# Patient Record
Sex: Female | Born: 1965 | Race: Black or African American | Hispanic: No | Marital: Single | Smoking: Current every day smoker
Health system: Southern US, Community
[De-identification: ages and names within clinical notes are randomized; demographics above are authoritative.]

## PROBLEM LIST (undated history)

## (undated) DIAGNOSIS — F32A Depression, unspecified: Secondary | ICD-10-CM

## (undated) DIAGNOSIS — R42 Dizziness and giddiness: Secondary | ICD-10-CM

## (undated) DIAGNOSIS — G510 Bell's palsy: Secondary | ICD-10-CM

## (undated) DIAGNOSIS — F419 Anxiety disorder, unspecified: Secondary | ICD-10-CM

## (undated) DIAGNOSIS — R9389 Abnormal findings on diagnostic imaging of other specified body structures: Secondary | ICD-10-CM

## (undated) DIAGNOSIS — I639 Cerebral infarction, unspecified: Secondary | ICD-10-CM

## (undated) DIAGNOSIS — D250 Submucous leiomyoma of uterus: Secondary | ICD-10-CM

## (undated) DIAGNOSIS — I10 Essential (primary) hypertension: Secondary | ICD-10-CM

## (undated) DIAGNOSIS — E119 Type 2 diabetes mellitus without complications: Secondary | ICD-10-CM

## (undated) DIAGNOSIS — D219 Benign neoplasm of connective and other soft tissue, unspecified: Secondary | ICD-10-CM

## (undated) DIAGNOSIS — D649 Anemia, unspecified: Secondary | ICD-10-CM

## (undated) DIAGNOSIS — Z8669 Personal history of other diseases of the nervous system and sense organs: Secondary | ICD-10-CM

## (undated) DIAGNOSIS — T7840XA Allergy, unspecified, initial encounter: Secondary | ICD-10-CM

## (undated) DIAGNOSIS — H409 Unspecified glaucoma: Secondary | ICD-10-CM

## (undated) DIAGNOSIS — Z972 Presence of dental prosthetic device (complete) (partial): Secondary | ICD-10-CM

## (undated) HISTORY — DX: Bell's palsy: G51.0

## (undated) HISTORY — DX: Cerebral infarction, unspecified: I63.9

## (undated) HISTORY — PX: BREAST SURGERY: SHX581

## (undated) HISTORY — DX: Unspecified glaucoma: H40.9

## (undated) HISTORY — DX: Essential (primary) hypertension: I10

## (undated) HISTORY — DX: Anemia, unspecified: D64.9

## (undated) HISTORY — DX: Anxiety disorder, unspecified: F41.9

## (undated) HISTORY — PX: REFRACTIVE SURGERY: SHX103

## (undated) HISTORY — PX: PELVIC LAPAROSCOPY: SHX162

## (undated) HISTORY — DX: Allergy, unspecified, initial encounter: T78.40XA

## (undated) HISTORY — PX: BREAST EXCISIONAL BIOPSY: SUR124

## (undated) HISTORY — DX: Benign neoplasm of connective and other soft tissue, unspecified: D21.9

## (undated) HISTORY — DX: Depression, unspecified: F32.A

## (undated) HISTORY — DX: Dizziness and giddiness: R42

---

## 1998-06-30 ENCOUNTER — Other Ambulatory Visit: Admission: RE | Admit: 1998-06-30 | Discharge: 1998-06-30 | Payer: Self-pay | Admitting: Gynecology

## 1999-03-28 ENCOUNTER — Other Ambulatory Visit: Admission: RE | Admit: 1999-03-28 | Discharge: 1999-03-28 | Payer: Self-pay | Admitting: Gynecology

## 2000-07-30 HISTORY — PX: HYSTEROSCOPY: SHX211

## 2000-12-05 ENCOUNTER — Other Ambulatory Visit: Admission: RE | Admit: 2000-12-05 | Discharge: 2000-12-05 | Payer: Self-pay | Admitting: Gynecology

## 2001-01-02 HISTORY — PX: HYSTEROSCOPY WITH MYOMECTOMY: SHX7591

## 2001-01-22 ENCOUNTER — Encounter (INDEPENDENT_AMBULATORY_CARE_PROVIDER_SITE_OTHER): Payer: Self-pay

## 2001-01-22 ENCOUNTER — Ambulatory Visit (HOSPITAL_COMMUNITY): Admission: RE | Admit: 2001-01-22 | Discharge: 2001-01-22 | Payer: Self-pay | Admitting: Gynecology

## 2001-05-02 ENCOUNTER — Emergency Department (HOSPITAL_COMMUNITY): Admission: EM | Admit: 2001-05-02 | Discharge: 2001-05-03 | Payer: Self-pay | Admitting: *Deleted

## 2003-02-17 ENCOUNTER — Other Ambulatory Visit: Admission: RE | Admit: 2003-02-17 | Discharge: 2003-02-17 | Payer: Self-pay | Admitting: Gynecology

## 2003-09-18 ENCOUNTER — Emergency Department (HOSPITAL_COMMUNITY): Admission: EM | Admit: 2003-09-18 | Discharge: 2003-09-18 | Payer: Self-pay | Admitting: Emergency Medicine

## 2004-06-30 ENCOUNTER — Other Ambulatory Visit: Admission: RE | Admit: 2004-06-30 | Discharge: 2004-06-30 | Payer: Self-pay | Admitting: Gynecology

## 2004-08-11 ENCOUNTER — Ambulatory Visit (HOSPITAL_COMMUNITY): Admission: RE | Admit: 2004-08-11 | Discharge: 2004-08-11 | Payer: Self-pay | Admitting: Gynecology

## 2004-10-27 ENCOUNTER — Emergency Department (HOSPITAL_COMMUNITY): Admission: EM | Admit: 2004-10-27 | Discharge: 2004-10-27 | Payer: Self-pay | Admitting: Emergency Medicine

## 2005-07-13 ENCOUNTER — Other Ambulatory Visit: Admission: RE | Admit: 2005-07-13 | Discharge: 2005-07-13 | Payer: Self-pay | Admitting: Gynecology

## 2005-10-28 HISTORY — PX: MYOMECTOMY: SHX85

## 2005-11-09 ENCOUNTER — Inpatient Hospital Stay (HOSPITAL_COMMUNITY): Admission: RE | Admit: 2005-11-09 | Discharge: 2005-11-11 | Payer: Self-pay | Admitting: Gynecology

## 2005-11-09 ENCOUNTER — Encounter (INDEPENDENT_AMBULATORY_CARE_PROVIDER_SITE_OTHER): Payer: Self-pay | Admitting: Specialist

## 2005-11-09 HISTORY — PX: EXPLORATORY LAPAROTOMY: SUR591

## 2007-10-31 ENCOUNTER — Other Ambulatory Visit: Admission: RE | Admit: 2007-10-31 | Discharge: 2007-10-31 | Payer: Self-pay | Admitting: Gynecology

## 2008-11-05 ENCOUNTER — Other Ambulatory Visit: Admission: RE | Admit: 2008-11-05 | Discharge: 2008-11-05 | Payer: Self-pay | Admitting: Gynecology

## 2008-11-05 ENCOUNTER — Encounter: Payer: Self-pay | Admitting: Gynecology

## 2008-11-05 ENCOUNTER — Ambulatory Visit: Payer: Self-pay | Admitting: Gynecology

## 2008-11-12 ENCOUNTER — Ambulatory Visit: Payer: Self-pay | Admitting: Gynecology

## 2008-11-19 ENCOUNTER — Ambulatory Visit (HOSPITAL_COMMUNITY): Admission: RE | Admit: 2008-11-19 | Discharge: 2008-11-19 | Payer: Self-pay | Admitting: Gynecology

## 2008-11-26 ENCOUNTER — Encounter: Admission: RE | Admit: 2008-11-26 | Discharge: 2008-11-26 | Payer: Self-pay | Admitting: Gynecology

## 2009-05-06 ENCOUNTER — Encounter: Admission: RE | Admit: 2009-05-06 | Discharge: 2009-05-06 | Payer: Self-pay | Admitting: Gynecology

## 2009-11-25 ENCOUNTER — Encounter: Admission: RE | Admit: 2009-11-25 | Discharge: 2009-11-25 | Payer: Self-pay | Admitting: Gynecology

## 2009-12-16 ENCOUNTER — Ambulatory Visit: Payer: Self-pay | Admitting: Gynecology

## 2009-12-16 ENCOUNTER — Other Ambulatory Visit: Admission: RE | Admit: 2009-12-16 | Discharge: 2009-12-16 | Payer: Self-pay | Admitting: Gynecology

## 2010-05-02 ENCOUNTER — Encounter: Admission: RE | Admit: 2010-05-02 | Discharge: 2010-05-02 | Payer: Self-pay | Admitting: Gynecology

## 2010-10-23 ENCOUNTER — Other Ambulatory Visit: Payer: Self-pay | Admitting: Gynecology

## 2010-10-23 DIAGNOSIS — Z09 Encounter for follow-up examination after completed treatment for conditions other than malignant neoplasm: Secondary | ICD-10-CM

## 2010-12-15 NOTE — H&P (Signed)
[facility]  Patient:    PRABHJOT, MADDUX                     MRN: [id number] Adm. Date:  01/22/01 Attending:  Marcial Pacas P. Fontaine, M.D.                         History and Physical  CHIEF COMPLAINT:              Menorrhagia.  HISTORY OF PRESENT ILLNESS:   [age]-year-old G1, P60 female with history of heavy periods.  She notes that she flows for five days each month with frequent tampon changes and she underwent ultrasound with sonohysterogram, Dec 25, 2000, which showed multiple small myomas and endometrial cavity with some fluid within outlining submucous myomas changes within the cavity. Patient is admitted at this time for sonohysterogram and resection of any intracavitary defects.  PAST MEDICAL HISTORY:         Uncomplicated.  PAST SURGICAL HISTORY:        Uncomplicated.  ALLERGIES:                    No medications.  CURRENT MEDICATIONS:          None.  FAMILY HISTORY:               Noncontributory.  SOCIAL HISTORY:               Cigarettes:  One-half pack per day.  Alcohol: None.  PHYSICAL EXAMINATION:  HEENT:                        Normal.  LUNGS:                        Clear.  CARDIAC:                      Regular rate without rubs, murmurs or gallops.  ABDOMEN:                      Exam benign.  PELVIC:                       External, BUS, vagina normal.  Cervix normal. Uterus retroverted, grossly normal in size, nodular.  Adnexa without masses or tenderness.  ASSESSMENT:                   [age]-year-old gravida 1, para 0 female, history of leiomyomata, with multiple small intramural myomas with menorrhagia and ultrasound revealing fluid within the cavity which outlines irregularities consistent with submucous myomas.  Patient is admitted for hysteroscopy and resection of any intracavitary defects.  I have reviewed with the patient what is involved with hysteroscopy to include instrumentation and the risks of  the procedure to include uterine perforation, inadvertent injury to internal organs including bowel, bladder, ureters, vessels and nerves necessitating major exploratory reparative surgeries and future reparative surgeries including ostomy formation.  The risk of a hemorrhage necessitating transfusion and the risks of transfusion were discussed with her.  The risks of infection were also reviewed with her.  Patient understands that we are not removing all of her fibroids but only those areas that protrude into the cavity and she understands and accepts this.  The patients questions were answered to her satisfaction and she is ready to proceed  with surgery. DD:  01/20/01 TD:  01/20/01 Job: [id number]

## 2010-12-15 NOTE — Op Note (Signed)
[facility]  Patient:    KYLAH, MARESH                     MRN: [id number] Proc. Date: 01/22/01 Attending:  Marcial Pacas P. Fontaine, M.D.                           Operative Report  PREOPERATIVE DIAGNOSIS:       Leiomyomata.  POSTOPERATIVE DIAGNOSIS:      1. Leiomyomata.                               2. Questionable polyp.  OPERATION:                    Hysteroscopic myomectomy.  SURGEON:                      Timothy P. Fontaine, M.D.  ANESTHESIA:                   General.  ESTIMATED BLOOD LOSS:         Minimal  COMPLICATIONS:                None.  SORBITOL DISCREPANCY:       Less than 150 cc.  FINDINGS:                     Small pedunculated myoma, left anterior lower cavity, excised entirely.  Endometrial distortion from submucous myoma, mid anterior wall, excised to the level of the surrounding endometrium.  Small polypoid growth, right endometrial wall, excised.  Cavity otherwise normal. Right and left tubal ostia visualized.  Lower uterine segment and cervical canal all normal.  DESCRIPTION OF PROCEDURE:     The patient was taken to the operating room and initially underwent IV sedation.  She was unable to tolerate the vaginal preparation and subsequently underwent general anesthesia.  The patient subsequently underwent vaginal peritoneal preparation with Betadine solution. Bladder emptied with in and out Foley catheterization.  Examination under anesthesia performed.    The patient was draped in the usual fashion. The cervix was visualized with surgical speculum, grasped on the anterior lip with a single-tooth tenaculum and gently dilated to admit the operative resectoscope.  Hysteroscopy was performed with findings noted above.   The pedunculated myoma was removed with a single pass of the right angle resectoscopic loop and sent to pathology.  The bulging submucous myoma anteriorly was addressed, and, through several passes, the  superficial portion of the myoma encroaching upon the cavity was resected to the level of the surrounding endometrium.  There was no further protuberance of the underlying myoma, and further resection was abandoned as would requireentry into the myometrial wall.  The polypoid endometrial protuberance on the right cavity was then removed and sent separately to pathology.  The hysteroscope was then removed.  The sharp curettage performed.  The endometrium retrieved and sent to pathology.  Repeat hysteroscopy showed andempty cavity, good distension, and no evidence of perforation, and adequate hemostasis.  The instruments were then removed.  Adequate hemostasis was visualized.  The patient was placed in the supine position and awakened without difficulty and taken to the recovery room in good condition having tolerated the procedure well. DD:  01/22/01 TD:  01/22/01 Job: [id number]

## 2010-12-15 NOTE — Op Note (Signed)
NAMECRUZITA, LIPA            ACCOUNT NO.:  [id number]   MEDICAL RECORD NO.:  [id number]          PATIENT TYPE:  INP   LOCATION:  9399                          FACILITY:  [facility]   PHYSICIAN:  Timothy P. Fontaine, M.D.DATE OF BIRTH:  04/19/1966   DATE OF PROCEDURE:  11/09/2005  DATE OF DISCHARGE:                                 OPERATIVE REPORT   PREOPERATIVE DIAGNOSIS:  Multiple myomas.   POSTOPERATIVE DIAGNOSIS:  Multiple myomas.   PROCEDURE:  Exploratory laparotomy, multiple myomectomy.   SURGEON:  Timothy P. Fontaine, M.D.   ASSISTANT:  Rande Brunt. Eda Paschal, M.D.   ANESTHETIC:  General.   ESTIMATED BLOOD LOSS:  175 mL.   COMPLICATIONS:  None.   SPECIMEN:  Multiple myomas, number 13.   FINDINGS:  Uterus grossly enlarged approximately 14 weeks size with multiple  myomas intramural subserosal ranging in size from 5 cm to several  millimeters.  Number 13 were removed in total.  No remaining myomas noted.  Right and left fallopian tubes are normal length, caliber, fimbriated ends  free and mobile.  Right and left ovaries grossly normal free and mobile.  No  evidence of the pelvic adhesions or endometriosis.  Palpable upper abdominal  exam was noted to be normal.  The endometrial cavity was not entered during  the case. Surgeons opinion that the patient should have subsequent cesarean  section with delivery if pregnancy is achieved.   PROCEDURE:  The patient was taken to the operating room, underwent general  endotracheal anesthesia, received abdominal perineal vaginal preparation  with Betadine solution.  The bladder emptied with indwelling Foley catheter  placed in sterile technique.  The patient is a latex allergic and latex  precautions were used.  The patient was draped in usual fashion.  The  abdomen sharply entered through a Pfannenstiel incision achieving adequate  hemostasis at all levels.  Balfour retractor and bladder blade were placed  within the incision and  the intestines were packed from the operative field.  The uterus was elevated and using a mixture of vasopressin 20 units per 50  mL of normal saline, the uterus was injected around all myoma sites a total  of 20 mL.  There were two large fundal subserosal myomas noted and a  transverse fundal incision was made to remove both of these myomas.  Subsequently, a lower uterine segment posterior myoma was also noted with  several smaller surrounding myomas.  A subsequent longitudinal lower uterine  segment posterior incision was made overlying the myoma and this myoma was  removed as well as the surrounding a smaller myomas.  A third smaller  anterior fundal incision was made overlying a 2 cm myoma as it was not  retrievable from either of the prior two incisions.  Of note tubal  insertions bilaterally were not compromised and there were several myomas  surrounding the tubal insertions that could explain extrinsic tubal  compression.  The uterine incisions were then closed initially with 0 Vicryl  interrupted figure-of-eight myometrial sutures and subsequently, the serosa  was reapproximated using 3-0 PDS in a running subserosal stitch bearing the  sutures.  The pelvis was copiously irrigated.  Adequate hemostasis was  achieved, bowel packing removed.  Balfour retractor bladder blade removed.  The anterior peritoneum was reapproximated using 3-0 Vicryl running stitch.  The subcutaneous tissues were irrigated out, good hemostasis visualized and  the fascia was reapproximated using 0 Vicryl suture starting at the angle,  meeting in the middle.  Electrocautery was used for subcutaneous hemostasis.  Skin was then reapproximated using 4-0 Vicryl running subcuticular stitch.  Steri-Strips, Benzoin applied.  The patient awakened without difficulty,  taken to recovery room in good condition having tolerated the procedure  well.  The dye study was not performed during the procedure given there was   evidence of obstruction preoperatively bilaterally it was felt not to  contribute ultimately.      Timothy P. Fontaine, M.D.  Electronically Signed     TPF/MEDQ  D:  11/09/2005  T:  11/09/2005  Job:  [id number]

## 2010-12-15 NOTE — H&P (Signed)
Brandi Cooke, Brandi Cooke            ACCOUNT NO.:  [id number]   MEDICAL RECORD NO.:  [id number]          PATIENT TYPE:  INP   LOCATION:  NA                            FACILITY:  [facility]   PHYSICIAN:  Timothy P. Fontaine, M.D.DATE OF BIRTH:  04/25/1966   DATE OF ADMISSION:  11/10/2003  DATE OF DISCHARGE:                                HISTORY & PHYSICAL   CHIEF COMPLAINT:  Menorrhagia with dysmenorrhea.   HISTORY OF PRESENT ILLNESS:  A 45 year old, G1, P32 female with increasing  menorrhagia and dysmenorrhea.  The patient has been followed for history of  leiomyomata and has worsening symptoms in relationship to her bleeding and  cramping.  She has been attempting pregnancy for the past several years.  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  The patient is admitted at this time for myomectomy due to her  menorrhagia and dysmenorrhea.   PAST MEDICAL HISTORY:  Uncomplicated.   PAST SURGICAL HISTORY:  Hysteroscopic myomectomy.   ALLERGIES:  No known drug allergies.   CURRENT MEDICATIONS:  None.   REVIEW OF SYSTEMS:  Noncontributory.   FAMILY HISTORY:  Noncontributory.   SOCIAL HISTORY:  Noncontributory.   PHYSICAL EXAMINATION:  VITAL SIGNS:  Afebrile.  Vital signs stable.  HEENT:  Normal.  LUNGS:  Clear.  CARDIAC:  Regular rate without rubs, murmurs or gallops.  ABDOMEN:  Abdominal exam is benign.  PELVIC:  External BUS and vagina normal.  Cervix normal.  Uterus bulky  retroverted consistent with myomas.  Adnexa without gross masses or  tenderness.   ASSESSMENT:  A 45 year old, G1, P24 female with history of leiomyomata with  increasing menorrhagia and dysmenorrhea for multiple myomectomy.  Risks  benefits, indications and alternatives for the proposed surgery were  reviewed with her to include the options of continued expectant management,  multiple myomectomy, hysterectomy as well as uterine artery embolization.   PLAN:  The patient has  been trying to get pregnant, although she understands  that she has bilateral tubal occlusion.  The options for referral to  reproductive endocrinologist to consider in vitro versus other surgery such  as multiple myomectomy, tuboplasty were reviewed and declined.  The patient  understands by performing myomectomy that she is at risk for redevelopment  of myomas in the future.  She does have a posterior lower uterine segment  myoma and she understands that I may not be able to remove all the myomas at  the time of the myomectomy safely.  She also understands that if  complications arise during the procedure, that I  may have to proceed with  hysterectomy and she has a realistic understanding that I may have to  perform a hysterectomy at the time of her myomectomy.  She again understands  that this is not being performed for fertility, that she will continue to  have tubal occlusion following the procedure and that we are addressing her  myomas for her menorrhagia and dysmenorrhea standpoint and she understands  and accepts this.  The acute intraoperative, postoperative risks of the  surgery were reviewed with her to include the risks of  bleeding, hemorrhage  and transfusion and the risks of transfusion including transfusion reaction,  hepatitis, HIV, mad cow disease and other unknown entities.  The risks of  infection both internal requiring prolonged antibiotics as well as  incisional, requiring opening and draining of incisions, closure by  secondary intention and other wound complications were all discussed,  understood and accepted.  The risks of inadvertent injury to internal organs  including bowel, bladder, ureters, vessels and nerves necessitating major  exploratory reparative surgeries in the future with reparative surgeries  including ostomy formation was all discussed, understood, accepted.  The  patient understands that if she does achieve pregnancy following the  procedure in  the future, either spontaneously or through assistance, that  due to the myomectomy, she is at risk for subsequent pregnancies from a  uterine rupture standpoint as well as probably requiring cesarean section  delivery.  She also understands the scarring due to the myomectomy may also  further impair fertility and she understands and accepts this.  The  patient's questions were answered to her satisfaction.  She is ready to  proceed with surgery.      Timothy P. Fontaine, M.D.  Electronically Signed     TPF/MEDQ  D:  10/26/2005  T:  10/27/2005  Job:  [id number]

## 2012-05-27 ENCOUNTER — Encounter: Payer: Self-pay | Admitting: Gynecology

## 2012-05-27 DIAGNOSIS — H409 Unspecified glaucoma: Secondary | ICD-10-CM | POA: Insufficient documentation

## 2012-05-30 ENCOUNTER — Other Ambulatory Visit (HOSPITAL_COMMUNITY)
Admission: RE | Admit: 2012-05-30 | Discharge: 2012-05-30 | Disposition: A | Discharge status: Home / Self Care | Payer: Medicaid Other | Source: Ambulatory Visit | Attending: Gynecology | Admitting: Gynecology

## 2012-05-30 ENCOUNTER — Ambulatory Visit (INDEPENDENT_AMBULATORY_CARE_PROVIDER_SITE_OTHER): Payer: Medicaid Other | Admitting: Gynecology

## 2012-05-30 ENCOUNTER — Encounter: Payer: Self-pay | Admitting: Gynecology

## 2012-05-30 VITALS — BP 124/76 | Ht 65.0 in | Wt 140.0 lb

## 2012-05-30 DIAGNOSIS — Z1151 Encounter for screening for human papillomavirus (HPV): Secondary | ICD-10-CM | POA: Insufficient documentation

## 2012-05-30 DIAGNOSIS — D259 Leiomyoma of uterus, unspecified: Secondary | ICD-10-CM

## 2012-05-30 DIAGNOSIS — E010 Iodine-deficiency related diffuse (endemic) goiter: Secondary | ICD-10-CM

## 2012-05-30 DIAGNOSIS — Z01419 Encounter for gynecological examination (general) (routine) without abnormal findings: Secondary | ICD-10-CM | POA: Insufficient documentation

## 2012-05-30 DIAGNOSIS — G510 Bell's palsy: Secondary | ICD-10-CM | POA: Insufficient documentation

## 2012-05-30 DIAGNOSIS — E049 Nontoxic goiter, unspecified: Secondary | ICD-10-CM

## 2012-05-30 DIAGNOSIS — Z124 Encounter for screening for malignant neoplasm of cervix: Secondary | ICD-10-CM

## 2012-05-30 DIAGNOSIS — D219 Benign neoplasm of connective and other soft tissue, unspecified: Secondary | ICD-10-CM | POA: Insufficient documentation

## 2012-05-30 NOTE — Patient Instructions (Addendum)
Follow up for ultrasound  Call to Schedule your mammogram  Facilities in [place]: 1)  [facility] [place], [place] Phone: [phone] 2)  [facility]. [facility], [phone] Phone: [phone] 3)  Dr. Yolanda Bonine at [facility] [address] Phone: [phone]     Mammogram A mammogram is an X-ray test to find changes in a woman's breast. You should get a mammogram if:  You are 86 years of age or older  You have risk factors.   Your doctor recommends that you have one.  BEFORE THE TEST  Do not schedule the test the week before your period, especially if your breasts are sore during this time.  On the day of your mammogram:  Wash your breasts and armpits well. After washing, do not put on any deodorant or talcum powder on until after your test.   Eat and drink as you usually do.   Take your medicines as usual.   If you are diabetic and take insulin, make sure you:   Eat before coming for your test.   Take your insulin as usual.   If you cannot keep your appointment, call before the appointment to cancel. Schedule another appointment.  TEST  You will need to undress from the waist up. You will put on a hospital gown.   Your breast will be put on the mammogram machine, and it will press firmly on your breast with a piece of plastic called a compression paddle. This will make your breast flatter so that the machine can X-ray all parts of your breast.   Both breasts will be X-rayed. Each breast will be X-rayed from above and from the side. An X-ray might need to be taken again if the picture is not good enough.   The mammogram will last about 15 to 30 minutes.  AFTER THE TEST Finding out the results of your test Ask when your test results will be ready. Make sure you get your test results.  Document Released: 10/12/2008 Document Revised: 07/05/2011 Document Reviewed:  10/12/2008 [facility] Patient Information 2012 [place], [place].   Consider Stop Smoking.  Help is available at [facility] smoking cessation program @ www.[place].com or 403 701 8283. OR 1-800-QUIT-NOW 513-758-3585) for free smoking cessation counseling.  Smokefree.gov (http://www.[place].com/) provides free, accurate, evidence-based information and professional assistance to help support the immediate and long-term needs of people trying to quit smoking.    Smoking Hazards Smoking cigarettes is extremely bad for your health. Tobacco smoke has over 200 known poisons in it. There are over 60 chemicals in tobacco smoke that cause cancer. Some of the chemicals found in cigarette smoke include:  Cyanide.  Benzene.  Formaldehyde.  Methanol (wood alcohol).  Acetylene (fuel used in welding torches).  Ammonia.  Cigarette smoke also contains the poisonous gases nitrogen oxide and carbon monoxide.  Cigarette smokers have an increased risk of many serious medical problems, including: Lung cancer.  Lung disease (such as pneumonia, bronchitis, and emphysema).  Heart attack and chest pain due to the heart not getting enough oxygen (angina).  Heart disease and peripheral blood vessel disease.  Hypertension.  Stroke.  Oral cancer (cancer of the lip, mouth, or voice box).  Bladder cancer.  Pancreatic cancer.  Cervical cancer.  Pregnancy complications, including premature birth.  Low birthweight babies.  Early menopause.  Lower estrogen level for women.  Infertility.  Facial wrinkles.  Blindness.  Increased risk of  broken bones (fractures).  Senile dementia.  Stillbirths and smaller newborn babies, birth defects, and genetic damage to sperm.  Stomach ulcers and internal bleeding.  Children of smokers have an increased risk of the following, because of secondhand smoke exposure:  Sudden infant death syndrome (SIDS).  Respiratory infections.  Lung cancer.  Heart disease.  Ear  infections.  Smoking causes approximately: 90% of all lung cancer deaths in men.  80% of all lung cancer deaths in women.  90% of deaths from chronic obstructive lung disease.  Compared with nonsmokers, smoking increases the risk of: Coronary heart disease by 2 to 4 times.  Stroke by 2 to 4 times.  Men developing lung cancer by 23 times.  Women developing lung cancer by 13 times.  Dying from chronic obstructive lung diseases by 12 times.  Someone who smokes 2 packs a day loses about 8 years of his or her life. Even smoking lightly shortens your life expectancy by several years. You can greatly reduce the risk of medical problems for you and your family by stopping now. Smoking is the most preventable cause of death and disease in our society. Within days of quitting smoking, your circulation returns to normal, you decrease the risk of having a heart attack, and your lung capacity improves. There may be some increased phlegm in the first few days after quitting, and it may take months for your lungs to clear up completely. Quitting for 10 years cuts your lung cancer risk to almost that of a nonsmoker. WHY IS SMOKING ADDICTIVE? Nicotine is the chemical agent in tobacco that is capable of causing addiction or dependence.  When you smoke and inhale, nicotine is absorbed rapidly into the bloodstream through your lungs. Nicotine absorbed through the lungs is capable of creating a powerful addiction. Both inhaled and non-inhaled nicotine may be addictive.  Addiction studies of cigarettes and spit tobacco show that addiction to nicotine occurs mainly during the teen years, when young people begin using tobacco products.  WHAT ARE THE BENEFITS OF QUITTING?  There are many health benefits to quitting smoking.  Likelihood of developing cancer and heart disease decreases. Health improvements are seen almost immediately.  Blood pressure, pulse rate, and breathing patterns start returning to normal soon after  quitting.  People who quit may see an improvement in their overall quality of life.  Some people choose to quit all at once. Other options include nicotine replacement products, such as patches, gum, and nasal sprays. Do not use these products without first checking with your caregiver. QUITTING SMOKING It is not easy to quit smoking. Nicotine is addicting, and longtime habits are hard to change. To start, you can write down all your reasons for quitting, tell your family and friends you want to quit, and ask for their help. Throw your cigarettes away, chew gum or cinnamon sticks, keep your hands busy, and drink extra water or juice. Go for walks and practice deep breathing to relax. Think of all the money you are saving: around $1,000 a year, for the average pack-a-day smoker. Nicotine patches and gum have been shown to improve success at efforts to stop smoking. Zyban (bupropion) is an anti-depressant drug that can be prescribed to reduce nicotine withdrawal symptoms and to suppress the urge to smoke. Smoking is an addiction with both physical and psychological effects. Joining a stop-smoking support group can help you cope with the emotional issues. For more information and advice on programs to stop smoking, call your doctor, your local  hospital, or these organizations: American Lung Association - 1-800-LUNGUSA   Smoking Cessation  This document explains the best ways for you to quit smoking and new treatments to help. It lists new medicines that can double or triple your chances of quitting and quitting for good. It also considers ways to avoid relapses and concerns you may have about quitting, including weight gain. NICOTINE: A POWERFUL ADDICTION If you have tried to quit smoking, you know how hard it can be. It is hard because nicotine is a very addictive drug. For some people, it can be as addictive as heroin or cocaine. Usually, people make 2 or 3 tries, or more, before finally being able to  quit. Each time you try to quit, you can learn about what helps and what hurts. Quitting takes hard work and a lot of effort, but you can quit smoking. QUITTING SMOKING IS ONE OF THE MOST IMPORTANT THINGS YOU WILL EVER DO.  You will live longer, feel better, and live better.   The impact on your body of quitting smoking is felt almost immediately:   Within 20 minutes, blood pressure decreases. Pulse returns to its normal level.   After 8 hours, carbon monoxide levels in the blood return to normal. Oxygen level increases.   After 24 hours, chance of heart attack starts to decrease. Breath, hair, and body stop smelling like smoke.   After 48 hours, damaged nerve endings begin to recover. Sense of taste and smell improve.   After 72 hours, the body is virtually free of nicotine. Bronchial tubes relax and breathing becomes easier.   After 2 to 12 weeks, lungs can hold more air. Exercise becomes easier and circulation improves.   Quitting will reduce your risk of having a heart attack, stroke, cancer, or lung disease:   After 1 year, the risk of coronary heart disease is cut in half.   After 5 years, the risk of stroke falls to the same as a nonsmoker.   After 10 years, the risk of lung cancer is cut in half and the risk of other cancers decreases significantly.   After 15 years, the risk of coronary heart disease drops, usually to the level of a nonsmoker.   If you are pregnant, quitting smoking will improve your chances of having a healthy baby.   The people you live with, especially your children, will be healthier.   You will have extra money to spend on things other than cigarettes.  FIVE KEYS TO QUITTING Studies have shown that these 5 steps will help you quit smoking and quit for good. You have the best chances of quitting if you use them together: 1. Get ready.  2. Get support and encouragement.  3. Learn new skills and behaviors.  4. Get medicine to reduce your nicotine  addiction and use it correctly.  5. Be prepared for relapse or difficult situations. Be determined to continue trying to quit, even if you do not succeed at first.  1. GET READY  Set a quit date.   Change your environment.   Get rid of ALL cigarettes, ashtrays, matches, and lighters in your home, car, and place of work.   Do not let people smoke in your home.   Review your past attempts to quit. Think about what worked and what did not.   Once you quit, do not smoke. NOT EVEN A PUFF!  2. GET SUPPORT AND ENCOURAGEMENT Studies have shown that you have a better chance of being successful  if you have help. You can get support in many ways.  Tell your family, friends, and coworkers that you are going to quit and need their support. Ask them not to smoke around you.   Talk to your caregivers (doctor, dentist, nurse, pharmacist, psychologist, and/or smoking counselor).   Get individual, group, or telephone counseling and support. The more counseling you have, the better your chances are of quitting. Programs are available at [employer] and health centers. Call your local health department for information about programs in your area.   Spiritual beliefs and practices may help some smokers quit.   Quit meters are Photographer that keep track of quit statistics, such as amount of "quit-time," cigarettes not smoked, and money saved.   Many smokers find one or more of the many self-help books available useful in helping them quit and stay off tobacco.  3. LEARN NEW SKILLS AND BEHAVIORS  Try to distract yourself from urges to smoke. Talk to someone, go for a walk, or occupy your time with a task.   When you first try to quit, change your routine. Take a different route to work. Drink tea instead of coffee. Eat breakfast in a different place.   Do something to reduce your stress. Take a hot bath, exercise, or read a book.   Plan something enjoyable to do  every day. Reward yourself for not smoking.   Explore interactive web-based programs that specialize in helping you quit.  4. GET MEDICINE AND USE IT CORRECTLY Medicines can help you stop smoking and decrease the urge to smoke. Combining medicine with the above behavioral methods and support can quadruple your chances of successfully quitting smoking. The U.S. Food and Drug Administration (FDA) has approved 7 medicines to help you quit smoking. These medicines fall into 3 categories.  Nicotine replacement therapy (delivers nicotine to your body without the negative effects and risks of smoking):   Nicotine gum: Available over-the-counter.   Nicotine lozenges: Available over-the-counter.   Nicotine inhaler: Available by prescription.   Nicotine nasal spray: Available by prescription.   Nicotine skin patches (transdermal): Available by prescription and over-the-counter.   Antidepressant medicine (helps people abstain from smoking, but how this works is unknown):   Bupropion sustained-release (SR) tablets: Available by prescription.   Nicotinic receptor partial agonist (simulates the effect of nicotine in your brain):   Varenicline tartrate tablets: Available by prescription.   Ask your caregiver for advice about which medicines to use and how to use them. Carefully read the information on the package.   Everyone who is trying to quit may benefit from using a medicine. If you are pregnant or trying to become pregnant, nursing an infant, you are under age 26, or you smoke fewer than 10 cigarettes per day, talk to your caregiver before taking any nicotine replacement medicines.   You should stop using a nicotine replacement product and call your caregiver if you experience nausea, dizziness, weakness, vomiting, fast or irregular heartbeat, mouth problems with the lozenge or gum, or redness or swelling of the skin around the patch that does not go away.   Do not use any other product  containing nicotine while using a nicotine replacement product.   Talk to your caregiver before using these products if you have diabetes, heart disease, asthma, stomach ulcers, you had a recent heart attack, you have high blood pressure that is not controlled with medicine, a history of irregular heartbeat, or you have been prescribed  medicine to help you quit smoking.  5. BE PREPARED FOR RELAPSE OR DIFFICULT SITUATIONS  Most relapses occur within the first 3 months after quitting. Do not be discouraged if you start smoking again. Remember, most people try several times before they finally quit.   You may have symptoms of withdrawal because your body is used to nicotine. You may crave cigarettes, be irritable, feel very hungry, cough often, get headaches, or have difficulty concentrating.   The withdrawal symptoms are only temporary. They are strongest when you first quit, but they will go away within 10 to 14 days.  Here are some difficult situations to watch for:  Alcohol. Avoid drinking alcohol. Drinking lowers your chances of successfully quitting.   Caffeine. Try to reduce the amount of caffeine you consume. It also lowers your chances of successfully quitting.   Other smokers. Being around smoking can make you want to smoke. Avoid smokers.   Weight gain. Many smokers will gain weight when they quit, usually less than 10 pounds. Eat a healthy diet and stay active. Do not let weight gain distract you from your main goal, quitting smoking. Some medicines that help you quit smoking may also help delay weight gain. You can always lose the weight gained after you quit.   Bad mood or depression. There are a lot of ways to improve your mood other than smoking.  If you are having problems with any of these situations, talk to your caregiver. SPECIAL SITUATIONS AND CONDITIONS Studies suggest that everyone can quit smoking. Your situation or condition can give you a special reason to  quit.  Pregnant women/new mothers: By quitting, you protect your baby's health and your own.   Hospitalized patients: By quitting, you reduce health problems and help healing.   Heart attack patients: By quitting, you reduce your risk of a second heart attack.   Lung, head, and neck cancer patients: By quitting, you reduce your chance of a second cancer.   Parents of children and adolescents: By quitting, you protect your children from illnesses caused by secondhand smoke.  QUESTIONS TO THINK ABOUT Think about the following questions before you try to stop smoking. You may want to talk about your answers with your caregiver.  Why do you want to quit?   If you tried to quit in the past, what helped and what did not?   What will be the most difficult situations for you after you quit? How will you plan to handle them?   Who can help you through the tough times? Your family? Friends? Caregiver?   What pleasures do you get from smoking? What ways can you still get pleasure if you quit?  Here are some questions to ask your caregiver:  How can you help me to be successful at quitting?   What medicine do you think would be best for me and how should I take it?   What should I do if I need more help?   What is smoking withdrawal like? How can I get information on withdrawal?  Quitting takes hard work and a lot of effort, but you can quit smoking.

## 2012-05-30 NOTE — Progress Notes (Signed)
Brandi Cooke 1966/04/13 [id number]        46 y.o.  G1P1001 for follow up exam.    Past medical history,surgical history, medications, allergies, family history and social history were all reviewed and documented in the EPIC chart. ROS:  Was performed and pertinent positives and negatives are included in the history.  Exam: Kim assistant Filed Vitals:   05/30/12 1603  BP: 124/76  Height: 5\' 5"  (1.651 m)  Weight: 140 lb (63.504 kg)   General appearance  Normal Skin grossly normal Head/Neck normal with no cervical or supraclavicular adenopathy thyroid normal Lungs  clear Cardiac RR, without RMG Abdominal  soft, nontender, without masses, organomegaly or hernia Breasts  examined lying and sitting without masses, retractions, discharge or axillary adenopathy. Pelvic  Ext/BUS/vagina  normal   Cervix  normal Pap/HPV  Uterus  Retroverted globoid approximately 10 weeks size consistent with leiomyoma, nontender  Adnexa  Without masses or tenderness    Anus and perineum  normal   Rectovaginal  normal sphincter tone without palpated masses or tenderness.    Assessment/Plan:  46 y.o. G1P1001 female   1. History leiomyoma.  Questionable growth as it feels little larger. We'll go ahead and check baseline ultrasound now and patient will follow up for this. She still has fairly heavy periods with cramping. 2. Contraception. The patient had been wanting pregnancy. 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 Never followed up for HSG subsequently as discussed. Suspect tubal blockage. Understands possibility of pregnancy spontaneously and accepts this. 3. Generous thyroid. Patient has some mild thyroid prominence symmetrical without palpable abnormalities. I think this is normal but I want to check her baseline thyroid functions and we will check T3-T4 TSH. 4. Pap smear. Has not had a Pap smear in several years. Pap/HPV done. Assuming negative then  plan every 3-5 year screening. 5. Mammography. Patient's overdo and knows the need to schedule a mammogram and agrees to do so. SBE monthly reviewed. Does have a history of some changes being followed on her mammogram and the importance of follow up. 6. Smoking. Stop smoking again discussed and encouraged with her. 7. Health maintenance. Patient understands the need to see a primary physician for routine health care and routine lab work but agrees to arrange this.  Follow up for ultrasound and lab work drawn here.    Brandi Lords MD, 5:04 PM 05/30/2012

## 2012-05-31 LAB — URINALYSIS W MICROSCOPIC + REFLEX CULTURE
Bilirubin Urine: NEGATIVE
Casts: NONE SEEN
Crystals: NONE SEEN
Ketones, ur: NEGATIVE mg/dL
Specific Gravity, Urine: 1.01 (ref 1.005–1.030)
pH: 7 (ref 5.0–8.0)

## 2012-05-31 LAB — CBC WITH DIFFERENTIAL/PLATELET
Basophils Absolute: 0.1 10*3/uL (ref 0.0–0.1)
Eosinophils Relative: 2 % (ref 0–5)
HCT: 40 % (ref 36.0–46.0)
Lymphocytes Relative: 47 % — ABNORMAL HIGH (ref 12–46)
Lymphs Abs: 3 10*3/uL (ref 0.7–4.0)
MCV: 80.8 fL (ref 78.0–100.0)
Monocytes Absolute: 0.4 10*3/uL (ref 0.1–1.0)
RDW: 20.5 % — ABNORMAL HIGH (ref 11.5–15.5)
WBC: 6.3 10*3/uL (ref 4.0–10.5)

## 2012-06-02 ENCOUNTER — Telehealth: Payer: Self-pay | Admitting: Gynecology

## 2012-06-02 ENCOUNTER — Encounter: Payer: Self-pay | Admitting: Gynecology

## 2012-06-02 MED ORDER — SULFAMETHOXAZOLE-TRIMETHOPRIM 800-160 MG PO TABS: 1.0000 | ORAL_TABLET | Freq: Two times a day (BID) | ORAL | Status: DC

## 2012-06-02 NOTE — Telephone Encounter (Signed)
Pt informed with the below note. 

## 2012-06-02 NOTE — Telephone Encounter (Signed)
Left message for pt to call.

## 2012-06-02 NOTE — Telephone Encounter (Signed)
Tell patient her urine shows evidence of UTI. I want to treat her with Septra DS one by mouth twice a day x7 days

## 2012-06-06 ENCOUNTER — Other Ambulatory Visit: Payer: Self-pay | Admitting: *Deleted

## 2012-06-06 DIAGNOSIS — N63 Unspecified lump in unspecified breast: Secondary | ICD-10-CM

## 2012-06-27 ENCOUNTER — Ambulatory Visit
Admission: RE | Admit: 2012-06-27 | Discharge: 2012-06-27 | Disposition: A | Discharge status: Home / Self Care | Payer: Medicaid Other | Source: Ambulatory Visit | Attending: Gynecology | Admitting: Gynecology

## 2012-06-27 DIAGNOSIS — N63 Unspecified lump in unspecified breast: Secondary | ICD-10-CM

## 2012-07-03 ENCOUNTER — Other Ambulatory Visit: Payer: Medicaid Other

## 2012-07-03 ENCOUNTER — Ambulatory Visit: Payer: Medicaid Other | Admitting: Gynecology

## 2013-06-18 ENCOUNTER — Encounter: Payer: Self-pay | Admitting: Gynecology

## 2013-06-18 ENCOUNTER — Ambulatory Visit (INDEPENDENT_AMBULATORY_CARE_PROVIDER_SITE_OTHER): Payer: Medicaid Other | Admitting: Gynecology

## 2013-06-18 VITALS — BP 120/78 | Ht 65.0 in | Wt 153.0 lb

## 2013-06-18 DIAGNOSIS — D251 Intramural leiomyoma of uterus: Secondary | ICD-10-CM

## 2013-06-18 DIAGNOSIS — Z308 Encounter for other contraceptive management: Secondary | ICD-10-CM

## 2013-06-18 DIAGNOSIS — N920 Excessive and frequent menstruation with regular cycle: Secondary | ICD-10-CM

## 2013-06-18 DIAGNOSIS — N946 Dysmenorrhea, unspecified: Secondary | ICD-10-CM

## 2013-06-18 DIAGNOSIS — J029 Acute pharyngitis, unspecified: Secondary | ICD-10-CM

## 2013-06-18 LAB — CBC WITH DIFFERENTIAL/PLATELET
Eosinophils Absolute: 0.1 10*3/uL (ref 0.0–0.7)
Eosinophils Relative: 3 % (ref 0–5)
Hemoglobin: 10.9 g/dL — ABNORMAL LOW (ref 12.0–15.0)
Lymphs Abs: 2 10*3/uL (ref 0.7–4.0)
MCH: 25.3 pg — ABNORMAL LOW (ref 26.0–34.0)
MCV: 78.8 fL (ref 78.0–100.0)
Monocytes Relative: 9 % (ref 3–12)
RBC: 4.3 MIL/uL (ref 3.87–5.11)

## 2013-06-18 MED ORDER — IBUPROFEN 800 MG PO TABS: 800.0000 mg | ORAL_TABLET | Freq: Three times a day (TID) | ORAL | Status: DC | PRN

## 2013-06-18 NOTE — Patient Instructions (Addendum)
Establish care with a primary physician to allow for routine blood work and healthcare. Followup with me in one year for annual exam.  Consider Stop Smoking.  Help is available at [facility] smoking cessation program @ www.[place].com or 580 460 5371. OR 1-800-QUIT-NOW 636-072-4870) for free smoking cessation counseling.  Smokefree.gov (http://www.[place].com/) provides free, accurate, evidence-based information and professional assistance to help support the immediate and long-term needs of people trying to quit smoking.    Smoking Hazards Smoking cigarettes is extremely bad for your health. Tobacco smoke has over 200 known poisons in it. There are over 60 chemicals in tobacco smoke that cause cancer. Some of the chemicals found in cigarette smoke include:  Cyanide.  Benzene.  Formaldehyde.  Methanol (wood alcohol).  Acetylene (fuel used in welding torches).  Ammonia.  Cigarette smoke also contains the poisonous gases nitrogen oxide and carbon monoxide.  Cigarette smokers have an increased risk of many serious medical problems, including: Lung cancer.  Lung disease (such as pneumonia, bronchitis, and emphysema).  Heart attack and chest pain due to the heart not getting enough oxygen (angina).  Heart disease and peripheral blood vessel disease.  Hypertension.  Stroke.  Oral cancer (cancer of the lip, mouth, or voice box).  Bladder cancer.  Pancreatic cancer.  Cervical cancer.  Pregnancy complications, including premature birth.  Low birthweight babies.  Early menopause.  Lower estrogen level for women.  Infertility.  Facial wrinkles.  Blindness.  Increased risk of broken bones (fractures).  Senile dementia.  Stillbirths and smaller newborn babies, birth defects, and genetic damage to sperm.  Stomach ulcers and internal bleeding.  Children of smokers have an increased risk of the following, because of secondhand smoke exposure:  Sudden infant death syndrome  (SIDS).  Respiratory infections.  Lung cancer.  Heart disease.  Ear infections.  Smoking causes approximately: 90% of all lung cancer deaths in men.  80% of all lung cancer deaths in women.  90% of deaths from chronic obstructive lung disease.  Compared with nonsmokers, smoking increases the risk of: Coronary heart disease by 2 to 4 times.  Stroke by 2 to 4 times.  Men developing lung cancer by 23 times.  Women developing lung cancer by 13 times.  Dying from chronic obstructive lung diseases by 12 times.  Someone who smokes 2 packs a day loses about 8 years of his or her life. Even smoking lightly shortens your life expectancy by several years. You can greatly reduce the risk of medical problems for you and your family by stopping now. Smoking is the most preventable cause of death and disease in our society. Within days of quitting smoking, your circulation returns to normal, you decrease the risk of having a heart attack, and your lung capacity improves. There may be some increased phlegm in the first few days after quitting, and it may take months for your lungs to clear up completely. Quitting for 10 years cuts your lung cancer risk to almost that of a nonsmoker. WHY IS SMOKING ADDICTIVE? Nicotine is the chemical agent in tobacco that is capable of causing addiction or dependence.  When you smoke and inhale, nicotine is absorbed rapidly into the bloodstream through your lungs. Nicotine absorbed through the lungs is capable of creating a powerful addiction. Both inhaled and non-inhaled nicotine may be addictive.  Addiction studies of cigarettes and spit tobacco show that addiction to nicotine occurs mainly during the teen years, when young people begin using tobacco products.  WHAT ARE THE BENEFITS OF QUITTING?  There are many health benefits to quitting smoking.  Likelihood of developing cancer and heart disease decreases. Health improvements are seen almost immediately.  Blood pressure,  pulse rate, and breathing patterns start returning to normal soon after quitting.  People who quit may see an improvement in their overall quality of life.  Some people choose to quit all at once. Other options include nicotine replacement products, such as patches, gum, and nasal sprays. Do not use these products without first checking with your caregiver. QUITTING SMOKING It is not easy to quit smoking. Nicotine is addicting, and longtime habits are hard to change. To start, you can write down all your reasons for quitting, tell your family and friends you want to quit, and ask for their help. Throw your cigarettes away, chew gum or cinnamon sticks, keep your hands busy, and drink extra water or juice. Go for walks and practice deep breathing to relax. Think of all the money you are saving: around $1,000 a year, for the average pack-a-day smoker. Nicotine patches and gum have been shown to improve success at efforts to stop smoking. Zyban (bupropion) is an anti-depressant drug that can be prescribed to reduce nicotine withdrawal symptoms and to suppress the urge to smoke. Smoking is an addiction with both physical and psychological effects. Joining a stop-smoking support group can help you cope with the emotional issues. For more information and advice on programs to stop smoking, call your doctor, your local hospital, or these organizations: American Lung Association - 1-800-LUNGUSA   Smoking Cessation  This document explains the best ways for you to quit smoking and new treatments to help. It lists new medicines that can double or triple your chances of quitting and quitting for good. It also considers ways to avoid relapses and concerns you may have about quitting, including weight gain. NICOTINE: A POWERFUL ADDICTION If you have tried to quit smoking, you know how hard it can be. It is hard because nicotine is a very addictive drug. For some people, it can be as addictive as heroin or cocaine.  Usually, people make 2 or 3 tries, or more, before finally being able to quit. Each time you try to quit, you can learn about what helps and what hurts. Quitting takes hard work and a lot of effort, but you can quit smoking. QUITTING SMOKING IS ONE OF THE MOST IMPORTANT THINGS YOU WILL EVER DO.  You will live longer, feel better, and live better.   The impact on your body of quitting smoking is felt almost immediately:   Within 20 minutes, blood pressure decreases. Pulse returns to its normal level.   After 8 hours, carbon monoxide levels in the blood return to normal. Oxygen level increases.   After 24 hours, chance of heart attack starts to decrease. Breath, hair, and body stop smelling like smoke.   After 48 hours, damaged nerve endings begin to recover. Sense of taste and smell improve.   After 72 hours, the body is virtually free of nicotine. Bronchial tubes relax and breathing becomes easier.   After 2 to 12 weeks, lungs can hold more air. Exercise becomes easier and circulation improves.   Quitting will reduce your risk of having a heart attack, stroke, cancer, or lung disease:   After 1 year, the risk of coronary heart disease is cut in half.   After 5 years, the risk of stroke falls to the same as a nonsmoker.   After 10 years, the risk of lung cancer is  cut in half and the risk of other cancers decreases significantly.   After 15 years, the risk of coronary heart disease drops, usually to the level of a nonsmoker.   If you are pregnant, quitting smoking will improve your chances of having a healthy baby.   The people you live with, especially your children, will be healthier.   You will have extra money to spend on things other than cigarettes.  FIVE KEYS TO QUITTING Studies have shown that these 5 steps will help you quit smoking and quit for good. You have the best chances of quitting if you use them together: 1. Get ready.  2. Get support and encouragement.   3. Learn new skills and behaviors.  4. Get medicine to reduce your nicotine addiction and use it correctly.  5. Be prepared for relapse or difficult situations. Be determined to continue trying to quit, even if you do not succeed at first.  1. GET READY  Set a quit date.   Change your environment.   Get rid of ALL cigarettes, ashtrays, matches, and lighters in your home, car, and place of work.   Do not let people smoke in your home.   Review your past attempts to quit. Think about what worked and what did not.   Once you quit, do not smoke. NOT EVEN A PUFF!  2. GET SUPPORT AND ENCOURAGEMENT Studies have shown that you have a better chance of being successful if you have help. You can get support in many ways.  Tell your family, friends, and coworkers that you are going to quit and need their support. Ask them not to smoke around you.   Talk to your caregivers (doctor, dentist, nurse, pharmacist, psychologist, and/or smoking counselor).   Get individual, group, or telephone counseling and support. The more counseling you have, the better your chances are of quitting. Programs are available at [employer] and health centers. Call your local health department for information about programs in your area.   Spiritual beliefs and practices may help some smokers quit.   Quit meters are Photographer that keep track of quit statistics, such as amount of "quit-time," cigarettes not smoked, and money saved.   Many smokers find one or more of the many self-help books available useful in helping them quit and stay off tobacco.  3. LEARN NEW SKILLS AND BEHAVIORS  Try to distract yourself from urges to smoke. Talk to someone, go for a walk, or occupy your time with a task.   When you first try to quit, change your routine. Take a different route to work. Drink tea instead of coffee. Eat breakfast in a different place.   Do something to reduce your stress.  Take a hot bath, exercise, or read a book.   Plan something enjoyable to do every day. Reward yourself for not smoking.   Explore interactive web-based programs that specialize in helping you quit.  4. GET MEDICINE AND USE IT CORRECTLY Medicines can help you stop smoking and decrease the urge to smoke. Combining medicine with the above behavioral methods and support can quadruple your chances of successfully quitting smoking. The [place] Food and Drug Administration (FDA) has approved 7 medicines to help you quit smoking. These medicines fall into 3 categories.  Nicotine replacement therapy (delivers nicotine to your body without the negative effects and risks of smoking):   Nicotine gum: Available over-the-counter.   Nicotine lozenges: Available over-the-counter.   Nicotine inhaler: Available by  prescription.   Nicotine nasal spray: Available by prescription.   Nicotine skin patches (transdermal): Available by prescription and over-the-counter.   Antidepressant medicine (helps people abstain from smoking, but how this works is unknown):   Bupropion sustained-release (SR) tablets: Available by prescription.   Nicotinic receptor partial agonist (simulates the effect of nicotine in your brain):   Varenicline tartrate tablets: Available by prescription.   Ask your caregiver for advice about which medicines to use and how to use them. Carefully read the information on the package.   Everyone who is trying to quit may benefit from using a medicine. If you are pregnant or trying to become pregnant, nursing an infant, you are under age 60, or you smoke fewer than 10 cigarettes per day, talk to your caregiver before taking any nicotine replacement medicines.   You should stop using a nicotine replacement product and call your caregiver if you experience nausea, dizziness, weakness, vomiting, fast or irregular heartbeat, mouth problems with the lozenge or gum, or redness or swelling of the skin  around the patch that does not go away.   Do not use any other product containing nicotine while using a nicotine replacement product.   Talk to your caregiver before using these products if you have diabetes, heart disease, asthma, stomach ulcers, you had a recent heart attack, you have high blood pressure that is not controlled with medicine, a history of irregular heartbeat, or you have been prescribed medicine to help you quit smoking.  5. BE PREPARED FOR RELAPSE OR DIFFICULT SITUATIONS  Most relapses occur within the first 3 months after quitting. Do not be discouraged if you start smoking again. Remember, most people try several times before they finally quit.   You may have symptoms of withdrawal because your body is used to nicotine. You may crave cigarettes, be irritable, feel very hungry, cough often, get headaches, or have difficulty concentrating.   The withdrawal symptoms are only temporary. They are strongest when you first quit, but they will go away within 10 to 14 days.  Here are some difficult situations to watch for:  Alcohol. Avoid drinking alcohol. Drinking lowers your chances of successfully quitting.   Caffeine. Try to reduce the amount of caffeine you consume. It also lowers your chances of successfully quitting.   Other smokers. Being around smoking can make you want to smoke. Avoid smokers.   Weight gain. Many smokers will gain weight when they quit, usually less than 10 pounds. Eat a healthy diet and stay active. Do not let weight gain distract you from your main goal, quitting smoking. Some medicines that help you quit smoking may also help delay weight gain. You can always lose the weight gained after you quit.   Bad mood or depression. There are a lot of ways to improve your mood other than smoking.  If you are having problems with any of these situations, talk to your caregiver. SPECIAL SITUATIONS AND CONDITIONS Studies suggest that everyone can quit smoking.  Your situation or condition can give you a special reason to quit.  Pregnant women/new mothers: By quitting, you protect your baby's health and your own.   Hospitalized patients: By quitting, you reduce health problems and help healing.   Heart attack patients: By quitting, you reduce your risk of a second heart attack.   Lung, head, and neck cancer patients: By quitting, you reduce your chance of a second cancer.   Parents of children and adolescents: By quitting, you protect your children  from illnesses caused by secondhand smoke.  QUESTIONS TO THINK ABOUT Think about the following questions before you try to stop smoking. You may want to talk about your answers with your caregiver.  Why do you want to quit?   If you tried to quit in the past, what helped and what did not?   What will be the most difficult situations for you after you quit? How will you plan to handle them?   Who can help you through the tough times? Your family? Friends? Caregiver?   What pleasures do you get from smoking? What ways can you still get pleasure if you quit?  Here are some questions to ask your caregiver:  How can you help me to be successful at quitting?   What medicine do you think would be best for me and how should I take it?   What should I do if I need more help?   What is smoking withdrawal like? How can I get information on withdrawal?  Quitting takes hard work and a lot of effort, but you can quit smoking.

## 2013-06-18 NOTE — Progress Notes (Signed)
This is well below one year if Brandi Cooke 22-Mar-1966 [id number]        47 y.o.  G1P1001 for followup exam.  Several issues noted below.  Past medical history,surgical history, problem list, medications, allergies, family history and social history were all reviewed and documented in the EPIC chart.  ROS:  Performed and pertinent positives and negatives are included in the history, assessment and plan .  Exam: Kim assistant Filed Vitals:   06/18/13 1102  BP: 120/78  Height: 5\' 5"  (1.651 m)  Weight: 153 lb (69.4 kg)   General appearance  Normal Skin grossly normal Head/Neck normal with no cervical or supraclavicular adenopathy thyroid normal. Throat is clear without evidence of pharyngitis. Lungs  clear Cardiac RR, without RMG Abdominal  soft, nontender, without masses, organomegaly or hernia Breasts  examined lying and sitting without masses, retractions, discharge or axillary adenopathy. Pelvic  Ext/BUS/vagina  normal  Cervix  normal  Uterus  retroverted, globoid , approximately 8 weeks size, nontender  Adnexa  Without masses or tenderness    Anus and perineum  normal   Rectovaginal  normal sphincter tone without palpated masses or tenderness.    Assessment/Plan:  47 y.o. G65P1001 female for followup exam, regular menses, not sexually active.   1. Leiomyoma. Menses continue heavy but overall acceptable. They are regular without intermenstrual bleeding. Had recommended ultrasound last year but she never followed up for this. Her exam is stable from last year at least at this point do not feel followup ultrasound necessary. Options for management include observation, hormonal regulation, Mirena IUD and surgery discussed. Patient prefers observation at this time. She is having some dysmenorrhea and takes OTC Motrin. I prescribed ibuprofen 800 mg #32 refills to have available. Check baseline CBC rule out anemia. 2. Sore throat. Patient notes last week her throat was sore but  seems to be getting better. Her exam is normal. Suspect due to the change in weather. Humidifier at home recommended. Followup if symptoms persist or recur. 3. Mammography due now and I recommended her to schedule this and she agrees to do so. SBE monthly reviewed. 4. Pap smear/HPV negative 2013. No Pap smear done today. No history of significant abnormal Pap smears. Plan repeat at 3-5 year interval. 5. Contraception. We've had this discussion previously. She has not sexually active at this time. Had HSG before her myomectomy which showed tubal blockage. Never followed up for repeat HSG as discussed previously. Understands the possibility of pregnancy if she becomes sexually active but declines any contraception at this point. 6. Stop smoking again reviewed with strategies discussed. 7. Health maintenance. No routine blood work done. She understands the need to establish care with a primary physician who will then do her routine blood work. Patient agrees to arrange this. Followup one year, sooner as needed.   Note: This document was prepared with digital dictation and possible smart phrase technology. Any transcriptional errors that result from this process are unintentional.   [name], 11:47 AM 06/18/2013

## 2013-09-10 ENCOUNTER — Encounter (HOSPITAL_COMMUNITY): Payer: Self-pay | Admitting: Emergency Medicine

## 2013-09-10 ENCOUNTER — Emergency Department (HOSPITAL_COMMUNITY)
Admission: EM | Admit: 2013-09-10 | Discharge: 2013-09-10 | Disposition: A | Discharge status: Home / Self Care | Payer: No Typology Code available for payment source | Attending: Emergency Medicine | Admitting: Emergency Medicine

## 2013-09-10 DIAGNOSIS — S199XXA Unspecified injury of neck, initial encounter: Secondary | ICD-10-CM

## 2013-09-10 DIAGNOSIS — Y92410 Unspecified street and highway as the place of occurrence of the external cause: Secondary | ICD-10-CM | POA: Diagnosis not present

## 2013-09-10 DIAGNOSIS — F17200 Nicotine dependence, unspecified, uncomplicated: Secondary | ICD-10-CM | POA: Insufficient documentation

## 2013-09-10 DIAGNOSIS — Z8669 Personal history of other diseases of the nervous system and sense organs: Secondary | ICD-10-CM | POA: Diagnosis not present

## 2013-09-10 DIAGNOSIS — IMO0002 Reserved for concepts with insufficient information to code with codable children: Secondary | ICD-10-CM | POA: Insufficient documentation

## 2013-09-10 DIAGNOSIS — Y9389 Activity, other specified: Secondary | ICD-10-CM | POA: Diagnosis not present

## 2013-09-10 DIAGNOSIS — R42 Dizziness and giddiness: Secondary | ICD-10-CM | POA: Insufficient documentation

## 2013-09-10 DIAGNOSIS — Z8742 Personal history of other diseases of the female genital tract: Secondary | ICD-10-CM | POA: Insufficient documentation

## 2013-09-10 DIAGNOSIS — S0993XA Unspecified injury of face, initial encounter: Secondary | ICD-10-CM | POA: Diagnosis not present

## 2013-09-10 DIAGNOSIS — M549 Dorsalgia, unspecified: Secondary | ICD-10-CM

## 2013-09-10 MED ORDER — METHOCARBAMOL 500 MG PO TABS: 500.0000 mg | ORAL_TABLET | Freq: Two times a day (BID) | ORAL | Status: DC | PRN

## 2013-09-10 MED ORDER — CYCLOBENZAPRINE HCL 10 MG PO TABS
10.0000 mg | ORAL_TABLET | Freq: Once | ORAL | Status: DC
Start: 2013-09-10 — End: 2013-09-10
  Filled 2013-09-10: qty 1

## 2013-09-10 MED ORDER — ACETAMINOPHEN 500 MG PO TABS: 500.0000 mg | ORAL_TABLET | Freq: Four times a day (QID) | ORAL | Status: DC | PRN

## 2013-09-10 MED ORDER — ACETAMINOPHEN 325 MG PO TABS
650.0000 mg | ORAL_TABLET | Freq: Once | ORAL | Status: DC
  Filled 2013-09-10: qty 2

## 2013-09-10 NOTE — ED Provider Notes (Signed)
Medical screening examination/treatment/procedure(s) were performed by non-physician practitioner and as supervising physician I was immediately available for consultation/collaboration.  EKG Interpretation   None         Ephraim Hamburger, MD 09/10/13 2357
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
## 2013-09-10 NOTE — Discharge Instructions (Signed)
Take tylenol as needed for pain. Take Robaxin as needed for muscle spasm. Refer to attached documents for more information. Return to the ED with worsening or concerning symptoms.

## 2013-10-07 ENCOUNTER — Other Ambulatory Visit: Payer: Self-pay

## 2013-10-07 DIAGNOSIS — Z1231 Encounter for screening mammogram for malignant neoplasm of breast: Secondary | ICD-10-CM

## 2013-10-21 ENCOUNTER — Ambulatory Visit
Admission: RE | Admit: 2013-10-21 | Discharge: 2013-10-21 | Disposition: A | Discharge status: Home / Self Care | Payer: 59 | Source: Ambulatory Visit

## 2013-10-21 DIAGNOSIS — Z1231 Encounter for screening mammogram for malignant neoplasm of breast: Secondary | ICD-10-CM

## 2013-10-22 ENCOUNTER — Other Ambulatory Visit: Payer: Self-pay | Admitting: Gynecology

## 2013-10-22 DIAGNOSIS — R928 Other abnormal and inconclusive findings on diagnostic imaging of breast: Secondary | ICD-10-CM

## 2013-11-06 ENCOUNTER — Other Ambulatory Visit: Payer: Self-pay | Admitting: Gynecology

## 2013-11-06 ENCOUNTER — Ambulatory Visit
Admission: RE | Admit: 2013-11-06 | Discharge: 2013-11-06 | Disposition: A | Discharge status: Home / Self Care | Payer: Self-pay | Source: Ambulatory Visit | Attending: Gynecology | Admitting: Gynecology

## 2013-11-06 ENCOUNTER — Ambulatory Visit
Admission: RE | Admit: 2013-11-06 | Discharge: 2013-11-06 | Disposition: A | Discharge status: Home / Self Care | Payer: 59 | Source: Ambulatory Visit | Attending: Gynecology | Admitting: Gynecology

## 2013-11-06 DIAGNOSIS — N6320 Unspecified lump in the left breast, unspecified quadrant: Secondary | ICD-10-CM

## 2013-11-06 DIAGNOSIS — R928 Other abnormal and inconclusive findings on diagnostic imaging of breast: Secondary | ICD-10-CM

## 2013-11-20 ENCOUNTER — Ambulatory Visit
Admission: RE | Admit: 2013-11-20 | Discharge: 2013-11-20 | Disposition: A | Discharge status: Home / Self Care | Payer: 59 | Source: Ambulatory Visit | Attending: Gynecology | Admitting: Gynecology

## 2013-11-20 ENCOUNTER — Encounter (INDEPENDENT_AMBULATORY_CARE_PROVIDER_SITE_OTHER): Payer: Self-pay

## 2013-11-20 DIAGNOSIS — N6320 Unspecified lump in the left breast, unspecified quadrant: Secondary | ICD-10-CM

## 2013-11-27 ENCOUNTER — Telehealth: Payer: Self-pay

## 2013-11-27 NOTE — Telephone Encounter (Signed)
[facility] is seeing this patient for  Left gradual cell tumor i(breast)  Dr. Brantley Stage  NPI  [id number]  Patient she has Brandi Cooke.    [phone]  CCS

## 2013-12-07 ENCOUNTER — Ambulatory Visit (INDEPENDENT_AMBULATORY_CARE_PROVIDER_SITE_OTHER): Payer: 59 | Admitting: Surgery

## 2013-12-07 ENCOUNTER — Encounter (INDEPENDENT_AMBULATORY_CARE_PROVIDER_SITE_OTHER): Payer: Self-pay | Admitting: Surgery

## 2013-12-07 VITALS — BP 120/72 | HR 74 | Temp 97.8°F | Ht 67.0 in | Wt 151.0 lb

## 2013-12-07 DIAGNOSIS — D219 Benign neoplasm of connective and other soft tissue, unspecified: Secondary | ICD-10-CM

## 2013-12-07 NOTE — Progress Notes (Signed)
Patient ID: Brandi Cooke, female   DOB: July 21, 1966, 48 y.o.   MRN: [id number]  Chief Complaint  Patient presents with  . 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 female.  Patient sent requested Dr. 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 He denies any history of breast mass, nipple discharge or other abnormality. Denies any breast pain. No known history of breast cancer. HPI  Past Medical History  Diagnosis Date  . Glaucoma     Open  . Fibroid   . Bell's palsy     Past Surgical History  Procedure Laterality Date  . Myomectomy  10/2005  . Pelvic laparoscopy      Expl. Lap.  . Refractive surgery    . Hysteroscopy  2002    myomectomy    Family History  Problem Relation Age of Onset  . Hypertension Mother   . Diabetes Mother   . Stroke Brother   . Migraines Brother   . Cancer Maternal Uncle     Unknown type  . Migraines Brother   . Heart disease Brother   . Thyroid disease Brother     Social History History  Substance Use Topics  . Smoking status: Current Every Day Smoker -- 1.00 packs/day  . Smokeless tobacco: Not on file  . Alcohol Use: 3.5 oz/week    7 drink(s) per week    No Known Allergies  Current Outpatient Prescriptions  Medication Sig Dispense Refill  . acetaminophen (TYLENOL) 500 MG tablet Take 1 tablet (500 mg total) by mouth every 6 (six) hours as needed.  30 tablet  0   No current facility-administered medications for this visit.    Review of Systems Review of Systems  Constitutional: Negative for fever, chills and unexpected weight change.  HENT: Negative for congestion, hearing loss, sore throat, trouble swallowing and voice change.   Eyes: Negative for visual disturbance.  Respiratory: Negative for cough and wheezing.   Cardiovascular: Negative for chest pain, palpitations and leg swelling.  Gastrointestinal:  Negative for nausea, vomiting, abdominal pain, diarrhea, constipation, blood in stool, abdominal distention and anal bleeding.  Genitourinary: Negative for hematuria, vaginal bleeding and difficulty urinating.  Musculoskeletal: Negative for arthralgias.  Skin: Negative for rash and wound.  Neurological: Negative for seizures, syncope and headaches.  Hematological: Negative for adenopathy. Does not bruise/bleed easily.  Psychiatric/Behavioral: Negative for confusion.    Blood pressure 120/72, pulse 74, temperature 97.8 F (36.6 C), height 5\' 7"  (1.702 m), weight 151 lb (68.493 kg).  Physical Exam Physical Exam  Constitutional: She is oriented to person, place, and time. She appears well-developed and well-nourished.  Poor dentition  HENT:  Head: Normocephalic.  Mouth/Throat: No oropharyngeal exudate.  Eyes: Pupils are equal, round, and reactive to light. No scleral icterus.  Neck: Normal range of motion. Neck supple.  Cardiovascular: Normal rate and regular rhythm.   Pulmonary/Chest: Effort normal and breath sounds normal. Right breast exhibits no inverted nipple, no mass, no nipple discharge, no skin change and no tenderness. Left breast exhibits no inverted nipple, no mass, no nipple discharge, no skin change and no tenderness. Breasts are symmetrical.  Musculoskeletal: Normal range of motion.  Lymphadenopathy:    She has no cervical adenopathy.  Neurological: She is alert and oriented to person, place, and time.  Skin: Skin is warm and dry.  Psychiatric: She has  a normal mood and affect. Her behavior is normal. Judgment and thought content normal.    Data Reviewed Breast, left, needle core biopsy, UIQ - GRANULAR CELL TUMOR. - SEE COMMENT.  Assessment    Left breast granular tumor    Plan    This is usually a benign tumor.  Discussed with patient.  Options of excision vs observation discussed.  Low but possible risk of malignancy in this setting.  She would like to have it  excised. The procedure has been discussed with the patient. Alternatives to surgery have been discussed with the patient.  Risks of surgery include bleeding,  Infection,  Seroma formation, death,  and the need for further surgery.   The patient understands and wishes to proceed.         Brandi Cooke A. Taria Castrillo 12/07/2013, 2:49 PM

## 2013-12-07 NOTE — Patient Instructions (Signed)
Lumpectomy A lumpectomy is a form of "breast conserving" or "breast preservation" surgery. It may also be referred to as a partial mastectomy. During a lumpectomy, the portion of the breast that contains the cancerous tumor or breast mass (the lump) is removed. Some normal tissue around the lump may also be removed to make sure all the tumor has been removed. This surgery should take 40 minutes or less. LET YOUR HEALTH CARE PROVIDER KNOW ABOUT:  Any allergies you have.  All medicines you are taking, including vitamins, herbs, eye drops, creams, and over-the-counter medicines.  Previous problems you or members of your family have had with the use of anesthetics.  Any blood disorders you have.  Previous surgeries you have had.  Medical conditions you have. RISKS AND COMPLICATIONS Generally, this is a safe procedure. However, as with any procedure, complications can occur. Possible complications include:  Bleeding.  Infection.  Pain.  Temporary swelling.  Change in the shape of the breast, particularly if a large portion is removed. BEFORE THE PROCEDURE  Ask your health care provider about changing or stopping your regular medicines.  Do not eat or drink anything for 7 8 hours before the surgery or as directed by your health care provider. Ask your health care provider if you can take a sip of water with any approved medicines.  On the day of surgery, your healthcare provider will use a mammogram or ultrasound to locate and mark the tumor in your breast. These markings on your breast will show where the cut (incision) will be made. PROCEDURE   An IV tube will be put into one of your veins.  You may be given medicine to help you relax before the surgery (sedative). You will be given one of the following:  A medicine that numbs the area (local anesthesia).  A medicine that makes you go to sleep (general anesthesia).  Your health care provider will use a kind of electric scalpel  that uses heat to minimize bleeding (electrocautery knife).  A curved incision (like a smile or frown) that follows the natural curve of your breast is made, to allow for minimal scarring and better healing.  The tumor will be removed with some of the surrounding tissue. This will be sent to the lab for analysis. Your health care provider may also remove your lymph nodes at this time if needed.  Sometimes, but not always, a rubber tube called a drain will be surgically inserted into your breast area or armpit to collect excess fluid that may accumulate in the space where the tumor was. This drain is connected to a plastic bulb on the outside of your body. This drain creates suction to help remove the fluid.  The incisions will be closed with stitches (sutures).  A bandage may be placed over the incisions. AFTER THE PROCEDURE  You will be taken to the recovery area.  You will be given medicine for pain.  A small rubber drain may be placed in the breast for 2 3 days to prevent a collection of blood (hematoma) from developing in the breast. You will be given instructions on caring for the drain before you go home.  A pressure bandage (dressing) will be applied for 1 2 days to prevent bleeding. Ask your health care provider how to care for your bandage at home. Document Released: 08/27/2006 Document Revised: 03/18/2013 Document Reviewed: 12/19/2012 Exit[employer] Patient Information 2014 [employer].  

## 2013-12-08 ENCOUNTER — Other Ambulatory Visit (INDEPENDENT_AMBULATORY_CARE_PROVIDER_SITE_OTHER): Payer: Self-pay | Admitting: Surgery

## 2013-12-08 DIAGNOSIS — D219 Benign neoplasm of connective and other soft tissue, unspecified: Secondary | ICD-10-CM

## 2013-12-31 ENCOUNTER — Other Ambulatory Visit: Payer: Self-pay

## 2014-01-06 ENCOUNTER — Encounter (HOSPITAL_BASED_OUTPATIENT_CLINIC_OR_DEPARTMENT_OTHER): Payer: Self-pay | Admitting: *Deleted

## 2014-01-06 NOTE — Progress Notes (Signed)
No labs needed

## 2014-01-08 ENCOUNTER — Encounter (INDEPENDENT_AMBULATORY_CARE_PROVIDER_SITE_OTHER): Payer: Self-pay

## 2014-01-08 ENCOUNTER — Ambulatory Visit
Admission: RE | Admit: 2014-01-08 | Discharge: 2014-01-08 | Disposition: A | Discharge status: Home / Self Care | Payer: 59 | Source: Ambulatory Visit | Attending: Surgery | Admitting: Surgery

## 2014-01-08 DIAGNOSIS — D219 Benign neoplasm of connective and other soft tissue, unspecified: Secondary | ICD-10-CM

## 2014-01-12 ENCOUNTER — Encounter (HOSPITAL_BASED_OUTPATIENT_CLINIC_OR_DEPARTMENT_OTHER): Payer: Self-pay | Admitting: *Deleted

## 2014-01-12 ENCOUNTER — Encounter (HOSPITAL_BASED_OUTPATIENT_CLINIC_OR_DEPARTMENT_OTHER)
Admission: RE | Disposition: A | Discharge status: Home / Self Care | Payer: Self-pay | Source: Ambulatory Visit | Attending: Surgery

## 2014-01-12 ENCOUNTER — Encounter (HOSPITAL_BASED_OUTPATIENT_CLINIC_OR_DEPARTMENT_OTHER): Payer: 59 | Admitting: Anesthesiology

## 2014-01-12 ENCOUNTER — Ambulatory Visit (HOSPITAL_BASED_OUTPATIENT_CLINIC_OR_DEPARTMENT_OTHER)
Admission: RE | Admit: 2014-01-12 | Discharge: 2014-01-12 | Disposition: A | Discharge status: Home / Self Care | Payer: 59 | Source: Ambulatory Visit | Attending: Surgery | Admitting: Surgery

## 2014-01-12 ENCOUNTER — Ambulatory Visit (HOSPITAL_BASED_OUTPATIENT_CLINIC_OR_DEPARTMENT_OTHER): Payer: 59 | Admitting: Anesthesiology

## 2014-01-12 ENCOUNTER — Ambulatory Visit
Admission: RE | Admit: 2014-01-12 | Discharge: 2014-01-12 | Disposition: A | Discharge status: Home / Self Care | Payer: 59 | Source: Ambulatory Visit | Attending: Surgery | Admitting: Surgery

## 2014-01-12 DIAGNOSIS — R920 Mammographic microcalcification found on diagnostic imaging of breast: Secondary | ICD-10-CM

## 2014-01-12 DIAGNOSIS — D219 Benign neoplasm of connective and other soft tissue, unspecified: Secondary | ICD-10-CM

## 2014-01-12 DIAGNOSIS — N6029 Fibroadenosis of unspecified breast: Secondary | ICD-10-CM

## 2014-01-12 DIAGNOSIS — F17200 Nicotine dependence, unspecified, uncomplicated: Secondary | ICD-10-CM | POA: Insufficient documentation

## 2014-01-12 DIAGNOSIS — G510 Bell's palsy: Secondary | ICD-10-CM | POA: Insufficient documentation

## 2014-01-12 DIAGNOSIS — D249 Benign neoplasm of unspecified breast: Secondary | ICD-10-CM | POA: Insufficient documentation

## 2014-01-12 DIAGNOSIS — D259 Leiomyoma of uterus, unspecified: Secondary | ICD-10-CM | POA: Insufficient documentation

## 2014-01-12 DIAGNOSIS — H409 Unspecified glaucoma: Secondary | ICD-10-CM | POA: Insufficient documentation

## 2014-01-12 HISTORY — PX: BREAST LUMPECTOMY WITH RADIOACTIVE SEED LOCALIZATION: SHX6424

## 2014-01-12 SURGERY — BREAST LUMPECTOMY WITH RADIOACTIVE SEED LOCALIZATION
Anesthesia: General | Site: Breast | Laterality: Left

## 2014-01-12 MED ORDER — ONDANSETRON HCL 4 MG/2ML IJ SOLN
INTRAMUSCULAR | Status: DC | PRN
Start: 2014-01-12 — End: 2014-01-12
  Administered 2014-01-12: 4 mg via INTRAVENOUS

## 2014-01-12 MED ORDER — LIDOCAINE HCL (CARDIAC) 20 MG/ML IV SOLN
INTRAVENOUS | Status: DC | PRN
  Administered 2014-01-12: 50 mg via INTRAVENOUS

## 2014-01-12 MED ORDER — FENTANYL CITRATE 0.05 MG/ML IJ SOLN
INTRAMUSCULAR | Status: DC | PRN
  Administered 2014-01-12: 100 ug via INTRAVENOUS

## 2014-01-12 MED ORDER — HYDROMORPHONE HCL PF 1 MG/ML IJ SOLN
INTRAMUSCULAR | Status: AC
  Filled 2014-01-12: qty 1

## 2014-01-12 MED ORDER — OXYCODONE HCL 5 MG PO TABS
5.0000 mg | ORAL_TABLET | Freq: Once | ORAL | Status: AC | PRN
  Administered 2014-01-12: 5 mg via ORAL

## 2014-01-12 MED ORDER — MEPERIDINE HCL 25 MG/ML IJ SOLN: 6.2500 mg | INTRAMUSCULAR | Status: DC | PRN

## 2014-01-12 MED ORDER — DEXAMETHASONE SODIUM PHOSPHATE 4 MG/ML IJ SOLN
INTRAMUSCULAR | Status: DC | PRN
  Administered 2014-01-12: 10 mg via INTRAVENOUS

## 2014-01-12 MED ORDER — BUPIVACAINE-EPINEPHRINE (PF) 0.25% -1:200000 IJ SOLN
INTRAMUSCULAR | Status: AC
  Filled 2014-01-12: qty 30

## 2014-01-12 MED ORDER — FENTANYL CITRATE 0.05 MG/ML IJ SOLN
INTRAMUSCULAR | Status: AC
  Filled 2014-01-12: qty 4

## 2014-01-12 MED ORDER — CEFAZOLIN SODIUM-DEXTROSE 2-3 GM-% IV SOLR
2.0000 g | INTRAVENOUS | Status: AC
  Administered 2014-01-12: 2 g via INTRAVENOUS

## 2014-01-12 MED ORDER — PROPOFOL 10 MG/ML IV BOLUS
INTRAVENOUS | Status: DC | PRN
  Administered 2014-01-12: 150 mg via INTRAVENOUS

## 2014-01-12 MED ORDER — HYDROMORPHONE HCL PF 1 MG/ML IJ SOLN
0.2500 mg | INTRAMUSCULAR | Status: DC | PRN
  Administered 2014-01-12 (×2): 0.5 mg via INTRAVENOUS

## 2014-01-12 MED ORDER — FENTANYL CITRATE 0.05 MG/ML IJ SOLN: 50.0000 ug | INTRAMUSCULAR | Status: DC | PRN

## 2014-01-12 MED ORDER — CHLORHEXIDINE GLUCONATE 4 % EX LIQD: 1.0000 "application " | Freq: Once | CUTANEOUS | Status: DC

## 2014-01-12 MED ORDER — OXYCODONE-ACETAMINOPHEN 5-325 MG PO TABS: 1.0000 | ORAL_TABLET | ORAL | Status: DC | PRN

## 2014-01-12 MED ORDER — LACTATED RINGERS IV SOLN
INTRAVENOUS | Status: DC
  Administered 2014-01-12: 12:00:00 via INTRAVENOUS
  Administered 2014-01-12: 10 mL/h via INTRAVENOUS

## 2014-01-12 MED ORDER — CEFAZOLIN SODIUM-DEXTROSE 2-3 GM-% IV SOLR
INTRAVENOUS | Status: AC
Start: 2014-01-12 — End: 2014-01-12
  Filled 2014-01-12: qty 50

## 2014-01-12 MED ORDER — MIDAZOLAM HCL 2 MG/2ML IJ SOLN: 1.0000 mg | INTRAMUSCULAR | Status: DC | PRN

## 2014-01-12 MED ORDER — MIDAZOLAM HCL 5 MG/5ML IJ SOLN
INTRAMUSCULAR | Status: DC | PRN
  Administered 2014-01-12: 2 mg via INTRAVENOUS

## 2014-01-12 MED ORDER — OXYCODONE HCL 5 MG PO TABS
ORAL_TABLET | ORAL | Status: AC
  Filled 2014-01-12: qty 1

## 2014-01-12 MED ORDER — ONDANSETRON HCL 4 MG/2ML IJ SOLN: 4.0000 mg | Freq: Once | INTRAMUSCULAR | Status: DC | PRN

## 2014-01-12 MED ORDER — OXYCODONE HCL 5 MG/5ML PO SOLN: 5.0000 mg | Freq: Once | ORAL | Status: AC | PRN

## 2014-01-12 MED ORDER — MIDAZOLAM HCL 2 MG/2ML IJ SOLN
INTRAMUSCULAR | Status: AC
  Filled 2014-01-12: qty 2

## 2014-01-12 SURGICAL SUPPLY — 56 items
ADH SKN CLS APL DERMABOND .7 (GAUZE/BANDAGES/DRESSINGS) ×1
APPLIER CLIP 9.375 MED OPEN (MISCELLANEOUS)
APR CLP MED 9.3 20 MLT OPN (MISCELLANEOUS)
BINDER BREAST LRG (GAUZE/BANDAGES/DRESSINGS) IMPLANT
BINDER BREAST MEDIUM (GAUZE/BANDAGES/DRESSINGS) IMPLANT
BINDER BREAST XLRG (GAUZE/BANDAGES/DRESSINGS) IMPLANT
BINDER BREAST XXLRG (GAUZE/BANDAGES/DRESSINGS) IMPLANT
BLADE SURG 15 STRL LF DISP TIS (BLADE) ×1 IMPLANT
BLADE SURG 15 STRL SS (BLADE) ×3
CANISTER SUC SOCK COL 7IN (MISCELLANEOUS) ×1 IMPLANT
CANISTER SUCT 1200ML W/VALVE (MISCELLANEOUS) IMPLANT
CHLORAPREP W/TINT 26ML (MISCELLANEOUS) ×3 IMPLANT
CLIP APPLIE 9.375 MED OPEN (MISCELLANEOUS) IMPLANT
CLIP TI WIDE RED SMALL 6 (CLIP) ×3 IMPLANT
COVER MAYO STAND STRL (DRAPES) ×3 IMPLANT
COVER PROBE W GEL 5X96 (DRAPES) ×3 IMPLANT
COVER TABLE BACK 60X90 (DRAPES) ×3 IMPLANT
DECANTER SPIKE VIAL GLASS SM (MISCELLANEOUS) IMPLANT
DERMABOND ADVANCED (GAUZE/BANDAGES/DRESSINGS) ×2
DERMABOND ADVANCED .7 DNX12 (GAUZE/BANDAGES/DRESSINGS) ×1 IMPLANT
DEVICE DUBIN W/COMP PLATE 8390 (MISCELLANEOUS) ×3 IMPLANT
DRAPE LAPAROSCOPIC ABDOMINAL (DRAPES) IMPLANT
DRAPE PED LAPAROTOMY (DRAPES) ×3 IMPLANT
DRAPE UTILITY XL STRL (DRAPES) ×3 IMPLANT
ELECT COATED BLADE 2.86 ST (ELECTRODE) ×3 IMPLANT
ELECT REM PT RETURN 9FT ADLT (ELECTROSURGICAL) ×3
ELECTRODE REM PT RTRN 9FT ADLT (ELECTROSURGICAL) ×1 IMPLANT
GLOVE BIOGEL PI IND STRL 7.0 (GLOVE) IMPLANT
GLOVE BIOGEL PI IND STRL 8 (GLOVE) ×1 IMPLANT
GLOVE BIOGEL PI INDICATOR 7.0 (GLOVE) ×4
GLOVE BIOGEL PI INDICATOR 8 (GLOVE) ×2
GLOVE ECLIPSE 8.0 STRL XLNG CF (GLOVE) ×1 IMPLANT
GLOVE SURG SS PI 6.5 STRL IVOR (GLOVE) ×2 IMPLANT
GLOVE SURG SS PI 7.0 STRL IVOR (GLOVE) ×6 IMPLANT
GLOVE SURG SS PI 8.0 STRL IVOR (GLOVE) ×2 IMPLANT
GOWN STRL REUS W/ TWL LRG LVL3 (GOWN DISPOSABLE) ×2 IMPLANT
GOWN STRL REUS W/TWL LRG LVL3 (GOWN DISPOSABLE) ×15
KIT MARKER MARGIN INK (KITS) ×3 IMPLANT
NDL HYPO 25X1 1.5 SAFETY (NEEDLE) ×1 IMPLANT
NEEDLE HYPO 25X1 1.5 SAFETY (NEEDLE) ×3 IMPLANT
NS IRRIG 1000ML POUR BTL (IV SOLUTION) ×3 IMPLANT
PACK BASIN DAY SURGERY FS (CUSTOM PROCEDURE TRAY) ×3 IMPLANT
PENCIL BUTTON HOLSTER BLD 10FT (ELECTRODE) ×3 IMPLANT
SLEEVE SCD COMPRESS KNEE MED (MISCELLANEOUS) ×3 IMPLANT
SPONGE LAP 4X18 X RAY DECT (DISPOSABLE) ×3 IMPLANT
STAPLER VISISTAT 35W (STAPLE) IMPLANT
SUT MNCRL AB 4-0 PS2 18 (SUTURE) ×3 IMPLANT
SUT SILK 2 0 SH (SUTURE) IMPLANT
SUT VIC AB 3-0 SH 27 (SUTURE) ×3
SUT VIC AB 3-0 SH 27X BRD (SUTURE) ×1 IMPLANT
SYR CONTROL 10ML LL (SYRINGE) ×3 IMPLANT
TOWEL OR 17X24 6PK STRL BLUE (TOWEL DISPOSABLE) ×3 IMPLANT
TOWEL OR NON WOVEN STRL DISP B (DISPOSABLE) ×1 IMPLANT
TUBE CONNECTING 20'X1/4 (TUBING)
TUBE CONNECTING 20X1/4 (TUBING) IMPLANT
YANKAUER SUCT BULB TIP NO VENT (SUCTIONS) IMPLANT

## 2014-01-12 NOTE — Anesthesia Postprocedure Evaluation (Signed)
Anesthesia Post Note  Patient: Brandi Cooke  Procedure(s) Performed: Procedure(s) (LRB): LEFT BREAST LUMPECTOMY WITH RADIOACTIVE SEED LOCALIZATION (Left)  Anesthesia type: general  Patient location: PACU  Post pain: Pain level controlled  Post assessment: Patient's Cardiovascular Status Stable  Last Vitals:  Filed Vitals:   01/12/14 1400  BP: 123/83  Pulse: 58  Temp:   Resp: 12    Post vital signs: Reviewed and stable  Level of consciousness: sedated  Complications: No apparent anesthesia complications

## 2014-01-12 NOTE — Transfer of Care (Signed)
Immediate Anesthesia Transfer of Care Note  Patient: Brandi Cooke  Procedure(s) Performed: Procedure(s): LEFT BREAST LUMPECTOMY WITH RADIOACTIVE SEED LOCALIZATION (Left)  Patient Location: PACU  Anesthesia Type:General  Level of Consciousness: awake, alert  and oriented  Airway & Oxygen Therapy: Patient Spontanous Breathing and Patient connected to face mask oxygen  Post-op Assessment: Report given to PACU RN and Post -op Vital signs reviewed and stable  Post vital signs: Reviewed and stable  Complications: No apparent anesthesia complications

## 2014-01-12 NOTE — Anesthesia Procedure Notes (Signed)
Procedure Name: LMA Insertion Date/Time: 01/12/2014 12:30 PM Performed by: Melynda Ripple D Pre-anesthesia Checklist: Patient identified, Emergency Drugs available, Suction available and Patient being monitored Patient Re-evaluated:Patient Re-evaluated prior to inductionOxygen Delivery Method: Circle System Utilized Preoxygenation: Pre-oxygenation with 100% oxygen Intubation Type: IV induction Ventilation: Mask ventilation without difficulty LMA: LMA inserted LMA Size: 4.0 Number of attempts: 1 Airway Equipment and Method: bite block Placement Confirmation: positive ETCO2 Tube secured with: Tape Dental Injury: Teeth and Oropharynx as per pre-operative assessment

## 2014-01-12 NOTE — H&P (Signed)
Transplants    None    Demographics Brandi Cooke 48 year old female  [place] [address] [phone] [facility])  Comm Pref: None [place] [phone] (M) Works at [employer]   Problem ListHospitalization ProblemNon-Hospital  Glaucoma  Fibroid  Bell's palsy  Granular cell tumor  Significant History/Details  Smoking: Current Every Day Smoker, 1 ppd  Smokeless Tobacco: Unknown  Alcohol: 3.5 oz alcohol/week  5 open orders  Preferred Language: English  Dialysis HistoryNone   Currently admitted as of 6/16/2015Specialty CommentsEditShow AllReport05/11/2013 6 visits referral # [id number] uhc from [facility] office.. stpegram  4-28 Leigh to get referral from [facility]...jw 12/07/13:PT DECLINED/SR DOS 01/12/14 TC-[facility]-OP- Lt breast seed locailization([facility] @ 11am 01/08/14 lumpectomy/de 12/07/13 19301 12/09/13 pt scheduled for op surgery 01/12/14 @ [facility] ref #[id number] per on-line. (de,chm)    MedicationsHospital Medications Outpatient Medications  ceFAZolin (ANCEF) IVPB 2 g/50 mL premix  chlorhexidine (HIBICLENS) 4 % liquid 1 application  fentaNYL (SUBLIMAZE) injection 50-100 mcg  lactated ringers infusion  midazolam (VERSED) injection 1-2 mg    Preferred Labs   None   Transplant-Related Biopsies (11 years) ** None **  Patient Blood Type (50 years)   None                                 Recent Visits (Maximum of 10 visits)Date Type Provider Description  01/12/2014 Surgery CORNETT,THOMAS A., MD   12/07/2013 Office Visit Erroll Luna A., MD Granular Cell Tumor (Primary Dx)         My Last Outpatient Progress NoteStatus Last Edited Encounter Date  Signed Mon Dec 07, 2013 2:56 PM EDT 12/07/2013  Patient ID: Brandi Cooke, female   DOB: Dec 11, 1965, 48 y.o.   MRN: [id number]    Chief Complaint   Patient presents with   .  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 female.  Patient sent requested Dr. 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 He denies any history of breast mass, nipple discharge or other abnormality. Denies any breast pain. No known history of breast cancer. HPI    Past Medical History   Diagnosis  Date   .  Glaucoma         Open   .  Fibroid     .  Bell's palsy         Past Surgical History   Procedure  Laterality  Date   .  Myomectomy    10/2005   .  Pelvic laparoscopy           Expl. Lap.   .  Refractive surgery       .  Hysteroscopy    2002       myomectomy       Family History   Problem  Relation  Age of Onset   .  Hypertension  Mother     .  Diabetes  Mother     .  Stroke  Brother     .  Migraines  Brother     .  Cancer  Maternal Uncle         Unknown type   .  Migraines  Brother     .  Heart disease  Brother     .  Thyroid disease  Brother        Social History History   Substance Use Topics   .  Smoking status:  Current Every Day Smoker -- 1.00 packs/day   .  Smokeless tobacco:  Not on file   .  Alcohol Use:  3.5 oz/week       7 drink(s) per week      No Known Allergies    Current Outpatient Prescriptions   Medication  Sig  Dispense  Refill   .  acetaminophen (TYLENOL) 500 MG tablet  Take 1 tablet (500 mg total) by mouth every 6 (six) hours as needed.   30 tablet   0       No current facility-administered medications for this visit.      Review of Systems Review of Systems  Constitutional: Negative for fever, chills and unexpected weight change.  HENT: Negative for congestion, hearing loss, sore throat, trouble swallowing and voice change.   Eyes: Negative for visual disturbance.  Respiratory: Negative for cough and wheezing.   Cardiovascular: Negative for chest pain, palpitations and leg swelling.  Gastrointestinal: Negative for nausea, vomiting, abdominal pain, diarrhea, constipation, blood  in stool, abdominal distention and anal bleeding.  Genitourinary: Negative for hematuria, vaginal bleeding and difficulty urinating.  Musculoskeletal: Negative for arthralgias.  Skin: Negative for rash and wound.  Neurological: Negative for seizures, syncope and headaches.  Hematological: Negative for adenopathy. Does not bruise/bleed easily.  Psychiatric/Behavioral: Negative for confusion.    Blood pressure 120/72, pulse 74, temperature 97.8 F (36.6 C), height 5\' 7"  (1.702 m), weight 151 lb (68.493 kg).   Physical Exam Physical Exam  Constitutional: She is oriented to person, place, and time. She appears well-developed and well-nourished.  Poor dentition  HENT:   Head: Normocephalic.   Mouth/Throat: No oropharyngeal exudate.  Eyes: Pupils are equal, round, and reactive to light. No scleral icterus.  Neck: Normal range of motion. Neck supple.  Cardiovascular: Normal rate and regular rhythm.   Pulmonary/Chest: Effort normal and breath sounds normal. Right breast exhibits no inverted nipple, no mass, no nipple discharge, no skin change and no tenderness. Left breast exhibits no inverted nipple, no mass, no nipple discharge, no skin change and no tenderness. Breasts are symmetrical.  Musculoskeletal: Normal range of motion.  Lymphadenopathy:    She has no cervical adenopathy.  Neurological: She is alert and oriented to person, place, and time.  Skin: Skin is warm and dry.  Psychiatric: She has a normal mood and affect. Her behavior is normal. Judgment and thought content normal.    Data Reviewed Breast, left, needle core biopsy, UIQ - GRANULAR CELL TUMOR. - SEE COMMENT.   Assessment Left breast granular tumor   Plan This is usually a benign tumor.  Discussed with patient.  Options of excision vs observation discussed.  Low but possible risk of malignancy in this setting.  She would like to have it excised. The procedure has been discussed with the patient. Alternatives to  surgery have been discussed with the patient.  Risks of surgery include bleeding,  Infection,  Seroma formation, death,  and the need for further surgery.   The patient understands and wishes to proceed.         Thomas A. Cornett

## 2014-01-12 NOTE — Discharge Instructions (Signed)
[facility] °Office Phone Number [phone] ° °BREAST BIOPSY/ PARTIAL MASTECTOMY: POST OP INSTRUCTIONS ° °Always review your discharge instruction sheet given to you by the facility where your surgery was performed. ° °IF YOU HAVE DISABILITY OR FAMILY LEAVE FORMS, YOU MUST BRING THEM TO THE OFFICE FOR PROCESSING.  DO NOT GIVE THEM TO YOUR DOCTOR. ° °1. A prescription for pain medication may be given to you upon discharge.  Take your pain medication as prescribed, if needed.  If narcotic pain medicine is not needed, then you may take acetaminophen (Tylenol) or ibuprofen (Advil) as needed. °2. Take your usually prescribed medications unless otherwise directed °3. If you need a refill on your pain medication, please contact your pharmacy.  They will contact our office to request authorization.  Prescriptions will not be filled after 5pm or on week-ends. °4. You should eat very light the first 24 hours after surgery, such as soup, crackers, pudding, etc.  Resume your normal diet the day after surgery. °5. Most patients will experience some swelling and bruising in the breast.  Ice packs and a good support bra will help.  Swelling and bruising can take several days to resolve.  °6. It is common to experience some constipation if taking pain medication after surgery.  Increasing fluid intake and taking a stool softener will usually help or prevent this problem from occurring.  A mild laxative (Milk of Magnesia or Miralax) should be taken according to package directions if there are no bowel movements after 48 hours. °7. Unless discharge instructions indicate otherwise, you may remove your bandages 24-48 hours after surgery, and you may shower at that time.  You may have steri-strips (small skin tapes) in place directly over the incision.  These strips should be left on the skin for 7-10 days.  If your surgeon used skin glue on the incision, you may shower in 24 hours.  The glue will flake off over the  next 2-3 weeks.  Any sutures or staples will be removed at the office during your follow-up visit. °8. ACTIVITIES:  You may resume regular daily activities (gradually increasing) beginning the next day.  Wearing a good support bra or sports bra minimizes pain and swelling.  You may have sexual intercourse when it is comfortable. °a. You may drive when you no longer are taking prescription pain medication, you can comfortably wear a seatbelt, and you can safely maneuver your car and apply brakes. °b. RETURN TO WORK:  ______________________________________________________________________________________ °9. You should see your doctor in the office for a follow-up appointment approximately two weeks after your surgery.  Your doctor’s nurse will typically make your follow-up appointment when she calls you with your pathology report.  Expect your pathology report 2-3 business days after your surgery.  You may call to check if you do not hear from us after three days. °10. OTHER INSTRUCTIONS: _______________________________________________________________________________________________ _____________________________________________________________________________________________________________________________________ °_____________________________________________________________________________________________________________________________________ °_____________________________________________________________________________________________________________________________________ ° °WHEN TO CALL YOUR DOCTOR: °1. Fever over 101.0 °2. Nausea and/or vomiting. °3. Extreme swelling or bruising. °4. Continued bleeding from incision. °5. Increased pain, redness, or drainage from the incision. ° °The clinic staff is available to answer your questions during regular business hours.  Please don’t hesitate to call and ask to speak to one of the nurses for clinical concerns.  If you have a medical emergency, go to the nearest  emergency room or call 911.  A surgeon from [facility] is always on call at the hospital. ° °For further questions, please visit [email or web address]  ° ° °  Post Anesthesia Home Care Instructions ° °Activity: °Get plenty of rest for the remainder of the day. A responsible adult should stay with you for 24 hours following the procedure.  °For the next 24 hours, DO NOT: °-Drive a car °-Operate machinery °-Drink alcoholic beverages °-Take any medication unless instructed by your physician °-Make any legal decisions or sign important papers. ° °Meals: °Start with liquid foods such as gelatin or soup. Progress to regular foods as tolerated. Avoid greasy, spicy, heavy foods. If nausea and/or vomiting occur, drink only clear liquids until the nausea and/or vomiting subsides. Call your physician if vomiting continues. ° °Special Instructions/Symptoms: °Your throat may feel dry or sore from the anesthesia or the breathing tube placed in your throat during surgery. If this causes discomfort, gargle with warm salt water. The discomfort should disappear within 24 hours. ° °

## 2014-01-12 NOTE — Anesthesia Preprocedure Evaluation (Signed)
Anesthesia Evaluation  Patient identified by MRN, date of birth, ID band Patient awake    Reviewed: Allergy & Precautions, H&P , NPO status , Patient's Chart, lab work & pertinent test results  Airway Mallampati: I TM Distance: >3 FB Neck ROM: Full    Dental   Pulmonary Current Smoker,          Cardiovascular     Neuro/Psych    GI/Hepatic   Endo/Other    Renal/GU      Musculoskeletal   Abdominal   Peds  Hematology   Anesthesia Other Findings   Reproductive/Obstetrics                           Anesthesia Physical Anesthesia Plan  ASA: II  Anesthesia Plan: General   Post-op Pain Management:    Induction: Intravenous  Airway Management Planned: LMA  Additional Equipment:   Intra-op Plan:   Post-operative Plan: Extubation in OR  Informed Consent: I have reviewed the patients History and Physical, chart, labs and discussed the procedure including the risks, benefits and alternatives for the proposed anesthesia with the patient or authorized representative who has indicated his/her understanding and acceptance.     Plan Discussed with: CRNA and Surgeon  Anesthesia Plan Comments:         Anesthesia Quick Evaluation

## 2014-01-12 NOTE — Op Note (Signed)
eoperative diagnosis: Left breast granular tumor  Postoperative diagnosis: Same   Procedure: Left breast seed localized lumpectomy  Surgeon: Erroll Luna M.D.  Anesthesia: Gen. With 0.25% Sensorcaine local  EBL: 20 cc  Specimen: Left breast tissue with clip and radioactive seed in the specimen. Verified with neoprobe and radiographic image showing both seed and clip in specimen  Indications for procedure: The patient presents for left breast excisional lumpectomy after core biopsy showed granular cell tumor. Discussed the rationale for considering excision. Small risk of malignancy associated with this  lesion after core biopsy. Discussed observation. Discussed wire localization. Patient desired excision of left breast papilloma.The procedure has been discussed with the patient. Alternatives to surgery have been discussed with the patient.  Risks of surgery include bleeding,  Infection,  Seroma formation, death,  and the need for further surgery.   The patient understands and wishes to proceed.   Description of procedure: Patient underwent seed placement as an outpatient. Patient presents today for left breast seed localized lumpectomy. Patient and holding area. Questions are answered and neoprobe used to verify seed location. Patient taken back to the operating room and placed upon the OR table. After induction of general anesthesia, left breast prepped and draped in a sterile fashion. Timeout was done to verify proper sizing procedure. Neoprobe used and hot spot identified and left breast upper-outer quadrant. This was marked with pen. Curvilinear incision made left upper outer quadrant breast. Dissection used with the help of a neoprobe around the tissue where the seed and clip were located. Tissue removed in its entirety with gross margins.Marlis Edelson used and seen within specimen. Radiographs taken which show clip and seed  In specimen.hemostasis achieved and cavity closed with 3-0 Vicryl and  4-0 Monocryl. Dermabond applied. All final counts found to be correct. Specimen transported to pathology. Patient awoke extubated taken to recovery in satisfactory condition.

## 2014-01-12 NOTE — Interval H&P Note (Signed)
History and Physical Interval Note:  01/12/2014 11:57 AM  Brandi Cooke  has presented today for surgery, with the diagnosis of left breast granula tumor  The various methods of treatment have been discussed with the patient and family. After consideration of risks, benefits and other options for treatment, the patient has consented to  Procedure(s): LEFT BREAST LUMPECTOMY WITH RADIOACTIVE SEED LOCALIZATION (Left) as a surgical intervention .  The patient's history has been reviewed, patient examined, no change in status, stable for surgery.  I have reviewed the patient's chart and labs.  Questions were answered to the patient's satisfaction.     Shauntia Levengood A.

## 2014-01-13 ENCOUNTER — Encounter (HOSPITAL_BASED_OUTPATIENT_CLINIC_OR_DEPARTMENT_OTHER): Payer: Self-pay | Admitting: Surgery

## 2014-01-13 LAB — POCT HEMOGLOBIN-HEMACUE: Hemoglobin: 15 g/dL (ref 12.0–15.0)

## 2014-01-22 ENCOUNTER — Telehealth (INDEPENDENT_AMBULATORY_CARE_PROVIDER_SITE_OTHER): Payer: Self-pay

## 2014-01-22 ENCOUNTER — Encounter (INDEPENDENT_AMBULATORY_CARE_PROVIDER_SITE_OTHER): Payer: Self-pay

## 2014-01-22 NOTE — Telephone Encounter (Signed)
Called pt back. RTW note for Monday at front desk for pick up.

## 2014-01-22 NOTE — Telephone Encounter (Signed)
Message copied by Carlene Coria on Fri Jan 22, 2014 10:17 AM ------      Message from: Crisoforo Oxford      Created: Thu Jan 21, 2014  2:22 PM      Regarding: RELEASE BACK TO WORK       Contact: [phone]       Pt called to verify when can she return to work, pls call ty TT ------

## 2014-02-01 ENCOUNTER — Encounter (INDEPENDENT_AMBULATORY_CARE_PROVIDER_SITE_OTHER): Payer: Self-pay | Admitting: Surgery

## 2014-02-01 ENCOUNTER — Ambulatory Visit (INDEPENDENT_AMBULATORY_CARE_PROVIDER_SITE_OTHER): Payer: 59 | Admitting: Surgery

## 2014-02-01 VITALS — BP 118/80 | HR 70 | Resp 14 | Ht 67.0 in | Wt 154.6 lb

## 2014-02-01 DIAGNOSIS — Z9889 Other specified postprocedural states: Secondary | ICD-10-CM

## 2014-02-01 MED ORDER — OXYCODONE-ACETAMINOPHEN 5-325 MG PO TABS: 1.0000 | ORAL_TABLET | ORAL | Status: DC | PRN

## 2014-02-01 NOTE — Progress Notes (Signed)
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 Margins were clear in total dimension was 8 mm. She has some occasional discomfort at the lumpectomy site but otherwise is doing okay.  Exam: Left breast incision clean dry and intact without signs of redness or infection.  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 Resume full  Activity. Refill pain medication and recommend over-the-counter medication after this resolves. Yearly mammography.

## 2014-02-01 NOTE — Patient Instructions (Signed)
Resume full activity  

## 2014-05-31 ENCOUNTER — Encounter (INDEPENDENT_AMBULATORY_CARE_PROVIDER_SITE_OTHER): Payer: Self-pay | Admitting: Surgery

## 2014-09-25 ENCOUNTER — Ambulatory Visit: Payer: 59

## 2014-10-11 ENCOUNTER — Other Ambulatory Visit: Payer: Self-pay

## 2014-10-11 DIAGNOSIS — Z1231 Encounter for screening mammogram for malignant neoplasm of breast: Secondary | ICD-10-CM

## 2014-10-26 ENCOUNTER — Ambulatory Visit
Admission: RE | Admit: 2014-10-26 | Discharge: 2014-10-26 | Disposition: A | Discharge status: Home / Self Care | Payer: 59 | Source: Ambulatory Visit

## 2014-10-26 ENCOUNTER — Ambulatory Visit: Payer: 59

## 2014-10-26 DIAGNOSIS — Z1231 Encounter for screening mammogram for malignant neoplasm of breast: Secondary | ICD-10-CM

## 2014-12-06 ENCOUNTER — Other Ambulatory Visit (HOSPITAL_COMMUNITY)
Admission: RE | Admit: 2014-12-06 | Discharge: 2014-12-06 | Disposition: A | Discharge status: Home / Self Care | Payer: 59 | Source: Ambulatory Visit | Attending: Gynecology | Admitting: Gynecology

## 2014-12-06 ENCOUNTER — Ambulatory Visit (INDEPENDENT_AMBULATORY_CARE_PROVIDER_SITE_OTHER): Payer: 59 | Admitting: Gynecology

## 2014-12-06 ENCOUNTER — Encounter: Payer: Self-pay | Admitting: Gynecology

## 2014-12-06 VITALS — BP 124/78 | Ht 66.0 in | Wt 156.0 lb

## 2014-12-06 DIAGNOSIS — Z01419 Encounter for gynecological examination (general) (routine) without abnormal findings: Secondary | ICD-10-CM | POA: Insufficient documentation

## 2014-12-06 DIAGNOSIS — N926 Irregular menstruation, unspecified: Secondary | ICD-10-CM | POA: Diagnosis not present

## 2014-12-06 LAB — LIPID PANEL
Cholesterol: 189 mg/dL (ref 0–200)
HDL: 52 mg/dL (ref >=46)
LDL CALC: 102 mg/dL — AB (ref 0–99)
Total CHOL/HDL Ratio: 3.6 Ratio
Triglycerides: 175 mg/dL — ABNORMAL HIGH (ref <150)
VLDL: 35 mg/dL (ref 0–40)

## 2014-12-06 LAB — CBC WITH DIFFERENTIAL/PLATELET
BASOS ABS: 0.1 10*3/uL (ref 0.0–0.1)
BASOS PCT: 1 % (ref 0–1)
Eosinophils Absolute: 0.1 10*3/uL (ref 0.0–0.7)
Eosinophils Relative: 2 % (ref 0–5)
HCT: 39.8 % (ref 36.0–46.0)
Hemoglobin: 13 g/dL (ref 12.0–15.0)
LYMPHS PCT: 37 % (ref 12–46)
Lymphs Abs: 2.2 10*3/uL (ref 0.7–4.0)
MCH: 29.3 pg (ref 26.0–34.0)
MCHC: 32.7 g/dL (ref 30.0–36.0)
MCV: 89.8 fL (ref 78.0–100.0)
MONO ABS: 0.5 10*3/uL (ref 0.1–1.0)
MPV: 9.6 fL (ref 8.6–12.4)
Monocytes Relative: 9 % (ref 3–12)
NEUTROS ABS: 3 10*3/uL (ref 1.7–7.7)
NEUTROS PCT: 51 % (ref 43–77)
PLATELETS: 254 10*3/uL (ref 150–400)
RBC: 4.43 MIL/uL (ref 3.87–5.11)
RDW: 15.4 % (ref 11.5–15.5)
WBC: 5.9 10*3/uL (ref 4.0–10.5)

## 2014-12-06 LAB — COMPREHENSIVE METABOLIC PANEL
ALBUMIN: 4.1 g/dL (ref 3.5–5.2)
ALK PHOS: 65 U/L (ref 39–117)
ALT: 16 U/L (ref 0–35)
AST: 18 U/L (ref 0–37)
BILIRUBIN TOTAL: 0.3 mg/dL (ref 0.2–1.2)
BUN: 9 mg/dL (ref 6–23)
CO2: 28 mEq/L (ref 19–32)
Calcium: 9.4 mg/dL (ref 8.4–10.5)
Chloride: 107 mEq/L (ref 96–112)
Creat: 0.83 mg/dL (ref 0.50–1.10)
GLUCOSE: 62 mg/dL — AB (ref 70–99)
Potassium: 4.6 mEq/L (ref 3.5–5.3)
Sodium: 141 mEq/L (ref 135–145)
Total Protein: 6.9 g/dL (ref 6.0–8.3)

## 2014-12-06 LAB — TSH: TSH: 1.931 u[IU]/mL (ref 0.350–4.500)

## 2014-12-06 NOTE — Addendum Note (Signed)
Addended by: Nelva Nay on: 12/06/2014 12:17 PM   Modules accepted: Orders

## 2014-12-06 NOTE — Progress Notes (Signed)
Brandi Cooke 12/05/1965 [id number]        49 y.o.  G1P1001 for annual exam.  Several issues noted below.  Past medical history,surgical history, problem list, medications, allergies, family history and social history were all reviewed and documented as reviewed in the EPIC chart.  ROS:  Performed with pertinent positives and negatives included in the history, assessment and plan.   Additional significant findings :  none   Exam: Kim Counsellor Vitals:   12/06/14 1135  BP: 124/78  Height: 5\' 6"  (1.676 m)  Weight: 156 lb (70.761 kg)   General appearance:  Normal affect, orientation and appearance. Skin: Grossly normal HEENT: Without gross lesions.  No cervical or supraclavicular adenopathy. Thyroid normal.  Lungs:  Clear without wheezing, rales or rhonchi Cardiac: RR, without RMG Abdominal:  Soft, nontender, without masses, guarding, rebound, organomegaly or hernia Breasts:  Examined lying and sitting without masses, retractions, discharge or axillary adenopathy. Pelvic:  Ext/BUS/vagina normal  Cervix normal. Pap smear done  Uterus retroverted, globoid, approximate 8 week size  Adnexa  Without masses or tenderness    Anus and perineum  Normal   Rectovaginal  Normal sphincter tone without palpated masses or tenderness.    Assessment/Plan:  49 y.o. G55P1001 female for annual exam with irregular menses, not sexually active.   1. Irregular menses. Patient notes the past year or so her menses have become more irregular. She remembers her last menstrual period approximately in March. Is not sexually active. Notes some spotting in between. History of leiomyoma status post myomectomy. Exam shows uterus enlarged approximately 8 weeks size. Similar to her prior exam 2014. She is having some hot flushes. Will check baseline labs to include TSH FSH prolactin and sonohysterogram. I reviewed with the patient was involved with the procedure and she will go and schedule  this. 2. Contraception. Patient is not sexually active. History of blocked tubes on HSG before her myomectomy but never followed up for HSG afterwards. She declines any further evaluation at this point and does not desire contraception as she plans to remain without intercourse. 3. Mammogram 09/2014.  History of granular cell tumor excision left breast 2015.  Follow up with surgeon with no recommended further treatment or special follow up. Exam today is normal. Continue with annual mammography. SBE monthly reviewed. 4. Pap smear/HPV negative 2013. Pap smear done today. No history of significant abnormal Pap smears previously. 5. Health maintenance. Has not had routine blood work done in a long time. Baseline CBC comprehensive metabolic panel lipid profile urinalysis ordered with above lab work. Follow up for sonohysterogram.     Anastasio Auerbach MD, 12:02 PM 12/06/2014

## 2014-12-06 NOTE — Patient Instructions (Signed)
Follow up for ultrasound as scheduled.  You may obtain a copy of any labs that were done today by logging onto MyChart as outlined in the instructions provided with your AVS (after visit summary). The office will not call with normal lab results but certainly if there are any significant abnormalities then we will contact you.   Health Maintenance, Female A healthy lifestyle and preventative care can promote health and wellness.  Maintain regular health, dental, and eye exams.  Eat a healthy diet. Foods like vegetables, fruits, whole grains, low-fat dairy products, and lean protein foods contain the nutrients you need without too many calories. Decrease your intake of foods high in solid fats, added sugars, and salt. Get information about a proper diet from your caregiver, if necessary.  Regular physical exercise is one of the most important things you can do for your health. Most adults should get at least 150 minutes of moderate-intensity exercise (any activity that increases your heart rate and causes you to sweat) each week. In addition, most adults need muscle-strengthening exercises on 2 or more days a week.   Maintain a healthy weight. The body mass index (BMI) is a screening tool to identify possible weight problems. It provides an estimate of body fat based on height and weight. Your caregiver can help determine your BMI, and can help you achieve or maintain a healthy weight. For adults 20 years and older:  A BMI below 18.5 is considered underweight.  A BMI of 18.5 to 24.9 is normal.  A BMI of 25 to 29.9 is considered overweight.  A BMI of 30 and above is considered obese.  Maintain normal blood lipids and cholesterol by exercising and minimizing your intake of saturated fat. Eat a balanced diet with plenty of fruits and vegetables. Blood tests for lipids and cholesterol should begin at age 20 and be repeated every 5 years. If your lipid or cholesterol levels are high, you are over  50, or you are a high risk for heart disease, you may need your cholesterol levels checked more frequently.Ongoing high lipid and cholesterol levels should be treated with medicines if diet and exercise are not effective.  If you smoke, find out from your caregiver how to quit. If you do not use tobacco, do not start.  Lung cancer screening is recommended for adults aged 55 80 years who are at high risk for developing lung cancer because of a history of smoking. Yearly low-dose computed tomography (CT) is recommended for people who have at least a 30-pack-year history of smoking and are a current smoker or have quit within the past 15 years. A pack year of smoking is smoking an average of 1 pack of cigarettes a day for 1 year (for example: 1 pack a day for 30 years or 2 packs a day for 15 years). Yearly screening should continue until the smoker has stopped smoking for at least 15 years. Yearly screening should also be stopped for people who develop a health problem that would prevent them from having lung cancer treatment.  If you are pregnant, do not drink alcohol. If you are breastfeeding, be very cautious about drinking alcohol. If you are not pregnant and choose to drink alcohol, do not exceed 1 drink per day. One drink is considered to be 12 ounces (355 mL) of beer, 5 ounces (148 mL) of wine, or 1.5 ounces (44 mL) of liquor.  Avoid use of street drugs. Do not share needles with anyone. Ask for help if   if you need support or instructions about stopping the use of drugs.  High blood pressure causes heart disease and increases the risk of stroke. Blood pressure should be checked at least every 1 to 2 years. Ongoing high blood pressure should be treated with medicines, if weight loss and exercise are not effective.  If you are 55 to 47 years old, ask your caregiver if you should take aspirin to prevent strokes.  Diabetes screening involves taking a blood sample to check your fasting blood sugar level.  This should be done once every 3 years, after age 45, if you are within normal weight and without risk factors for diabetes. Testing should be considered at a younger age or be carried out more frequently if you are overweight and have at least 1 risk factor for diabetes.  Breast cancer screening is essential preventative care for women. You should practice "breast self-awareness." This means understanding the normal appearance and feel of your breasts and may include breast self-examination. Any changes detected, no matter how small, should be reported to a caregiver. Women in their 20s and 30s should have a clinical breast exam (CBE) by a caregiver as part of a regular health exam every 1 to 3 years. After age 40, women should have a CBE every year. Starting at age 40, women should consider having a mammogram (breast X-ray) every year. Women who have a family history of breast cancer should talk to their caregiver about genetic screening. Women at a high risk of breast cancer should talk to their caregiver about having an MRI and a mammogram every year.  Breast cancer gene (BRCA)-related cancer risk assessment is recommended for women who have family members with BRCA-related cancers. BRCA-related cancers include breast, ovarian, tubal, and peritoneal cancers. Having family members with these cancers may be associated with an increased risk for harmful changes (mutations) in the breast cancer genes BRCA1 and BRCA2. Results of the assessment will determine the need for genetic counseling and BRCA1 and BRCA2 testing.  The Pap test is a screening test for cervical cancer. Women should have a Pap test starting at age 21. Between ages 21 and 29, Pap tests should be repeated every 2 years. Beginning at age 30, you should have a Pap test every 3 years as long as the past 3 Pap tests have been normal. If you had a hysterectomy for a problem that was not cancer or a condition that could lead to cancer, then you no  longer need Pap tests. If you are between ages 65 and 70, and you have had normal Pap tests going back 10 years, you no longer need Pap tests. If you have had past treatment for cervical cancer or a condition that could lead to cancer, you need Pap tests and screening for cancer for at least 20 years after your treatment. If Pap tests have been discontinued, risk factors (such as a new sexual partner) need to be reassessed to determine if screening should be resumed. Some women have medical problems that increase the chance of getting cervical cancer. In these cases, your caregiver may recommend more frequent screening and Pap tests.  The human papillomavirus (HPV) test is an additional test that may be used for cervical cancer screening. The HPV test looks for the virus that can cause the cell changes on the cervix. The cells collected during the Pap test can be tested for HPV. The HPV test could be used to screen women aged 30 years and older, and   be used in women of any age who have unclear Pap test results. After the age of 63, women should have HPV testing at the same frequency as a Pap test.  Colorectal cancer can be detected and often prevented. Most routine colorectal cancer screening begins at the age of 67 and continues through age 18. However, your caregiver Emel recommend screening at an earlier age if you have risk factors for colon cancer. On a yearly basis, your caregiver Vila provide home test kits to check for hidden blood in the stool. Use of a small camera at the end of a tube, to directly examine the colon (sigmoidoscopy or colonoscopy), can detect the earliest forms of colorectal cancer. Talk to your caregiver about this at age 65, when routine screening begins. Direct examination of the colon should be repeated every 5 to 10 years through age 70, unless early forms of pre-cancerous polyps or small growths are found.  Hepatitis C blood testing is recommended for all people born from  [birth year] through 1963 and any individual with known risks for hepatitis C.  Practice safe sex. Use condoms and avoid high-risk sexual practices to reduce the spread of sexually transmitted infections (STIs). Sexually active women aged 26 and younger should be checked for Chlamydia, which is a common sexually transmitted infection. Older women with new or multiple partners should also be tested for Chlamydia. Testing for other STIs is recommended if you are sexually active and at increased risk.  Osteoporosis is a disease in which the bones lose minerals and strength with aging. This can result in serious bone fractures. The risk of osteoporosis can be identified using a bone density scan. Women ages 9 and over and women at risk for fractures or osteoporosis should discuss screening with their caregivers. Ask your caregiver whether you should be taking a calcium supplement or vitamin D to reduce the rate of osteoporosis.  Menopause can be associated with physical symptoms and risks. Hormone replacement therapy is available to decrease symptoms and risks. You should talk to your caregiver about whether hormone replacement therapy is right for you.  Use sunscreen. Apply sunscreen liberally and repeatedly throughout the day. You should seek shade when your shadow is shorter than you. Protect yourself by wearing long sleeves, pants, a wide-brimmed hat, and sunglasses year round, whenever you are outdoors.  Notify your caregiver of new moles or changes in moles, especially if there is a change in shape or color. Also notify your caregiver if a mole is larger than the size of a pencil eraser.  Stay current with your immunizations. Document Released: 01/29/2011 Document Revised: 11/10/2012 Document Reviewed: 01/29/2011 [facility] Patient Information 2014 [employer].

## 2014-12-07 LAB — URINALYSIS W MICROSCOPIC + REFLEX CULTURE
BACTERIA UA: NONE SEEN
Bilirubin Urine: NEGATIVE
CASTS: NONE SEEN
Crystals: NONE SEEN
Glucose, UA: NEGATIVE mg/dL
KETONES UR: NEGATIVE mg/dL
Leukocytes, UA: NEGATIVE
NITRITE: NEGATIVE
Protein, ur: NEGATIVE mg/dL
Specific Gravity, Urine: 1.022 (ref 1.005–1.030)
Urobilinogen, UA: 0.2 mg/dL (ref 0.0–1.0)
pH: 5 (ref 5.0–8.0)

## 2014-12-07 LAB — FOLLICLE STIMULATING HORMONE: FSH: 81.2 m[IU]/mL

## 2014-12-07 LAB — PROLACTIN: Prolactin: 2.7 ng/mL

## 2014-12-08 LAB — CYTOLOGY - PAP

## 2014-12-13 ENCOUNTER — Ambulatory Visit (INDEPENDENT_AMBULATORY_CARE_PROVIDER_SITE_OTHER): Payer: 59 | Admitting: Family Medicine

## 2014-12-13 ENCOUNTER — Other Ambulatory Visit: Payer: Self-pay | Admitting: Gynecology

## 2014-12-13 VITALS — BP 122/84 | HR 62 | Temp 98.5°F | Resp 18 | Ht 64.0 in | Wt 156.0 lb

## 2014-12-13 DIAGNOSIS — K088 Other specified disorders of teeth and supporting structures: Secondary | ICD-10-CM | POA: Diagnosis not present

## 2014-12-13 DIAGNOSIS — R6883 Chills (without fever): Secondary | ICD-10-CM

## 2014-12-13 DIAGNOSIS — K0510 Chronic gingivitis, plaque induced: Secondary | ICD-10-CM

## 2014-12-13 DIAGNOSIS — K0889 Other specified disorders of teeth and supporting structures: Secondary | ICD-10-CM

## 2014-12-13 DIAGNOSIS — N926 Irregular menstruation, unspecified: Secondary | ICD-10-CM

## 2014-12-13 MED ORDER — AMOXICILLIN 500 MG PO CAPS: 500.0000 mg | ORAL_CAPSULE | Freq: Two times a day (BID) | ORAL | Status: DC

## 2014-12-13 MED ORDER — HYDROCODONE-ACETAMINOPHEN 5-325 MG PO TABS
1.0000 | ORAL_TABLET | Freq: Three times a day (TID) | ORAL | Status: DC | PRN
Start: 2014-12-13 — End: 2016-11-19

## 2014-12-13 NOTE — Progress Notes (Signed)
Chief Complaint:  Chief Complaint  Patient presents with  . Dental Pain    Tooth, left side, X last night, needs work note for today, no relief with Tylenol or Ibuprofen    HPI: Brandi Cooke is a 49 y.o. female who is here for  Left sided tooth pain, redness, and also 9/10 pain, she has had it before in Feb, has taken tylenol and  ibuprofen without releif.  She was palinning to go to [employer] to get her teeth pulled at the school.  She has had sensitivities to cold and hot liquids and has been unable to eat foods. She was not able to get her routine checked with a dentist in February since she had no insurance. She currently has insurance now and is working at [employer].  Past Medical History  Diagnosis Date  . Glaucoma     Open  . Fibroid   . Bell's palsy   . Allergy   . Anemia   . Stroke    Past Surgical History  Procedure Laterality Date  . Myomectomy  10/2005  . Pelvic laparoscopy      Expl. Lap.  . Refractive surgery    . Hysteroscopy  2002    myomectomy  . Breast lumpectomy with radioactive seed localization Left 01/12/2014    Procedure: LEFT BREAST LUMPECTOMY WITH RADIOACTIVE SEED LOCALIZATION;  Surgeon: Joyice Faster. Cornett, MD;  Location: [facility];  Service: General;  Laterality: Left;  . Breast surgery      Cyst removed   History   Social History  . Marital Status: Single    Spouse Name: N/A  . Number of Children: N/A  . Years of Education: N/A   Social History Main Topics  . Smoking status: Current Every Day Smoker -- 0.50 packs/day    Types: Cigarettes  . Smokeless tobacco: Not on file  . Alcohol Use: 1.8 oz/week    3 Standard drinks or equivalent per week  . Drug Use: No  . Sexual Activity: No     Comment: 1st intercourse 49 yo-Fewer than 5 partners   Other Topics Concern  . None   Social History Narrative   Family History  Problem Relation Age of Onset  . Hypertension Mother   . Diabetes Mother   . Stroke  Brother   . Migraines Brother   . Cancer Maternal Uncle     Unknown type  . Migraines Brother   . Heart disease Brother   . Thyroid disease Brother    Allergies  Allergen Reactions  . Latex Itching and Rash   Prior to Admission medications   Medication Sig Start Date End Date Taking? Authorizing Provider  acetaminophen (TYLENOL) 500 MG tablet Take 1 tablet (500 mg total) by mouth every 6 (six) hours as needed. 09/10/13  Yes Kaitlyn Szekalski, PA-C  ibuprofen (ADVIL,MOTRIN) 200 MG tablet Take 200 mg by mouth every 6 (six) hours as needed.   Yes Historical Provider, MD  triamterene-hydrochlorothiazide (DYAZIDE) 37.5-25 MG per capsule Take 1 capsule by mouth daily.   Yes Historical Provider, MD  oxyCODONE-acetaminophen (ROXICET) 5-325 MG per tablet Take 1-2 tablets by mouth every 4 (four) hours as needed. Patient not taking: Reported on 12/06/2014 02/01/14   Erroll Luna, MD  vitamin B-12 (CYANOCOBALAMIN) 500 MCG tablet Take 500 mcg by mouth daily.    Historical Provider, MD     ROS: The patient denies fevers,  night sweats, unintentional weight loss, chest pain, palpitations,  wheezing, dyspnea on exertion, nausea, vomiting, abdominal pain, dysuria, hematuria, melena, numbness, weakness, or tingling. Positive for chills  All other systems have been reviewed and were otherwise negative with the exception of those mentioned in the HPI and as above.    PHYSICAL EXAM: Filed Vitals:   12/13/14 1531  BP: 122/84  Pulse: 62  Temp: 98.5 F (36.9 C)  Resp: 18   Filed Vitals:   12/13/14 1531  Height: 5' 4"  (1.626 m)  Weight: 156 lb (70.761 kg)   Body mass index is 26.76 kg/(m^2).  General: Alert, no acute distress HEENT:  Normocephalic, atraumatic, oropharynx patent. EOMI, PERRLA Left dental caries with gingival inflammation of the bottom to back molars. Minimal swelling of the jaw line. Obstruction of the airway. Cardiovascular:  Regular rate and rhythm, no rubs murmurs or gallops.  No  Carotid bruits, radial pulse intact. No pedal edema.  Respiratory: Clear to auscultation bilaterally.  No wheezes, rales, or rhonchi.  No cyanosis, no use of accessory musculature GI: No organomegaly, abdomen is soft and non-tender, positive bowel sounds.  No masses. Skin: No rashes. Neurologic: Facial musculature is minimally asymmetric on the left side due to history of Bell's palsy. Psychiatric: Patient is appropriate throughout our interaction. Lymphatic: No cervical lymphadenopathy Musculoskeletal: Gait intact.   LABS: Results for orders placed or performed in visit on 12/06/14  CBC with Differential/Platelet  Result Value Ref Range   WBC 5.9 4.0 - 10.5 K/uL   RBC 4.43 3.87 - 5.11 MIL/uL   Hemoglobin 13.0 12.0 - 15.0 g/dL   HCT 39.8 36.0 - 46.0 %   MCV 89.8 78.0 - 100.0 fL   MCH 29.3 26.0 - 34.0 pg   MCHC 32.7 30.0 - 36.0 g/dL   RDW 15.4 11.5 - 15.5 %   Platelets 254 150 - 400 K/uL   MPV 9.6 8.6 - 12.4 fL   Neutrophils Relative % 51 43 - 77 %   Neutro Abs 3.0 1.7 - 7.7 K/uL   Lymphocytes Relative 37 12 - 46 %   Lymphs Abs 2.2 0.7 - 4.0 K/uL   Monocytes Relative 9 3 - 12 %   Monocytes Absolute 0.5 0.1 - 1.0 K/uL   Eosinophils Relative 2 0 - 5 %   Eosinophils Absolute 0.1 0.0 - 0.7 K/uL   Basophils Relative 1 0 - 1 %   Basophils Absolute 0.1 0.0 - 0.1 K/uL   Smear Review Criteria for review not met   Comprehensive metabolic panel  Result Value Ref Range   Sodium 141 135 - 145 mEq/L   Potassium 4.6 3.5 - 5.3 mEq/L   Chloride 107 96 - 112 mEq/L   CO2 28 19 - 32 mEq/L   Glucose, Bld 62 (L) 70 - 99 mg/dL   BUN 9 6 - 23 mg/dL   Creat 0.83 0.50 - 1.10 mg/dL   Total Bilirubin 0.3 0.2 - 1.2 mg/dL   Alkaline Phosphatase 65 39 - 117 U/L   AST 18 0 - 37 U/L   ALT 16 0 - 35 U/L   Total Protein 6.9 6.0 - 8.3 g/dL   Albumin 4.1 3.5 - 5.2 g/dL   Calcium 9.4 8.4 - 18.8 mg/dL  Follicle stimulating hormone  Result Value Ref Range   FSH 81.2 mIU/mL  Lipid panel  Result Value  Ref Range   Cholesterol 189 0 - 200 mg/dL   Triglycerides 175 (H) <150 mg/dL   HDL 52 >=46 mg/dL   Total CHOL/HDL Ratio 3.6 Ratio  VLDL 35 0 - 40 mg/dL   LDL Cholesterol 102 (H) 0 - 99 mg/dL  Urinalysis w microscopic + reflex cultur  Result Value Ref Range   Color, Urine YELLOW YELLOW   APPearance CLEAR CLEAR   Specific Gravity, Urine 1.022 1.005 - 1.030   pH 5.0 5.0 - 8.0   Glucose, UA NEG NEG mg/dL   Bilirubin Urine NEG NEG   Ketones, ur NEG NEG mg/dL   Hgb urine dipstick SMALL (A) NEG   Protein, ur NEG NEG mg/dL   Urobilinogen, UA 0.2 0.0 - 1.0 mg/dL   Nitrite NEG NEG   Leukocytes, UA NEG NEG   Squamous Epithelial / LPF FEW RARE   Crystals NONE SEEN NONE SEEN   Casts NONE SEEN NONE SEEN   WBC, UA 0-2 <3 WBC/hpf   RBC / HPF 0-2 <3 RBC/hpf   Bacteria, UA NONE SEEN RARE  TSH  Result Value Ref Range   TSH 1.931 0.350 - 4.500 uIU/mL  Prolactin  Result Value Ref Range   Prolactin 2.7 ng/mL  Cytology - PAP  Result Value Ref Range   CYTOLOGY - PAP PAP RESULT      EKG/XRAY:   Primary read interpreted by Dr. Marin Comment at [facility].   ASSESSMENT/PLAN: Encounter Diagnoses  Name Primary?  Marland Kitchen Tooth pain Yes  . Chills (without fever)   . Gingivitis    Prescribed amoxicillin, Norco She was given Norco in the past for tooth pain and tolerated well [place] Controlled substance profile was pulled and there is no illegal activities Follow-up with dentist, resources given Follow up when necessary  Gross sideeffects, risk and benefits, and alternatives of medications d/w patient. Patient is aware that all medications have potential sideeffects and we are unable to predict every sideeffect or drug-drug interaction that may occur.  LE, [place], DO 12/13/2014 4:19 PM

## 2014-12-13 NOTE — Patient Instructions (Signed)
RESOURCE GUIDE ° °Chronic Pain Problems: °Contact [facility]  [phone] °Patients need to be referred by their primary care doctor. ° °Insufficient Money for Medicine: °Contact United Way:  call [phone]) 892-1162 ° °No Primary Care Doctor: °- Call [facility]  [phone] - can help you locate a primary care doctor that  accepts your insurance, provides certain services, etc. °- Physician Referral Service- 1-800-533-3463 ° °Agencies that provide inexpensive medical care: °- [facility]  [phone] °- [facility]  [phone] °- [facility]  [phone] °- [facility]  [phone] °- [facility] Parenthood  [phone] °- [facility]  [phone] ° °Medicaid-accepting [place] Providers: °- [facility]- [address] ° [phone], Mon-Fri 9am-7pm, Sat 9am-1pm °- [facility]- [address], [address] ° [phone] °- [facility]- [address], [address] ° [phone] °- [facility]- [address] [place] [address] ° [phone] °- [facility]- [address], [phone] ° Only accepts [place] Access Medicaid patients after they have their name  applied to their card ° °Self Pay (no insurance) in [place]: °- Sickle Cell Patients - [facility] ° [address], [phone] °- [facility]- [address] ° [phone] °      -     [facility]- [address] °      -     [facility]- see information above (Speak to Pam H if you do not have insurance) °      -  [facility] [place]- [address],  [phone] °      -  [facility]- [address] [place] [address], [phone] °      -  Dr Osei-Bonsu-  [address], [place], [phone] °      -  [facility] - [address], [phone] °      -  [facility]- [address] [place] [address], [phone], also  [address], [phone] °      -     [facility]- [address], [phone], 1st & 3rd Saturday °        every month, 10am-1pm ° -     [facility] °  [address], [place]. °  Phone:  [phone], Fax:  [phone]. Hours of Operation:  9 am - 6 pm, M-F. ° -     [facility] °  [address], [place] °  Phone: [phone], Fax: [phone]. Hours of Operation:  8:30 am - 5:30 pm, M-F. ° °[facility] °[address] °[place], [place] [address] °(336) [phone] ° °[facility] °[address] °[place], [place] [address] °(336) [phone] ° °1) Find a Doctor and Pay Out of Pocket °Although you won't have to find out who is covered by your insurance plan, it is a good idea to ask around and get recommendations. You will then need to call the office and see if the doctor you have chosen will accept you as a new patient and what types of options they offer for patients who are self-pay. Some doctors offer discounts or will set up payment plans for their patients who do not have insurance, but   you will need to ask so you aren't surprised when you get to your appointment. ° °2) Contact Your Local Health Department °Not all health departments have doctors that can see patients for sick visits, but many do, so it is worth a call to see if yours does. If you don't know where your local health department is, you can check in your phone book. The CDC also has a tool to help you locate your state's health department, and many state websites also have listings of all of their local health departments. ° °3) Find a Walk-in Clinic °If your illness is not likely to be very severe or complicated, you may want to try a walk in clinic. These are popping up all over the country in pharmacies, drugstores, and shopping centers. They're usually staffed by nurse practitioners or physician assistants that have been trained  to treat common illnesses and complaints. They're usually fairly quick and inexpensive. However, if you have serious medical issues or chronic medical problems, these are probably not your best option ° °STD Testing °- [place] [facility] [place], [address], [place], phone [phone] or [phone].  Monday - Friday, call for an appointment. °- [place] [facility] [place], [facility], [address], [place], phone [phone] or [phone].  Monday - Friday, call for an appointment. ° °Abuse/Neglect: °- [place] Child Abuse Hotline (336) 641-3795 °- [place] Child Abuse Hotline 800-378-5315 (After Hours) ° °Emergency Shelter:  [facility] (336) 271-5985 ° °Maternity Homes: °- [facility] (336) 275-9566 °- [facility] (704) 372-4663 ° °MRSA Hotline #:   832-7006 ° °Dental Assistance °If unable to pay or uninsured, contact:  [place] [facility] to become qualified for the adult dental clinic. ° °Patients with Medicaid: [facility] °[address], [phone] °[address], [phone] ° °If unable to pay, or uninsured, contact [place] [facility] ([phone] in [place], [phone] in [place]) to become qualified for the adult dental clinic ° °[facility] °[address] °[place], [place] [address] °(336) 272-4177 °www.[facility].com ° °Other Low-Cost Community Dental Services: °- [facility]- [address], [place], [place], [address], [phone], Ext. 123, 2nd and 4th Thursday of the month at 6:30am.  10 clients each day by appointment, can sometimes see walk-in patients if someone does not show for an appointment. °- [facility]- [address], [place], [place], [address], [phone] °- [facility]- [address], [place], [place], [address], [phone] °- [place]  [facility]- 342-8273 °- [facility]- 703-3100 °- [place] [facility]- 570-6415 ° °     Behavioral Health Resources in the Community ° °Intensive Outpatient Programs: °[facility]      °[address] °[place], [place] °336-878-6098 °Both a day and evening program °      °[facility]     °[address]        °[place], [place] [address] °336-832-9800        ° °[facility] °[address] °[place] °336-882-2125 ° °[place] [facility] °ACCESS LINE: 1-800-853-5163 or 336-641-4981 °[address] °,  [address] °Http://www.[email or web address].com/services/adult.htm ° ° °Substance Abuse Resources: °- [facility]  336-882-2125 °- [facility] 336-784-9470 °- [facility] 336-285-9073 °- [facility] 336-845-3988 °-   Residential & Outpatient Substance Abuse Program  800-659-3381 ° °Psychological Services: °- [facility]  832-9600 °- [facility]  378-7881 °- [facility], [address], [place], ACCESS LINE: 1-800-853-5163 or 336-641-4981, Http://www.[facility].com/services/adult.htm ° °Mobile Crisis Teams:         °                               °[facility]         °Mobile Crisis Care Unit °1-877-626-1772       °      °Assertive °[facility] °[address] [place] °336-834-9664 °                                        °[facility]vention[facility] °Sharon DeEsch °[address] °[place] °336-554-5454 ° °Self-Help/Support Groups: °[facility] Variety of support groups °373-1402 (call for more info) ° °Narcotics Anonymous (NA) °[facility] °[address] °[place] - 2 meetings at this location ° °Residential Treatment Programs:  °[facility]      °[address]        °[place]       °866-801-8205        ° °[facility] °[address] °[place], [place]  [address] °704-293-8524 ° °[facility]  °[address] °[place], [place] [address] °336-845-3988 °Admissions: 8am-3pm M-F ° °[facility]     °[address]        °[place], [place] [address]       °336-841-1104        ° °[facility] °[address] °[place], [place] °336-379-7146 ° °[facility] °[address] °[place], [place] °336-285-9073 ° °[facility] - Intensive Outpatient      °[address]     °[place], [place]       °852-3033        ° °[facility]     °[address] °[place], [place] °877-615-2722 or 336-784-9470 ° °[facility] ([facility]), Medicaid °[address] °[place], [place] °336-227-7417 ° °[facility]                                               °[address] ° [place] °800-659-3381 ° °[place] [facility] Resources: °[facility]- 1-888-581-9988              ° °General Therapy                                                °Julie Brannon, PhD        °[address]                                       °[place], [place] [address]         °336-349-5553   °Insurance ° °[facility]   °[address] °[place], [place] [address] °336-349-4454 ° °[facility] °[address] [place], [place] [address] °336-342-8316 °Insurance/Medicaid/sponsorship through [facility] ° °[facility]                                              °[address]                                        °[place], [place] [address]    °Therapy/tele-psych/case         °336-342-8316        °  °[facility] °[address]  ° [place], [place]  [address]  °Adolescent/group home/case management °336-349-2233  °                                         °Julia Brannon PhD       °General therapy       °Insurance   °336-951-0000        ° °Dr. Arfeen, Insurance, M-F °336- 349-4544 ° °[facility] [place]  [facility]  [employer] [place] [facility] °[address]                 [address]         [address]  °[address]                                               [place] °Phone:  [phone]                                  Phone:  [phone]                   Phone:  342-8140 ° °[facility], 342-8316 °- [place] [facility] - [facility]- 1-888-581-9988 °      -     [facility] in [place], [address], °            336-349-4454, Insurance ° °[place] Child Abuse Hotline °(336) 342-1394 or (336) 342-3537 (After Hours) ° ° °

## 2014-12-28 ENCOUNTER — Telehealth: Payer: Self-pay | Admitting: Gynecology

## 2014-12-28 NOTE — Telephone Encounter (Signed)
12/28/14-Pt was advised today that her U[employer] ins covers the sonohysterogram under her $40 copay and 20% coins on the procedure total of $875.76 or $175.16. If biopsy needed it would be additional $53.93. She said she can pay $70 towards the coins amount date of service. She knows to advise [place] if she cannot make this payment ahead of time.wl

## 2015-01-03 ENCOUNTER — Ambulatory Visit: Payer: 59 | Admitting: Gynecology

## 2015-01-03 ENCOUNTER — Other Ambulatory Visit: Payer: 59

## 2015-02-07 ENCOUNTER — Ambulatory Visit: Payer: 59 | Admitting: Gynecology

## 2015-02-07 ENCOUNTER — Other Ambulatory Visit: Payer: 59

## 2016-05-04 ENCOUNTER — Other Ambulatory Visit: Payer: Self-pay | Admitting: Gynecology

## 2016-05-04 DIAGNOSIS — Z1231 Encounter for screening mammogram for malignant neoplasm of breast: Secondary | ICD-10-CM

## 2016-05-24 ENCOUNTER — Ambulatory Visit: Payer: Self-pay

## 2016-05-24 ENCOUNTER — Ambulatory Visit
Admission: RE | Admit: 2016-05-24 | Discharge: 2016-05-24 | Disposition: A | Discharge status: Home / Self Care | Payer: Medicaid Other | Source: Ambulatory Visit | Attending: Gynecology | Admitting: Gynecology

## 2016-05-24 DIAGNOSIS — Z1231 Encounter for screening mammogram for malignant neoplasm of breast: Secondary | ICD-10-CM

## 2016-06-28 ENCOUNTER — Ambulatory Visit (INDEPENDENT_AMBULATORY_CARE_PROVIDER_SITE_OTHER): Payer: Medicaid Other | Admitting: Gynecology

## 2016-06-28 ENCOUNTER — Encounter: Payer: Self-pay | Admitting: Gynecology

## 2016-06-28 VITALS — BP 120/74 | Ht 66.0 in | Wt 158.0 lb

## 2016-06-28 DIAGNOSIS — Z13220 Encounter for screening for lipoid disorders: Secondary | ICD-10-CM

## 2016-06-28 DIAGNOSIS — Z308 Encounter for other contraceptive management: Secondary | ICD-10-CM | POA: Diagnosis not present

## 2016-06-28 DIAGNOSIS — Z01419 Encounter for gynecological examination (general) (routine) without abnormal findings: Secondary | ICD-10-CM

## 2016-06-28 LAB — LIPID PANEL
Cholesterol: 178 mg/dL (ref <200)
HDL: 47 mg/dL — ABNORMAL LOW (ref >50)
LDL CALC: 91 mg/dL (ref <100)
Total CHOL/HDL Ratio: 3.8 Ratio (ref <5.0)
Triglycerides: 201 mg/dL — ABNORMAL HIGH (ref <150)
VLDL: 40 mg/dL — AB (ref <30)

## 2016-06-28 LAB — CBC WITH DIFFERENTIAL/PLATELET
Basophils Absolute: 0 cells/uL (ref 0–200)
Basophils Relative: 0 %
EOS PCT: 2 %
Eosinophils Absolute: 108 cells/uL (ref 15–500)
HCT: 40 % (ref 35.0–45.0)
Hemoglobin: 13.4 g/dL (ref 11.7–15.5)
LYMPHS PCT: 48 %
Lymphs Abs: 2592 cells/uL (ref 850–3900)
MCH: 29.8 pg (ref 27.0–33.0)
MCHC: 33.5 g/dL (ref 32.0–36.0)
MCV: 88.9 fL (ref 80.0–100.0)
MONOS PCT: 7 %
MPV: 9.5 fL (ref 7.5–12.5)
Monocytes Absolute: 378 cells/uL (ref 200–950)
NEUTROS ABS: 2322 {cells}/uL (ref 1500–7800)
Neutrophils Relative %: 43 %
Platelets: 276 10*3/uL (ref 140–400)
RBC: 4.5 MIL/uL (ref 3.80–5.10)
RDW: 14.2 % (ref 11.0–15.0)
WBC: 5.4 10*3/uL (ref 3.8–10.8)

## 2016-06-28 LAB — COMPREHENSIVE METABOLIC PANEL
ALT: 16 U/L (ref 6–29)
AST: 20 U/L (ref 10–35)
Albumin: 4.2 g/dL (ref 3.6–5.1)
Alkaline Phosphatase: 66 U/L (ref 33–115)
BUN: 9 mg/dL (ref 7–25)
CHLORIDE: 109 mmol/L (ref 98–110)
CO2: 23 mmol/L (ref 20–31)
CREATININE: 0.9 mg/dL (ref 0.50–1.10)
Calcium: 9.2 mg/dL (ref 8.6–10.2)
Glucose, Bld: 80 mg/dL (ref 65–99)
Potassium: 4.2 mmol/L (ref 3.5–5.3)
SODIUM: 140 mmol/L (ref 135–146)
Total Bilirubin: 0.3 mg/dL (ref 0.2–1.2)
Total Protein: 6.9 g/dL (ref 6.1–8.1)

## 2016-06-28 NOTE — Progress Notes (Signed)
    Brandi Cooke 1966-02-25 [id number]        50 y.o.  G1P1001  for annual exam.    Past medical history,surgical history, problem list, medications, allergies, family history and social history were all reviewed and documented as reviewed in the EPIC chart.  ROS:  Performed with pertinent positives and negatives included in the history, assessment and plan.   Additional significant findings :  None   Exam: Brandi Cooke assistant Vitals:   06/28/16 1518  BP: 120/74  Weight: 158 lb (71.7 kg)  Height: 5\' 6"  (1.676 m)   Body mass index is 25.5 kg/m.  General appearance:  Normal affect, orientation and appearance. Skin: Grossly normal HEENT: Without gross lesions.  No cervical or supraclavicular adenopathy. Thyroid normal.  Lungs:  Clear without wheezing, rales or rhonchi Cardiac: RR, without RMG Abdominal:  Soft, nontender, without masses, guarding, rebound, organomegaly or hernia Breasts:  Examined lying and sitting without masses, retractions, discharge or axillary adenopathy. Pelvic:  Ext, BUS, Vagina normal  Cervix normal  Uterus retroverted, generous size midline mobile nontender   Adnexa without masses or tenderness    Anus and perineum normal   Rectovaginal normal sphincter tone without palpated masses or tenderness.    Assessment/Plan:  50 y.o. G66P1001 female for annual exam.   1. Amenorrhea. [place] last year when she was having irregular menses 81. Has not had menses this past year. Having some hot flushes and sweats which she is "dealing with" and not interested in discussing HRT. Will follow for now. If she does any bleeding she knows to call me or her symptoms are worse. 2. History of leiomyoma. We talked about doing a sonohysterogram last year but she never scheduled to follow up for this. As she has done no bleeding and her uterus palpates smaller consistent with her menopausal state we'll follow for now. Does have a history of myomectomy in the  past. 3. Contraception not an issue given her amenorrhea for over a year as well as she is not sexually active. 4. Mammography 04/2016. History of granular cell tumor excision left breast 2015. No special follow up was recommended to the patient. We'll continue with annual mammography when due. SBE monthly reviewed. 5. Pap smear 2016. Pap smear/HPV negative 2013. Patient uncomfortable with less frequent screening and requests Pap smear today. Pap smear done. 6. Health maintenance. Baseline CBC, CMP, lipid profile, urinalysis ordered. Follow up 1 year, sooner as needed.    Anastasio Auerbach MD, 4:00 PM 06/28/2016

## 2016-06-28 NOTE — Addendum Note (Signed)
Addended by: Nelva Nay on: 06/28/2016 04:12 PM   Modules accepted: Orders

## 2016-06-28 NOTE — Patient Instructions (Signed)

## 2016-06-29 LAB — URINALYSIS W MICROSCOPIC + REFLEX CULTURE
BACTERIA UA: NONE SEEN [HPF]
BILIRUBIN URINE: NEGATIVE
Casts: NONE SEEN [LPF]
Crystals: NONE SEEN [HPF]
GLUCOSE, UA: NEGATIVE
Hgb urine dipstick: NEGATIVE
Ketones, ur: NEGATIVE
Leukocytes, UA: NEGATIVE
NITRITE: NEGATIVE
PROTEIN: NEGATIVE
RBC / HPF: NONE SEEN RBC/HPF (ref <=2)
Specific Gravity, Urine: 1.022 (ref 1.001–1.035)
WBC UA: NONE SEEN WBC/HPF (ref <=5)
Yeast: NONE SEEN [HPF]
pH: 5.5 (ref 5.0–8.0)

## 2016-07-02 LAB — PAP IG W/ RFLX HPV ASCU

## 2016-11-19 ENCOUNTER — Emergency Department (HOSPITAL_COMMUNITY)
Admission: EM | Admit: 2016-11-19 | Discharge: 2016-11-19 | Disposition: A | Discharge status: Home / Self Care | Payer: No Typology Code available for payment source | Attending: Emergency Medicine | Admitting: Emergency Medicine

## 2016-11-19 ENCOUNTER — Encounter (HOSPITAL_COMMUNITY): Payer: Self-pay | Admitting: *Deleted

## 2016-11-19 ENCOUNTER — Emergency Department (HOSPITAL_COMMUNITY): Payer: No Typology Code available for payment source

## 2016-11-19 DIAGNOSIS — Y939 Activity, unspecified: Secondary | ICD-10-CM | POA: Diagnosis not present

## 2016-11-19 DIAGNOSIS — M545 Low back pain, unspecified: Secondary | ICD-10-CM

## 2016-11-19 DIAGNOSIS — Z79899 Other long term (current) drug therapy: Secondary | ICD-10-CM | POA: Insufficient documentation

## 2016-11-19 DIAGNOSIS — Y999 Unspecified external cause status: Secondary | ICD-10-CM | POA: Diagnosis not present

## 2016-11-19 DIAGNOSIS — F17210 Nicotine dependence, cigarettes, uncomplicated: Secondary | ICD-10-CM | POA: Diagnosis not present

## 2016-11-19 DIAGNOSIS — Z91040 Latex allergy status: Secondary | ICD-10-CM | POA: Insufficient documentation

## 2016-11-19 DIAGNOSIS — Z8673 Personal history of transient ischemic attack (TIA), and cerebral infarction without residual deficits: Secondary | ICD-10-CM | POA: Insufficient documentation

## 2016-11-19 DIAGNOSIS — Y92410 Unspecified street and highway as the place of occurrence of the external cause: Secondary | ICD-10-CM | POA: Insufficient documentation

## 2016-11-19 LAB — I-STAT BETA HCG BLOOD, ED (MC, WL, AP ONLY)

## 2016-11-19 MED ORDER — ACETAMINOPHEN 500 MG PO TABS: 500.0000 mg | ORAL_TABLET | Freq: Four times a day (QID) | ORAL | 0 refills | Status: DC | PRN

## 2016-11-19 MED ORDER — CYCLOBENZAPRINE HCL 10 MG PO TABS: 10.0000 mg | ORAL_TABLET | Freq: Two times a day (BID) | ORAL | 0 refills | Status: DC | PRN

## 2016-11-19 NOTE — Discharge Instructions (Signed)
Please take Tylenol as needed for pain. Please take Flexeril as needed for muscle pain and muscle spasm. Use warm compresses to the area. Please follow up with a primary care provider within 1-2 weeks if symptoms continue.  Get help right away if: You have: Numbness, tingling, or weakness in your arms or legs. Severe neck pain, especially tenderness in the middle of the back of your neck. Changes in bowel or bladder control. Increasing pain in any area of your body. Shortness of breath or light-headedness. Chest pain. Blood in your urine, stool, or vomit. Severe pain in your abdomen or your back. Severe or worsening headaches. Sudden vision loss or double vision. Your eye suddenly becomes red. Your pupil is an odd shape or size.

## 2016-11-19 NOTE — ED Notes (Signed)
Patient transported to X-ray 

## 2016-11-19 NOTE — ED Notes (Signed)
Pt ambulated to restroom, pt placed back on monitor.
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
## 2016-11-19 NOTE — ED Notes (Signed)
Pt is in stable condition upon d/c and ambulates from ED. 

## 2016-11-19 NOTE — ED Notes (Signed)
Called xray and informed them pt is ready for transport and pregnancy test is negative.

## 2017-05-28 ENCOUNTER — Other Ambulatory Visit: Payer: Self-pay | Admitting: Gynecology

## 2017-05-28 DIAGNOSIS — Z139 Encounter for screening, unspecified: Secondary | ICD-10-CM

## 2017-06-19 ENCOUNTER — Ambulatory Visit
Admission: RE | Admit: 2017-06-19 | Discharge: 2017-06-19 | Disposition: A | Discharge status: Home / Self Care | Payer: Medicaid Other | Source: Ambulatory Visit | Attending: Gynecology | Admitting: Gynecology

## 2017-06-19 DIAGNOSIS — Z139 Encounter for screening, unspecified: Secondary | ICD-10-CM

## 2017-07-01 ENCOUNTER — Ambulatory Visit (INDEPENDENT_AMBULATORY_CARE_PROVIDER_SITE_OTHER): Payer: Medicaid Other | Admitting: Gynecology

## 2017-07-01 ENCOUNTER — Encounter: Payer: Self-pay | Admitting: Gynecology

## 2017-07-01 VITALS — BP 122/80 | Ht 65.0 in | Wt 152.0 lb

## 2017-07-01 DIAGNOSIS — Z01419 Encounter for gynecological examination (general) (routine) without abnormal findings: Secondary | ICD-10-CM

## 2017-07-01 NOTE — Progress Notes (Signed)
    Brandi Cooke [birth year] [id number]        51 y.o.  G1P1001 for annual gynecologic exam.  Without gynecologic complaints.  Past medical history,surgical history, problem list, medications, allergies, family history and social history were all reviewed and documented as reviewed in the EPIC chart.  ROS:  Performed with pertinent positives and negatives included in the history, assessment and plan.   Additional significant findings : None   Exam: Brandi Cooke assistant Vitals:   07/01/17 1518  BP: 122/80  Weight: 152 lb (68.9 kg)  Height: 5\' 5"  (1.651 m)   Body mass index is 25.29 kg/m.  General appearance:  Normal affect, orientation and appearance. Skin: Grossly normal HEENT: Without gross lesions.  No cervical or supraclavicular adenopathy. Thyroid normal.  Lungs:  Clear without wheezing, rales or rhonchi Cardiac: RR, without RMG Abdominal:  Soft, nontender, without masses, guarding, rebound, organomegaly or hernia Breasts:  Examined lying and sitting without masses, retractions, discharge or axillary adenopathy. Pelvic:  Ext, BUS, Vagina: Normal  Cervix: Normal  Uterus: Retroverted, normal size, shape and contour, midline and mobile nontender   Adnexa: Without masses or tenderness    Anus and perineum: Normal   Rectovaginal: Normal sphincter tone without palpated masses or tenderness.    Assessment/Plan:  51 y.o. G28P1001 female for annual gynecologic exam without menses, not sexually active.   1. Without menses.  [place] elevated in the past.  Doing well without significant hot flushes, night sweats or vaginal dryness.  No vaginal bleeding.  Continue to monitor and report any issues or bleeding. 2. Mammography 05/2017.  Continue with annual mammography when due.  SBE monthly reviewed.  Breast exam normal today. 3. Pap smear 05/2016.  Pap smear/HPV negative 2013.  No history of abnormal Pap smears previously.  No Pap smear done today.  Plan repeat Pap smear 3-year  interval per current screening guidelines. 4. Health maintenance.  No routine lab work done today as patient is going to establish care with a primary physician and then will follow-up with them for routine lab work.  Follow-up in 1 year for annual gynecologic exam, sooner if any issues.   Brandi Auerbach MD, 3:44 PM 07/01/2017

## 2017-07-01 NOTE — Patient Instructions (Signed)
Follow-up in 1 year for annual exam, sooner as needed. 

## 2018-06-02 ENCOUNTER — Other Ambulatory Visit: Payer: Self-pay | Admitting: Gynecology

## 2018-06-02 DIAGNOSIS — Z1231 Encounter for screening mammogram for malignant neoplasm of breast: Secondary | ICD-10-CM

## 2018-06-22 ENCOUNTER — Encounter (HOSPITAL_COMMUNITY): Payer: Self-pay | Admitting: *Deleted

## 2018-06-22 ENCOUNTER — Other Ambulatory Visit: Payer: Self-pay

## 2018-06-22 ENCOUNTER — Emergency Department (HOSPITAL_COMMUNITY): Payer: Medicaid Other

## 2018-06-22 ENCOUNTER — Emergency Department (HOSPITAL_COMMUNITY)
Admission: EM | Admit: 2018-06-22 | Discharge: 2018-06-22 | Disposition: A | Discharge status: Home / Self Care | Payer: Medicaid Other | Attending: Emergency Medicine | Admitting: Emergency Medicine

## 2018-06-22 DIAGNOSIS — F17210 Nicotine dependence, cigarettes, uncomplicated: Secondary | ICD-10-CM | POA: Insufficient documentation

## 2018-06-22 DIAGNOSIS — I10 Essential (primary) hypertension: Secondary | ICD-10-CM | POA: Insufficient documentation

## 2018-06-22 DIAGNOSIS — R109 Unspecified abdominal pain: Secondary | ICD-10-CM | POA: Insufficient documentation

## 2018-06-22 LAB — URINALYSIS, ROUTINE W REFLEX MICROSCOPIC
Bilirubin Urine: NEGATIVE
GLUCOSE, UA: NEGATIVE mg/dL
HGB URINE DIPSTICK: NEGATIVE
Ketones, ur: NEGATIVE mg/dL
Leukocytes, UA: NEGATIVE
Nitrite: NEGATIVE
PH: 5 (ref 5.0–8.0)
PROTEIN: NEGATIVE mg/dL
Specific Gravity, Urine: 1.024 (ref 1.005–1.030)

## 2018-06-22 MED ORDER — METHOCARBAMOL 500 MG PO TABS: 500.0000 mg | ORAL_TABLET | Freq: Two times a day (BID) | ORAL | 0 refills | Status: DC

## 2018-06-22 MED ORDER — KETOROLAC TROMETHAMINE 60 MG/2ML IM SOLN
30.0000 mg | Freq: Once | INTRAMUSCULAR | Status: AC
  Administered 2018-06-22: 30 mg via INTRAMUSCULAR
  Filled 2018-06-22: qty 2

## 2018-06-22 MED ORDER — DIAZEPAM 5 MG PO TABS: 5.0000 mg | ORAL_TABLET | Freq: Once | ORAL | Status: DC

## 2018-06-22 MED ORDER — LISINOPRIL 10 MG PO TABS: 10.0000 mg | ORAL_TABLET | Freq: Every day | ORAL | 0 refills | Status: DC

## 2018-06-22 NOTE — ED Provider Notes (Signed)
[facility] DEPT Provider Note   CSN: [id number] Arrival date & time: 06/22/18  1236     History   Chief Complaint Chief Complaint  Patient presents with  . Flank Pain    HPI ARIANIE COUSE is a 52 y.o. female.  52 year old female presented complaining of left back pain characterizes dull and worse with any movement.  Denies any dyspnea.  No cough or congestion.  She has dark urine but denies any dysuria or hematuria.  Patient works doing Retail buyer.  No injury.  Pain better with remaining still.  No treatment used prior to arrival     Past Medical History:  Diagnosis Date  . Allergy   . Anemia   . Bell's palsy   . Fibroid   . Glaucoma    Open  . Stroke [facility])     Patient Active Problem List   Diagnosis Date Noted  . Granular cell tumor 12/07/2013  . Fibroid   . Bell's palsy   . Glaucoma     Past Surgical History:  Procedure Laterality Date  . BREAST EXCISIONAL BIOPSY Left   . BREAST LUMPECTOMY WITH RADIOACTIVE SEED LOCALIZATION Left 01/12/2014   Procedure: LEFT BREAST LUMPECTOMY WITH RADIOACTIVE SEED LOCALIZATION;  Surgeon: Joyice Faster. Cornett, MD;  Location: [facility];  Service: General;  Laterality: Left;  . BREAST SURGERY     Cyst removed  . HYSTEROSCOPY  2002   myomectomy  . MYOMECTOMY  10/2005  . PELVIC LAPAROSCOPY     Expl. Lap.  Marland Kitchen REFRACTIVE SURGERY       OB History    Gravida  1   Para  1   Term  1   Preterm      AB      Living  1     SAB      TAB      Ectopic      Multiple      Live Births               Home Medications    Prior to Admission medications   Medication Sig Start Date End Date Taking? Authorizing Provider  ibuprofen (ADVIL,MOTRIN) 200 MG tablet Take 400 mg by mouth every 6 (six) hours as needed for headache or moderate pain.   Yes [provider]    Family History Family History  Problem Relation Age of Onset  . Hypertension Mother   .  Diabetes Mother   . Stroke Brother   . Migraines Brother   . Cancer Maternal Uncle        Unknown type  . Migraines Brother   . Heart disease Brother   . Thyroid disease Brother     Social History Social History   Tobacco Use  . Smoking status: Current Every Day Smoker    Packs/day: 0.50    Types: Cigarettes  . Smokeless tobacco: Never Used  Substance Use Topics  . Alcohol use: Yes    Alcohol/week: 3.0 standard drinks    Types: 3 Standard drinks or equivalent per week  . Drug use: No     Allergies   Latex   Review of Systems Review of Systems  All other systems reviewed and are negative.    Physical Exam Updated Vital Signs BP 121/83 (BP Location: Left Arm)   Pulse 71   Temp 98.2 F (36.8 C) (Oral)   Resp 18   Ht 1.702 m (5\' 7" )   Wt 68 kg  LMP 10/06/2014   SpO2 99%   BMI 23.49 kg/m   Physical Exam  Constitutional: She is oriented to person, place, and time. She appears well-developed and well-nourished.  Non-toxic appearance. No distress.  HENT:  Head: Normocephalic and atraumatic.  Eyes: Pupils are equal, round, and reactive to light. Conjunctivae, EOM and lids are normal.  Neck: Normal range of motion. Neck supple. No tracheal deviation present. No thyroid mass present.  Cardiovascular: Normal rate, regular rhythm and normal heart sounds. Exam reveals no gallop.  No murmur heard. Pulmonary/Chest: Effort normal and breath sounds normal. No stridor. No respiratory distress. She has no decreased breath sounds. She has no wheezes. She has no rhonchi. She has no rales.  Abdominal: Soft. Normal appearance and bowel sounds are normal. She exhibits no distension. There is no tenderness. There is no rebound and no CVA tenderness.  Musculoskeletal: Normal range of motion. She exhibits no edema.       Thoracic back: She exhibits tenderness and pain.       Back:  Neurological: She is alert and oriented to person, place, and time. She has normal strength. No  cranial nerve deficit or sensory deficit. GCS eye subscore is 4. GCS verbal subscore is 5. GCS motor subscore is 6.  Skin: Skin is warm and dry. No abrasion and no rash noted.  Psychiatric: She has a normal mood and affect. Her speech is normal and behavior is normal.  Nursing note and vitals reviewed.    ED Treatments / Results  Labs (all labs ordered are listed, but only abnormal results are displayed) Labs Reviewed  URINALYSIS, ROUTINE W REFLEX MICROSCOPIC    EKG None  Radiology Dg Ribs Unilateral W/chest Left  Result Date: 06/22/2018 CLINICAL DATA:  LEFT side flank pain radiating to LEFT upper abdomen for 2 days, worse with bending over EXAM: LEFT RIBS AND CHEST - 3+ VIEW COMPARISON:  None FINDINGS: Normal heart size, mediastinal contours, and pulmonary vascularity. Lungs clear. No pleural effusion or pneumothorax. Minimal biconvex thoracolumbar scoliosis. Osseous mineralization normal. 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 No rib fracture or bone destruction. IMPRESSION: No acute abnormalities. Electronically Signed   By: Lavonia Dana M.D.   On: 06/22/2018 14:24    Procedures Procedures (including critical care time)  Medications Ordered in ED Medications  diazepam (VALIUM) tablet 5 mg (5 mg Oral Not Given 06/22/18 1406)  ketorolac (TORADOL) injection 30 mg (30 mg Intramuscular Given 06/22/18 1429)     Initial Impression / Assessment and Plan / ED Course  I have reviewed the triage vital signs and the nursing notes.  Pertinent labs & imaging results that were available during my care of the patient were reviewed by me and considered in my medical decision making (see chart for details).     Chest x-ray negative for any acute process.  Urinalysis without infection other.  Musculoskeletal strain and was treated with Toradol and feels better.  Patient requesting refill for her lisinopril.  We will also refer to primary care  Final Clinical Impressions(s) / ED Diagnoses    Final diagnoses:  None    ED Discharge Orders    None       Lacretia Leigh, MD 06/22/18 1455

## 2018-06-22 NOTE — ED Triage Notes (Addendum)
Pt complains of left sided flank pain radiating up left upper abdomen x 2 days. Pt denies urinary symptoms. Pain is worse when bending over.

## 2018-07-02 ENCOUNTER — Ambulatory Visit (INDEPENDENT_AMBULATORY_CARE_PROVIDER_SITE_OTHER): Payer: Medicaid Other | Admitting: Gynecology

## 2018-07-02 ENCOUNTER — Encounter: Payer: Self-pay | Admitting: Gynecology

## 2018-07-02 VITALS — BP 124/84 | Ht 65.5 in | Wt 160.0 lb

## 2018-07-02 DIAGNOSIS — Z01419 Encounter for gynecological examination (general) (routine) without abnormal findings: Secondary | ICD-10-CM

## 2018-07-02 NOTE — Progress Notes (Signed)
    SHANNEL ZAHM 23-Oct-1965 [id number]        52 y.o.  G1P1001 for annual gynecologic exam.  Without gynecologic complaints  Past medical history,surgical history, problem list, medications, allergies, family history and social history were all reviewed and documented as reviewed in the EPIC chart.  ROS:  Performed with pertinent positives and negatives included in the history, assessment and plan.   Additional significant findings : None   Exam: [name] assistant Vitals:   07/02/18 1544  BP: 124/84  Weight: 160 lb (72.6 kg)  Height: 5' 5.5" (1.664 m)   Body mass index is 26.22 kg/m.  General appearance:  Normal affect, orientation and appearance. Skin: Grossly normal HEENT: Without gross lesions.  No cervical or supraclavicular adenopathy. Thyroid normal.  Lungs:  Clear without wheezing, rales or rhonchi Cardiac: RR, without RMG Abdominal:  Soft, nontender, without masses, guarding, rebound, organomegaly or hernia Breasts:  Examined lying and sitting without masses, retractions, discharge or axillary adenopathy. Pelvic:  Ext, BUS, Vagina: Normal  Cervix: Normal  Uterus: Axial, normal size, shape and contour, midline and mobile nontender   Adnexa: Without masses or tenderness    Anus and perineum: Normal   Rectovaginal: Normal sphincter tone without palpated masses or tenderness.    Assessment/Plan:  52 y.o. G35P1001 female for annual gynecologic exam.   1. Postmenopausal.  No significant hot flashes or sweats.  No vaginal bleeding.  History of elevated FSH in the past.  Continue to monitor and report any symptoms or bleeding. 2. Mammography due and I reminded patient to schedule this.  Breast exam normal today. 3. Colonoscopy never.  Recommended screening colonoscopy as she has turned 52. 4. Pap smear 05/2016.  No Pap smear done today.  No history of abnormal Pap smears.  Plan repeat Pap smear next year at 3-year interval per current screening  guidelines. 5. Health maintenance.  No routine lab work done as patient reports she is planning to follow-up with her primary physician.  Return for annual gynecologic exam in 1 year, sooner if any issues.   Anastasio Auerbach MD, 4:07 PM 07/02/2018

## 2018-07-02 NOTE — Patient Instructions (Signed)
Schedule in follow-up for your mammogram as you are now due.  Schedule a screening colonoscopy that is recommended for all patients when they turned 52.  [facility]   Address: [address], [place], [place] [address]  [phone]    or  [facility]  Address: [address], [place], [place] [address]  [phone]

## 2018-07-11 ENCOUNTER — Ambulatory Visit: Payer: Medicaid Other

## 2018-07-11 ENCOUNTER — Ambulatory Visit
Admission: RE | Admit: 2018-07-11 | Discharge: 2018-07-11 | Disposition: A | Discharge status: Home / Self Care | Payer: Medicaid Other | Source: Ambulatory Visit | Attending: Gynecology | Admitting: Gynecology

## 2018-07-11 DIAGNOSIS — Z1231 Encounter for screening mammogram for malignant neoplasm of breast: Secondary | ICD-10-CM

## 2019-04-22 ENCOUNTER — Encounter: Payer: Self-pay | Admitting: Gynecology

## 2019-05-04 ENCOUNTER — Other Ambulatory Visit: Payer: Self-pay | Admitting: Gynecology

## 2019-05-30 IMAGING — MG DIGITAL SCREENING BILATERAL MAMMOGRAM WITH TOMO AND CAD
8 series · 8 of 24 positions shown · non-contrast
Comparison: Previous exam(s).

CLINICAL DATA: Screening.

EXAM:
DIGITAL SCREENING BILATERAL MAMMOGRAM WITH TOMO AND CAD

[L MLO synth-2D]
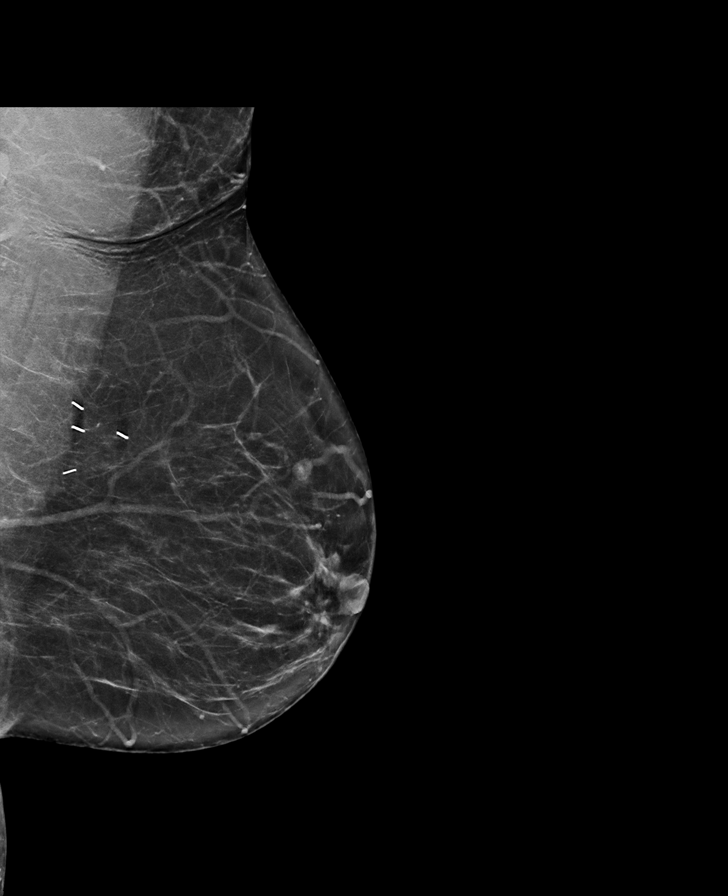

[L CC synth-2D]
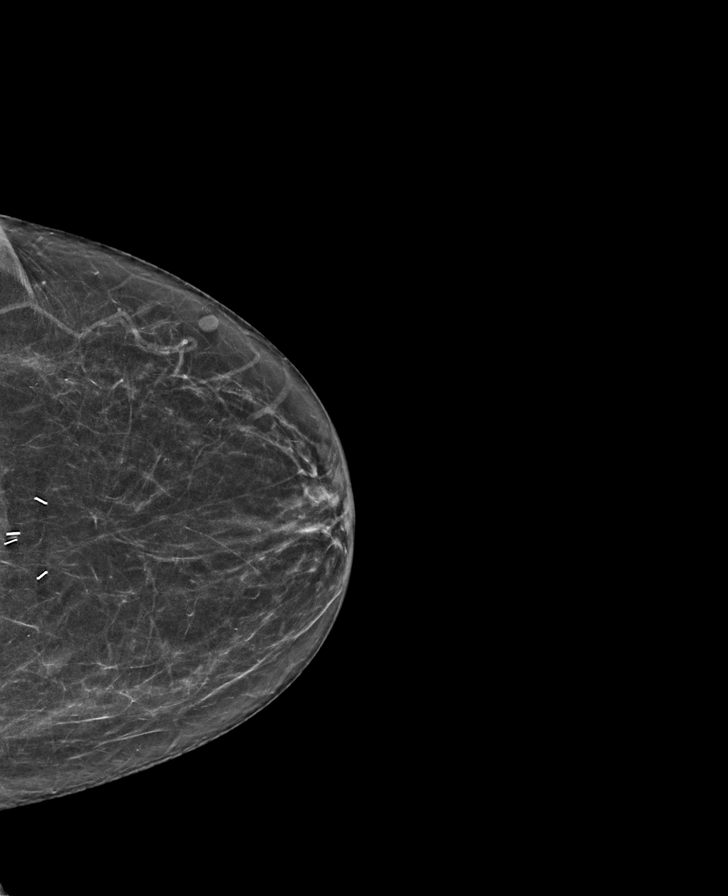

[R CC synth-2D]
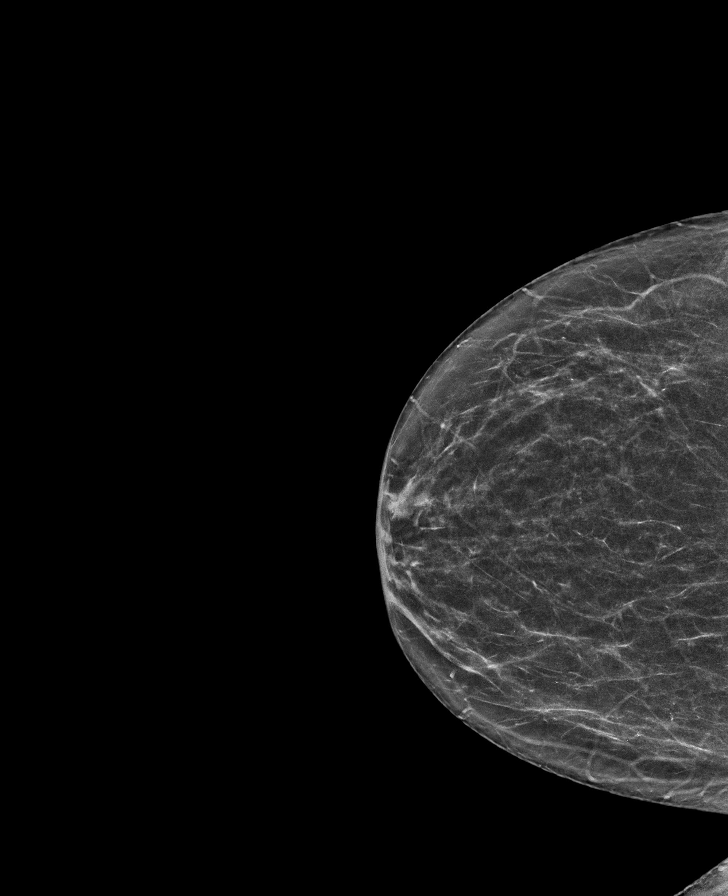

[R MLO synth-2D]
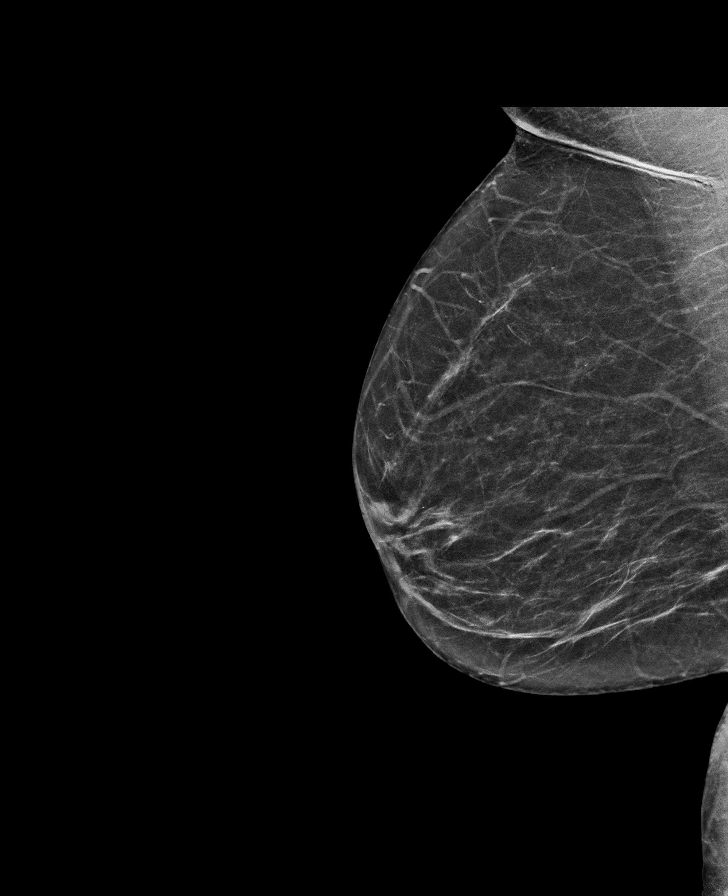

[L MLO tomo · tomo slice 37/73.0]
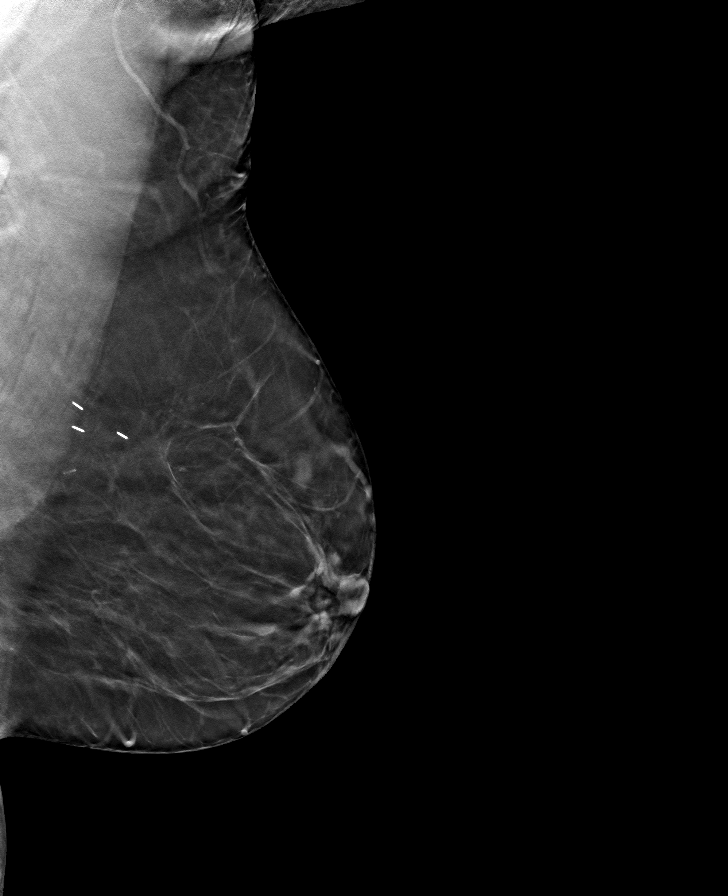

[R CC tomo · tomo slice 31/60.0]
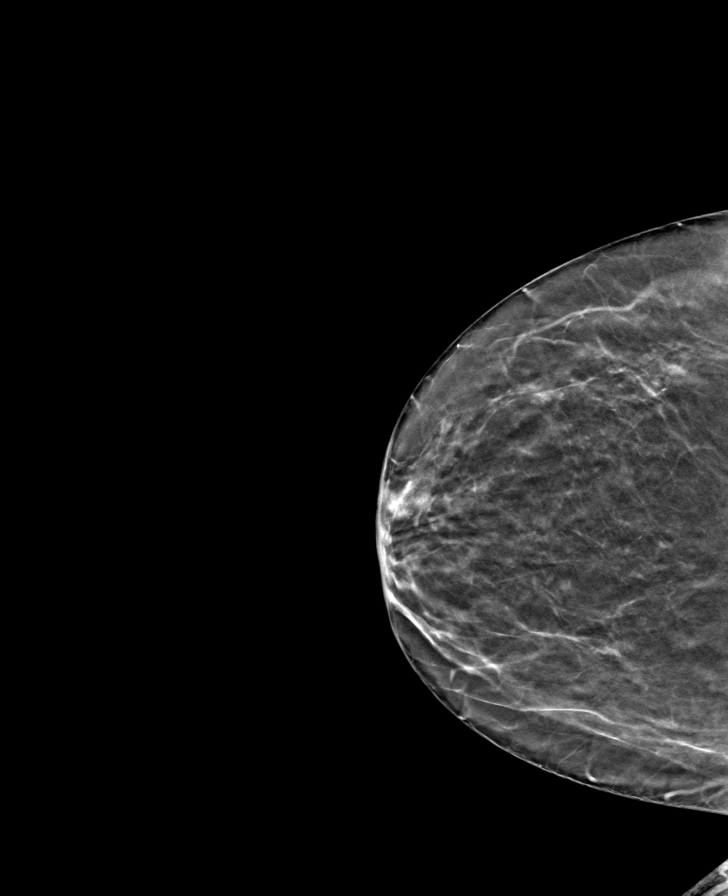

[L CC tomo · tomo slice 31/60.0]
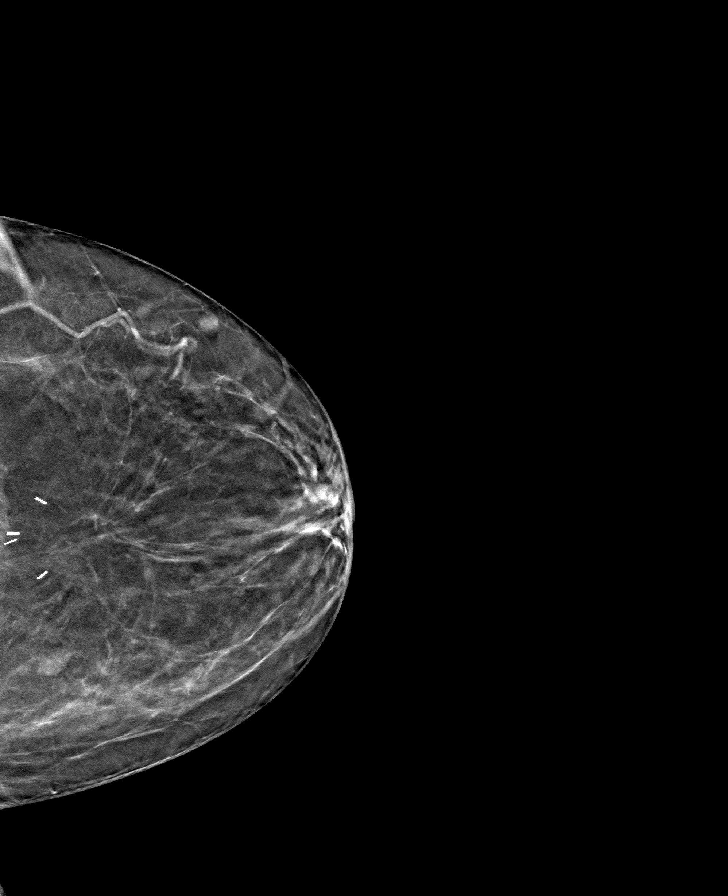

[R MLO tomo · tomo slice 33/65.0]
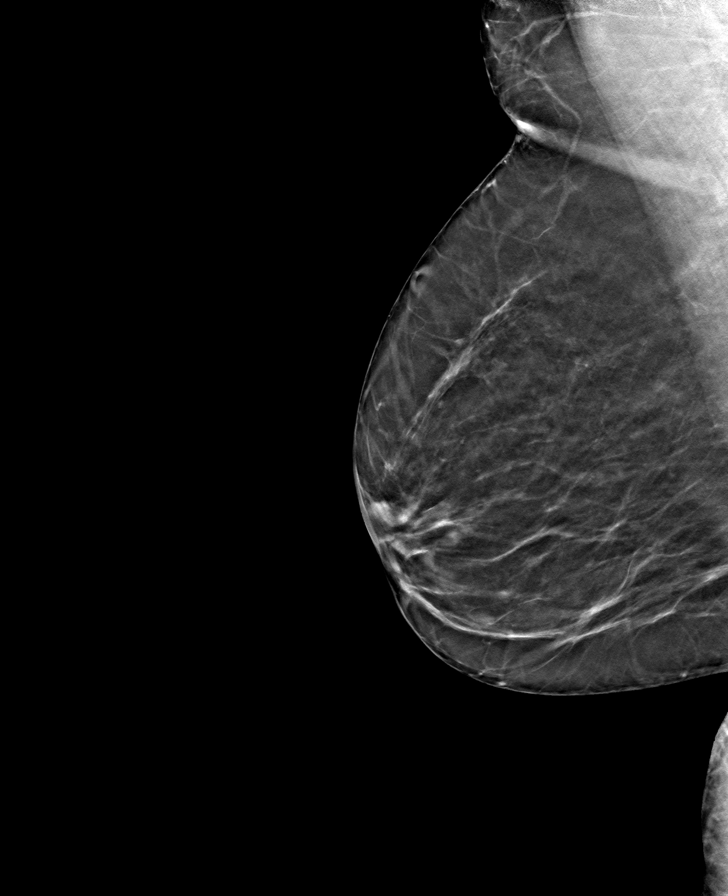

[8 of 24 positions shown; findings below may reference images not displayed]

ACR Breast Density Category b: There are scattered areas of
fibroglandular density.
FINDINGS: There are no findings suspicious for malignancy. Images were
processed with CAD.
IMPRESSION: No mammographic evidence of malignancy. A result letter of this
screening mammogram will be mailed directly to the patient.

RECOMMENDATION:
Screening mammogram in one year. (Code:[id number])

BI-RADS CATEGORY  1: Negative.

## 2019-06-04 ENCOUNTER — Other Ambulatory Visit: Payer: Self-pay | Admitting: Gynecology

## 2019-06-04 DIAGNOSIS — Z1231 Encounter for screening mammogram for malignant neoplasm of breast: Secondary | ICD-10-CM

## 2019-07-06 ENCOUNTER — Ambulatory Visit (INDEPENDENT_AMBULATORY_CARE_PROVIDER_SITE_OTHER): Payer: Self-pay | Admitting: Gynecology

## 2019-07-06 ENCOUNTER — Other Ambulatory Visit: Payer: Self-pay

## 2019-07-06 ENCOUNTER — Encounter: Payer: Self-pay | Admitting: Gynecology

## 2019-07-06 VITALS — BP 124/76 | Ht 65.0 in | Wt 165.0 lb

## 2019-07-06 DIAGNOSIS — Z01419 Encounter for gynecological examination (general) (routine) without abnormal findings: Secondary | ICD-10-CM

## 2019-07-06 NOTE — Addendum Note (Signed)
Addended by: Nelva Nay on: 07/06/2019 04:18 PM   Modules accepted: Orders

## 2019-07-06 NOTE — Progress Notes (Signed)
    Brandi Cooke 17-May-1966 [id number]        53 y.o.  G1P1001 for annual gynecologic exam.  Without gynecologic complaints  Past medical history,surgical history, problem list, medications, allergies, family history and social history were all reviewed and documented as reviewed in the EPIC chart.  ROS:  Performed with pertinent positives and negatives included in the history, assessment and plan.   Additional significant findings : None   Exam: [name] assistant Vitals:   07/06/19 1533  BP: 124/76  Weight: 165 lb (74.8 kg)  Height: 5\' 5"  (1.651 m)   Body mass index is 27.46 kg/m.  General appearance:  Normal affect, orientation and appearance. Skin: Grossly normal HEENT: Without gross lesions.  No cervical or supraclavicular adenopathy. Thyroid normal.  Lungs:  Clear without wheezing, rales or rhonchi Cardiac: RR, without RMG Abdominal:  Soft, nontender, without masses, guarding, rebound, organomegaly or hernia Breasts:  Examined lying and sitting without masses, retractions, discharge or axillary adenopathy. Pelvic:  Ext, BUS, Vagina: Normal  Cervix: Normal  Uterus: Axial, normal size, shape and contour, midline and mobile nontender   Adnexa: Without masses or tenderness    Anus and perineum: Normal   Rectovaginal: Normal sphincter tone without palpated masses or tenderness.    Assessment/Plan:  53 y.o. G28P1001 female for annual gynecologic exam.   1. Postmenopausal.  No significant menopausal symptoms or any vaginal bleeding. 2. Mammography due now and patient has scheduled.  Breast exam normal today. 3. Colonoscopy never.  Recommended patient schedule colonoscopy this coming year and she agrees to do so.  Names given. 4. Pap smear 2017.  Pap smear done today.  No history of abnormal Pap smears. 5. Health maintenance.  No routine lab work done as patient plans to do elsewhere.  Follow-up in 1 year, sooner as needed.   Anastasio Auerbach MD, 4:04 PM  07/06/2019

## 2019-07-06 NOTE — Patient Instructions (Addendum)
Follow-up in 1 year for annual exam, sooner as needed.  Schedule your colonoscopy with:  [facility]   Address: [address], [place], [place] [address]  [phone]

## 2019-07-07 LAB — PAP IG W/ RFLX HPV ASCU

## 2019-07-20 ENCOUNTER — Ambulatory Visit
Admission: RE | Admit: 2019-07-20 | Discharge: 2019-07-20 | Disposition: A | Discharge status: Home / Self Care | Payer: Medicaid Other | Source: Ambulatory Visit | Attending: Gynecology | Admitting: Gynecology

## 2019-07-20 ENCOUNTER — Other Ambulatory Visit: Payer: Self-pay

## 2019-07-20 DIAGNOSIS — Z1231 Encounter for screening mammogram for malignant neoplasm of breast: Secondary | ICD-10-CM

## 2019-07-22 ENCOUNTER — Ambulatory Visit: Payer: Medicaid Other

## 2020-07-06 ENCOUNTER — Encounter: Payer: Medicaid Other | Admitting: Obstetrics and Gynecology

## 2020-07-11 ENCOUNTER — Encounter: Payer: Self-pay | Admitting: Obstetrics and Gynecology

## 2020-07-11 ENCOUNTER — Other Ambulatory Visit: Payer: Self-pay | Admitting: Obstetrics and Gynecology

## 2020-07-11 ENCOUNTER — Ambulatory Visit (INDEPENDENT_AMBULATORY_CARE_PROVIDER_SITE_OTHER): Payer: Medicaid Other | Admitting: Obstetrics and Gynecology

## 2020-07-11 ENCOUNTER — Other Ambulatory Visit: Payer: Self-pay

## 2020-07-11 VITALS — BP 124/82 | Ht 65.0 in | Wt 152.0 lb

## 2020-07-11 DIAGNOSIS — Z01419 Encounter for gynecological examination (general) (routine) without abnormal findings: Secondary | ICD-10-CM

## 2020-07-11 DIAGNOSIS — Z1231 Encounter for screening mammogram for malignant neoplasm of breast: Secondary | ICD-10-CM

## 2020-07-11 NOTE — Progress Notes (Signed)
   Brandi Cooke [birth year] [id number]  SUBJECTIVE:  54 y.o. G1P1001 female for annual routine gynecologic exam. She has no gynecologic concerns.  Current Outpatient Medications  Medication Sig Dispense Refill  . ibuprofen (ADVIL,MOTRIN) 200 MG tablet Take 400 mg by mouth every 6 (six) hours as needed for headache or moderate pain.    Marland Kitchen lisinopril (PRINIVIL,ZESTRIL) 10 MG tablet Take 1 tablet (10 mg total) by mouth daily. (Patient not taking: No sig reported) 60 tablet 0   No current facility-administered medications for this visit.   Allergies: Latex  Patient's last menstrual period was 10/06/2014.  Past medical history,surgical history, problem list, medications, allergies, family history and social history were all reviewed and documented as reviewed in the EPIC chart.  ROS: Pertinent positives and negatives as reviewed in HPI    OBJECTIVE:  BP 124/82   Ht 5\' 5"  (1.651 m)   Wt 152 lb (68.9 kg)   LMP 10/06/2014   BMI 25.29 kg/m  The patient appears well, alert, oriented, in no distress. Neck supple. No cervical or supraclavicular adenopathy or thyromegaly.  Lungs are clear, good air entry, no wheezes, rhonchi or rales. S1 and S2 normal, no murmurs, regular rate and rhythm.  Abdomen soft without tenderness, guarding, mass or organomegaly.  Neurological is normal, no focal findings.  BREAST EXAM: breasts appear normal, no suspicious masses, no skin or nipple changes or axillary nodes  PELVIC EXAM: VULVA: normal appearing vulva with atrophic changes, no masses, tenderness or lesions, VAGINA: normal appearing vagina with atrophic changes, normal color and discharge, no lesions, CERVIX: normal appearing cervix without discharge or lesions, UTERUS: uterus is normal size, shape, consistency and nontender, ADNEXA: normal adnexa in size, nontender and no masses  Chaperone: [name] present during the examination  ASSESSMENT:  54 y.o. G1P1001 here for annual gynecologic  exam  PLAN:   1. Postmenopausal.  Minor vasomotor symptoms but does not desire treatment at this time.  Will let us know if symptoms get any worse.  No vaginal bleeding. 2. Pap smear 2020.  No significant history of abnormal Pap smears.  Next Pap smear due 2023 following the current guidelines recommending the 3 year interval. 3. Mammogram 06/2019.  Normal breast exam today.  Mammogram is due now and she is reminded to schedule she indicates she plans to do this. 4. Colonoscopy never.  Discussed the importance of colon cancer screening, early detection is best afforded by screening and results in the best outcomes.  She knowledges my recommendations and will plan to get a colonoscopy scheduled. 5. DEXA never.  Recommend when further into menopause. 6. Health maintenance.  No labs today as she normally has these completed elsewhere  Return annually or sooner, prn.  Joseph Pierini MD 07/11/20

## 2020-08-15 ENCOUNTER — Other Ambulatory Visit: Payer: Self-pay

## 2020-08-15 ENCOUNTER — Encounter: Payer: Self-pay | Admitting: Family Medicine

## 2020-08-15 ENCOUNTER — Telehealth (INDEPENDENT_AMBULATORY_CARE_PROVIDER_SITE_OTHER): Payer: 59 | Admitting: Family Medicine

## 2020-08-15 VITALS — Ht 67.0 in | Wt 150.0 lb

## 2020-08-15 DIAGNOSIS — Z114 Encounter for screening for human immunodeficiency virus [HIV]: Secondary | ICD-10-CM | POA: Diagnosis not present

## 2020-08-15 DIAGNOSIS — Z1159 Encounter for screening for other viral diseases: Secondary | ICD-10-CM | POA: Diagnosis not present

## 2020-08-15 DIAGNOSIS — Z8679 Personal history of other diseases of the circulatory system: Secondary | ICD-10-CM

## 2020-08-15 DIAGNOSIS — Z1211 Encounter for screening for malignant neoplasm of colon: Secondary | ICD-10-CM

## 2020-08-15 DIAGNOSIS — E781 Pure hyperglyceridemia: Secondary | ICD-10-CM | POA: Diagnosis not present

## 2020-08-15 NOTE — Progress Notes (Signed)
Virtual Visit via Telephone Note  I connected with [name] on 08/15/20 at 3:07 PM by telephone and verified that I am speaking with the correct person using two identifiers. Patient location: home  My location: home   I discussed the limitations, risks, security and privacy concerns of performing an evaluation and management service by telephone and the availability of in person appointments. I also discussed with the patient that there may be a patient responsible charge related to this service. The patient expressed understanding and agreed to proceed, consent obtained  Chief complaint: Chief Complaint  Patient presents with  . Transitions Of Care    Pt reports she feel well, but has been off her BP medication for the past year. Pt reports she checks her BP at home and BP has been running below 140/90. Pt reports no physical symptoms of hypertension.    History of Present Illness: Brandi Cooke is a 55 y.o. female  Transition of care visit.  Previous primary care provider listed as Dr. Linna Darner few years ago.  OB/GYN previously with Dr. Phineas Real, now with Dr. Delilah Shan.   Works at [employer], and Health and safety inspector.   History of Bell's palsy, fibroids, glaucoma (on 2 gtts for eyes Lumigan qhs, unknown names, [place]), Granular cell tumor, hypertension. Has been off lisinopril for the past year.  Home readings - rare checks, but around 140/90, or a little below, sometimes higher. Higher with salt intake.  No change in diet/exercise.   BP Readings from Last 3 Encounters:  07/11/20 124/82  07/06/19 124/76  07/02/18 124/84    Lab Results  Component Value Date   CREATININE 0.90 06/28/2016   Lab Results  Component Value Date   CHOL 178 06/28/2016   HDL 47 (L) 06/28/2016   LDLCALC 91 06/28/2016   TRIG 201 (H) 06/28/2016   CHOLHDL 3.8 06/28/2016   Health Maintenance: Colon CA screening: no FH of colon CA. Screening options with colonoscopy versus Cologuard  discussed. Discussed timing of repeat testing intervals if normal, as well as potential need for diagnostic Colonoscopy if positive Cologuard. Understanding expressed, and chose colonoscopy.    There is no immunization history on file for this patient. Had covid vaccine with [employer] - primary series and booster vaccine.  Flu vaccine - declines.  HIV/Hep C testing - negative HIV testing? Requests repeat, prior with OBGYN.      Patient Active Problem List   Diagnosis Date Noted  . Granular cell tumor 12/07/2013  . Fibroid   . Bell's palsy   . Glaucoma    Past Medical History:  Diagnosis Date  . Allergy   . Anemia   . Bell's palsy   . Fibroid   . Glaucoma    Open  . Hypertension   . Stroke [facility])    Past Surgical History:  Procedure Laterality Date  . BREAST EXCISIONAL BIOPSY Left   . BREAST LUMPECTOMY WITH RADIOACTIVE SEED LOCALIZATION Left 01/12/2014   Procedure: LEFT BREAST LUMPECTOMY WITH RADIOACTIVE SEED LOCALIZATION;  Surgeon: Joyice Faster. Cornett, MD;  Location: [facility];  Service: General;  Laterality: Left;  . BREAST SURGERY     Cyst removed  . HYSTEROSCOPY  2002   myomectomy  . MYOMECTOMY  10/2005  . PELVIC LAPAROSCOPY     Expl. Lap.  Marland Kitchen REFRACTIVE SURGERY     Allergies  Allergen Reactions  . Latex Itching and Rash   Prior to Admission medications   Medication Sig Start Date End Date Taking?  Authorizing Provider  ibuprofen (ADVIL,MOTRIN) 200 MG tablet Take 400 mg by mouth every 6 (six) hours as needed for headache or moderate pain.   Yes [provider]  lisinopril (PRINIVIL,ZESTRIL) 10 MG tablet Take 1 tablet (10 mg total) by mouth daily. Patient not taking: No sig reported 06/22/18   Lacretia Leigh, MD   Social History   Socioeconomic History  . Marital status: Single    Spouse name: Not on file  . Number of children: Not on file  . Years of education: Not on file  . Highest education level: Not on file  Occupational History  .  Not on file  Tobacco Use  . Smoking status: Current Every Day Smoker    Packs/day: 0.50    Types: Cigarettes  . Smokeless tobacco: Never Used  Vaping Use  . Vaping Use: Never used  Substance and Sexual Activity  . Alcohol use: Yes    Alcohol/week: 3.0 standard drinks    Types: 3 Standard drinks or equivalent per week  . Drug use: No  . Sexual activity: Not Currently    Comment: 1st intercourse 55 yo-Fewer than 5 partners  Other Topics Concern  . Not on file  Social History Narrative  . Not on file   Social Determinants of Health   Financial Resource Strain: Not on file  Food Insecurity: Not on file  Transportation Needs: Not on file  Physical Activity: Not on file  Stress: Not on file  Social Connections: Not on file  Intimate Partner Violence: Not on file     Observations/Objective: Vitals:   08/15/20 1329  Weight: 150 lb (68 kg)  Height: 5\' 7"  (1.702 m)  No distress on phone, appropriate responses, all questions were answered with understanding of plan expressed.   Assessment and Plan: History of hypertension - Plan: Comprehensive metabolic panel  -Stable reading off medication in December at OB/GYN as well as based on her home monitoring, with few borderline elevated readings.  Home monitoring discussed and if over 130/80 return in office sooner than planned 14-month follow-up.  Lab only visit planned.  Hypertriglyceridemia - Plan: Lipid panel  -Check lipid panel at upcoming lab only visit  Encounter for hepatitis C screening test for low risk patient - Plan: Hepatitis C antibody  Screening for HIV (human immunodeficiency virus) - Plan: HIV Antibody (routine testing w rflx)  Screening for colon cancer Options discussed, chose colonoscopy, referral placed.  Follow Up Instructions: Lab visit next few weeks, in office in 3 months   I discussed the assessment and treatment plan with the patient. The patient was provided an opportunity to ask questions and all  were answered. The patient agreed with the plan and demonstrated an understanding of the instructions.   The patient was advised to call back or seek an in-person evaluation if the symptoms worsen or if the condition fails to improve as anticipated.  I provided 22 minutes of non-face-to-face time during this encounter.  Signed,   Merri Ray, MD [facility].  08/15/20

## 2020-08-15 NOTE — Patient Instructions (Signed)
° ° ° °  If you have lab work done today you will be contacted with your lab results within the next 2 weeks.  If you have not heard from us then please contact us. The fastest way to get your results is to register for My Chart. ° ° °IF you received an x-ray today, you will receive an invoice from [facility]. Please contact [facility] at [phone] with questions or concerns regarding your invoice.  ° °IF you received labwork today, you will receive an invoice from [facility]. Please contact [facility] at [phone] with questions or concerns regarding your invoice.  ° °Our billing staff will not be able to assist you with questions regarding bills from these companies. ° °You will be contacted with the lab results as soon as they are available. The fastest way to get your results is to activate your My Chart account. Instructions are located on the last page of this paperwork. If you have not heard from us regarding the results in 2 weeks, please contact this office. °  ° ° ° °

## 2020-08-23 ENCOUNTER — Ambulatory Visit: Payer: Medicaid Other

## 2020-08-29 ENCOUNTER — Other Ambulatory Visit: Payer: Self-pay

## 2020-08-29 ENCOUNTER — Ambulatory Visit (INDEPENDENT_AMBULATORY_CARE_PROVIDER_SITE_OTHER): Payer: 59 | Admitting: Family Medicine

## 2020-08-29 DIAGNOSIS — Z8679 Personal history of other diseases of the circulatory system: Secondary | ICD-10-CM

## 2020-08-29 DIAGNOSIS — Z23 Encounter for immunization: Secondary | ICD-10-CM

## 2020-08-29 DIAGNOSIS — E781 Pure hyperglyceridemia: Secondary | ICD-10-CM

## 2020-08-29 DIAGNOSIS — Z1159 Encounter for screening for other viral diseases: Secondary | ICD-10-CM

## 2020-08-29 DIAGNOSIS — Z114 Encounter for screening for human immunodeficiency virus [HIV]: Secondary | ICD-10-CM

## 2020-08-29 LAB — LIPID PANEL
Chol/HDL Ratio: 3.7 ratio (ref 0.0–4.4)
Cholesterol, Total: 209 mg/dL — ABNORMAL HIGH (ref 100–199)
VLDL Cholesterol Cal: 31 mg/dL (ref 5–40)

## 2020-08-29 LAB — HEPATITIS C ANTIBODY

## 2020-08-29 LAB — COMPREHENSIVE METABOLIC PANEL
Albumin: 4.6 g/dL (ref 3.8–4.9)
BUN: 9 mg/dL (ref 6–24)

## 2020-08-29 NOTE — Patient Instructions (Signed)
° ° ° °  If you have lab work done today you will be contacted with your lab results within the next 2 weeks.  If you have not heard from us then please contact us. The fastest way to get your results is to register for My Chart. ° ° °IF you received an x-ray today, you will receive an invoice from [facility]. Please contact [facility] at [phone] with questions or concerns regarding your invoice.  ° °IF you received labwork today, you will receive an invoice from [facility]. Please contact [facility] at [phone] with questions or concerns regarding your invoice.  ° °Our billing staff will not be able to assist you with questions regarding bills from these companies. ° °You will be contacted with the lab results as soon as they are available. The fastest way to get your results is to activate your My Chart account. Instructions are located on the last page of this paperwork. If you have not heard from us regarding the results in 2 weeks, please contact this office. °  ° ° ° °

## 2020-08-30 LAB — COMPREHENSIVE METABOLIC PANEL
ALT: 23 IU/L (ref 0–32)
AST: 23 IU/L (ref 0–40)
Albumin/Globulin Ratio: 1.8 (ref 1.2–2.2)
Alkaline Phosphatase: 95 IU/L (ref 44–121)
BUN/Creatinine Ratio: 10 (ref 9–23)
Bilirubin Total: 0.2 mg/dL (ref 0.0–1.2)
CO2: 20 mmol/L (ref 20–29)
Calcium: 9.3 mg/dL (ref 8.7–10.2)
Chloride: 106 mmol/L (ref 96–106)
Creatinine, Ser: 0.88 mg/dL (ref 0.57–1.00)
GFR calc Af Amer: 87 mL/min/{1.73_m2} (ref >59)
GFR calc non Af Amer: 76 mL/min/{1.73_m2} (ref >59)
Globulin, Total: 2.6 g/dL (ref 1.5–4.5)
Glucose: 100 mg/dL — ABNORMAL HIGH (ref 65–99)
Potassium: 3.9 mmol/L (ref 3.5–5.2)
Sodium: 143 mmol/L (ref 134–144)
Total Protein: 7.2 g/dL (ref 6.0–8.5)

## 2020-08-30 LAB — LIPID PANEL
HDL: 56 mg/dL (ref >39)
LDL Chol Calc (NIH): 122 mg/dL — ABNORMAL HIGH (ref 0–99)
Triglycerides: 177 mg/dL — ABNORMAL HIGH (ref 0–149)

## 2020-08-30 LAB — HIV ANTIBODY (ROUTINE TESTING W REFLEX): HIV Screen 4th Generation wRfx: NONREACTIVE

## 2020-10-03 ENCOUNTER — Other Ambulatory Visit: Payer: Self-pay

## 2020-10-03 ENCOUNTER — Ambulatory Visit
Admission: RE | Admit: 2020-10-03 | Discharge: 2020-10-03 | Disposition: A | Discharge status: Home / Self Care | Payer: Medicaid Other | Source: Ambulatory Visit | Attending: Obstetrics and Gynecology | Admitting: Obstetrics and Gynecology

## 2020-10-03 DIAGNOSIS — Z1231 Encounter for screening mammogram for malignant neoplasm of breast: Secondary | ICD-10-CM

## 2020-11-14 ENCOUNTER — Ambulatory Visit: Payer: Self-pay | Admitting: Family Medicine

## 2021-05-01 ENCOUNTER — Ambulatory Visit: Payer: Medicaid Other

## 2021-05-08 ENCOUNTER — Other Ambulatory Visit: Payer: Self-pay | Admitting: Obstetrics and Gynecology

## 2021-05-08 DIAGNOSIS — Z1231 Encounter for screening mammogram for malignant neoplasm of breast: Secondary | ICD-10-CM

## 2021-07-17 ENCOUNTER — Encounter: Payer: Medicaid Other | Admitting: Obstetrics and Gynecology

## 2021-07-18 NOTE — Progress Notes (Signed)
55 y.o. G62P1001 Single African American female here for annual exam.    Feels a blockage sensation with intercourse.  Occasional discomfort.   Same partner for 27 years.   No bleeding.   Feels hot at night.   Not ready to quit smoking.   Son 52 years old.   PCP: Merri Ray, MD  Patient's last menstrual period was 10/06/2014.           Sexually active: No.  The current method of family planning is post menopausal status.    Exercising: No.  The patient does not participate in regular exercise at present. Smoker:  yes, smokes 1/2 to [place] Maintenance: Pap:  07-06-19 Neg, 06-28-16 Neg, 12-06-14 Neg History of abnormal Pap:  no MMG:  10-03-20 Neg/Birads1 Colonoscopy:  NEVER BMD:   n/a  Result  n/a TDaP:  PCP--Unsure Gardasil:   no HIV:08-29-20 NR Hep C: 08-29-20 Neg Screening Labs:  PCP.   reports that she has been smoking cigarettes. She has been smoking an average of .5 packs per day. She has never used smokeless tobacco. She reports that she does not currently use alcohol after a past usage of about 5.0 standard drinks per week. She reports that she does not use drugs.  Past Medical History:  Diagnosis Date   Allergy    Anemia    Bell's palsy    Fibroid    Glaucoma    Open   Hypertension    Stroke [facility])     Past Surgical History:  Procedure Laterality Date   BREAST EXCISIONAL BIOPSY Left    BREAST LUMPECTOMY WITH RADIOACTIVE SEED LOCALIZATION Left 01/12/2014   Procedure: LEFT BREAST LUMPECTOMY WITH RADIOACTIVE SEED LOCALIZATION;  Surgeon: Joyice Faster. Cornett, MD;  Location: [facility];  Service: General;  Laterality: Left;   BREAST SURGERY     Cyst removed   HYSTEROSCOPY  2002   myomectomy   MYOMECTOMY  10/2005   PELVIC LAPAROSCOPY     Expl. Lap.   REFRACTIVE SURGERY      Current Outpatient Medications  Medication Sig Dispense Refill   bimatoprost (LUMIGAN) 0.03 % ophthalmic solution Place 1 drop into both eyes at bedtime.      ibuprofen (ADVIL,MOTRIN) 200 MG tablet Take 400 mg by mouth every 6 (six) hours as needed for headache or moderate pain.     lisinopril (PRINIVIL,ZESTRIL) 10 MG tablet Take 1 tablet (10 mg total) by mouth daily. (Patient not taking: Reported on 07/06/2019) 60 tablet 0   No current facility-administered medications for this visit.    Family History  Problem Relation Age of Onset   Hypertension Mother    Diabetes Mother    Stroke Brother    Migraines Brother    Cancer Maternal Uncle        Unknown type   Migraines Brother    Heart disease Brother    Thyroid disease Brother     Review of Systems  All other systems reviewed and are negative.  Exam:   BP 118/76    Pulse 95    Ht 5\' 5"  (1.651 m)    Wt 153 lb (69.4 kg)    LMP 10/06/2014    SpO2 96%    BMI 25.46 kg/m     General appearance: alert, cooperative and appears stated age Head: normocephalic, without obvious abnormality, atraumatic Neck: no adenopathy, supple, symmetrical, trachea midline and thyroid normal to inspection and palpation Lungs: clear to auscultation bilaterally Breasts: normal appearance, no masses  or tenderness, No nipple retraction or dimpling, No nipple discharge or bleeding, No axillary adenopathy Heart: regular rate and rhythm Abdomen: soft, non-tender; no masses, no organomegaly Extremities: extremities normal, atraumatic, no cyanosis or edema Skin: skin color, texture, turgor normal. No rashes or lesions Lymph nodes: cervical, supraclavicular, and axillary nodes normal. Neurologic: grossly normal  Pelvic: External genitalia:  no lesions              No abnormal inguinal nodes palpated.              Urethra:  normal appearing urethra with no masses, tenderness or lesions              Bartholins and Skenes: normal                 Vagina: normal appearing vagina with normal color and discharge, no lesions              Cervix: no lesions              Pap taken: no Bimanual Exam:  Uterus:  normal size,  contour, position, consistency, mobility, non-tender              Adnexa: no mass, fullness, tenderness              Rectal exam: yes.  Confirms.              Anus:  normal sphincter tone, no lesions  Chaperone was present for exam:  yes.  Assessment:   Well woman visit with gynecologic exam. Hx left breast biopsy.  Colon cancer screening.  HTN.  Hx stroke.  Tobacco use.   Plan: Mammogram screening discussed. Self breast awareness reviewed. Pap and HR HPV as above. Guidelines for Calcium, Vitamin D, regular exercise program including cardiovascular and weight bearing exercise. Water based lubricants and cooking oils useful for vaginal atrophy.  Heart healthy diet, Mediterranean diet discussed Referral for screening colonoscopy.  We discussed nicotine replacement to assist with smoking cessation if desired. Follow up annually and prn.   After visit summary provided.

## 2021-07-20 ENCOUNTER — Encounter: Payer: Self-pay | Admitting: Obstetrics and Gynecology

## 2021-07-20 ENCOUNTER — Ambulatory Visit (INDEPENDENT_AMBULATORY_CARE_PROVIDER_SITE_OTHER): Payer: Self-pay | Admitting: Obstetrics and Gynecology

## 2021-07-20 ENCOUNTER — Other Ambulatory Visit: Payer: Self-pay

## 2021-07-20 VITALS — BP 118/76 | HR 95 | Ht 65.0 in | Wt 153.0 lb

## 2021-07-20 DIAGNOSIS — Z01419 Encounter for gynecological examination (general) (routine) without abnormal findings: Secondary | ICD-10-CM

## 2021-07-20 DIAGNOSIS — Z1211 Encounter for screening for malignant neoplasm of colon: Secondary | ICD-10-CM

## 2021-07-20 NOTE — Patient Instructions (Signed)
EXERCISE AND DIET:  We recommended that you start or continue a regular exercise program for good health. Regular exercise means any activity that makes your heart beat faster and makes you sweat.  We recommend exercising at least 30 minutes per day at least 3 days a week, preferably 4 or 5.  We also recommend a diet low in fat and sugar.  Inactivity, poor dietary choices and obesity can cause diabetes, heart attack, stroke, and kidney damage, among others.   ° °ALCOHOL AND SMOKING:  Women should limit their alcohol intake to no more than 7 drinks/beers/glasses of wine (combined, not each!) per week. Moderation of alcohol intake to this level decreases your risk of breast cancer and liver damage. And of course, no recreational drugs are part of a healthy lifestyle.  And absolutely no smoking or even second hand smoke. Most people know smoking can cause heart and lung diseases, but did you know it also contributes to weakening of your bones? Aging of your skin?  Yellowing of your teeth and nails? ° °CALCIUM AND VITAMIN D:  Adequate intake of calcium and Vitamin D are recommended.  The recommendations for exact amounts of these supplements seem to change often, but generally speaking 600 mg of calcium (either carbonate or citrate) and 800 units of Vitamin D per day seems prudent. Certain women may benefit from higher intake of Vitamin D.  If you are among these women, your doctor will have told you during your visit.   ° °PAP SMEARS:  Pap smears, to check for cervical cancer or precancers,  have traditionally been done yearly, although recent scientific advances have shown that most women can have pap smears less often.  However, every woman still should have a physical exam from her gynecologist every year. It will include a breast check, inspection of the vulva and vagina to check for abnormal growths or skin changes, a visual exam of the cervix, and then an exam to evaluate the size and shape of the uterus and  ovaries.  And after 53 years of age, a rectal exam is indicated to check for rectal cancers. We will also provide age appropriate advice regarding health maintenance, like when you should have certain vaccines, screening for sexually transmitted diseases, bone density testing, colonoscopy, mammograms, etc.  ° °MAMMOGRAMS:  All women over 40 years old should have a yearly mammogram. Many facilities now offer a "3D" mammogram, which may cost around $50 extra out of pocket. If possible,  we recommend you accept the option to have the 3D mammogram performed.  It both reduces the number of women who will be called back for extra views which then turn out to be normal, and it is better than the routine mammogram at detecting truly abnormal areas.   ° °COLONOSCOPY:  Colonoscopy to screen for colon cancer is recommended for all women at age 50.  We know, you hate the idea of the prep.  We agree, BUT, having colon cancer and not knowing it is worse!!  Colon cancer so often starts as a polyp that can be seen and removed at colonscopy, which can quite literally save your life!  And if your first colonoscopy is normal and you have no family history of colon cancer, most women don't have to have it again for 10 years.  Once every ten years, you can do something that may end up saving your life, right?  We will be happy to help you get it scheduled when you are ready.    Be sure to check your insurance coverage so you understand how much it will cost.  It may be covered as a preventative service at no cost, but you should check your particular policy.    Mediterranean Diet A Mediterranean diet refers to food and lifestyle choices that are based on the traditions of countries located on the [employer]. It focuses on eating more fruits, vegetables, whole grains, beans, nuts, seeds, and heart-healthy fats, and eating less dairy, meat, eggs, and processed foods with added sugar, salt, and fat. This way of eating has been  shown to help prevent certain conditions and improve outcomes for people who have chronic diseases, like kidney disease and heart disease. What are tips for following this plan? Reading food labels Check the serving size of packaged foods. For foods such as rice and pasta, the serving size refers to the amount of cooked product, not dry. Check the total fat in packaged foods. Avoid foods that have saturated fat or trans fats. Check the ingredient list for added sugars, such as corn syrup. Shopping  Buy a variety of foods that offer a balanced diet, including: Fresh fruits and vegetables (produce). Grains, beans, nuts, and seeds. Some of these may be available in unpackaged forms or large amounts (in bulk). Fresh seafood. Poultry and eggs. Low-fat dairy products. Buy whole ingredients instead of prepackaged foods. Buy fresh fruits and vegetables in-season from local farmers markets. Buy plain frozen fruits and vegetables. If you do not have access to quality fresh seafood, buy precooked frozen shrimp or canned fish, such as tuna, salmon, or sardines. Stock your pantry so you always have certain foods on hand, such as olive oil, canned tuna, canned tomatoes, rice, pasta, and beans. Cooking Cook foods with extra-virgin olive oil instead of using butter or other vegetable oils. Have meat as a side dish, and have vegetables or grains as your main dish. This means having meat in small portions or adding small amounts of meat to foods like pasta or stew. Use beans or vegetables instead of meat in common dishes like chili or lasagna. Experiment with different cooking methods. Try roasting, broiling, steaming, and sauting vegetables. Add frozen vegetables to soups, stews, pasta, or rice. Add nuts or seeds for added healthy fats and plant protein at each meal. You can add these to yogurt, salads, or vegetable dishes. Marinate fish or vegetables using olive oil, lemon juice, garlic, and fresh  herbs. Meal planning Plan to eat one vegetarian meal one day each week. Try to work up to two vegetarian meals, if possible. Eat seafood two or more times a week. Have healthy snacks readily available, such as: Vegetable sticks with hummus. Greek yogurt. Fruit and nut trail mix. Eat balanced meals throughout the week. This includes: Fruit: 2-3 servings a day. Vegetables: 4-5 servings a day. Low-fat dairy: 2 servings a day. Fish, poultry, or lean meat: 1 serving a day. Beans and legumes: 2 or more servings a week. Nuts and seeds: 1-2 servings a day. Whole grains: 6-8 servings a day. Extra-virgin olive oil: 3-4 servings a day. Limit red meat and sweets to only a few servings a month. Lifestyle  Cook and eat meals together with your family, when possible. Drink enough fluid to keep your urine pale yellow. Be physically active every day. This includes: Aerobic exercise like running or swimming. Leisure activities like gardening, walking, or housework. Get 7-8 hours of sleep each night. If recommended by your health care provider, drink red wine in moderation.  This means 1 glass a day for nonpregnant women and 2 glasses a day for men. A glass of wine equals 5 oz (150 mL). What foods should I eat? Fruits Apples. Apricots. Avocado. Berries. Bananas. Cherries. Dates. Figs. Grapes. Lemons. Melon. Oranges. Peaches. Plums. Pomegranate. Vegetables Artichokes. Beets. Broccoli. Cabbage. Carrots. Eggplant. Green beans. Chard. Kale. Spinach. Onions. Leeks. Peas. Squash. Tomatoes. Peppers. Radishes. Grains Whole-grain pasta. Brown rice. Bulgur wheat. Polenta. Couscous. Whole-wheat bread. [place]. Meats and other proteins Beans. Almonds. Sunflower seeds. Pine nuts. Peanuts. [place]. Salmon. Scallops. Shrimp. [place]. Tilapia. Clams. Oysters. Eggs. Poultry without skin. Dairy Low-fat milk. Cheese. Greek yogurt. Fats and oils Extra-virgin olive oil. Avocado oil. Grapeseed oil. Beverages Water.  Red wine. Herbal tea. Sweets and desserts Greek yogurt with honey. Baked apples. Poached pears. Trail mix. Seasonings and condiments Basil. Cilantro. Coriander. Cumin. Mint. Parsley. Sage. Rosemary. Tarragon. Garlic. Oregano. Thyme. Pepper. Balsamic vinegar. Tahini. Hummus. Tomato sauce. Olives. Mushrooms. The items listed above may not be a complete list of foods and beverages you can eat. Contact a dietitian for more information. What foods should I limit? This is a list of foods that should be eaten rarely or only on special occasions. Fruits Fruit canned in syrup. Vegetables Deep-fried potatoes (french fries). Grains Prepackaged pasta or rice dishes. Prepackaged cereal with added sugar. Prepackaged snacks with added sugar. Meats and other proteins Beef. Pork. Lamb. Poultry with skin. Hot dogs. [place] Salines. Dairy Ice cream. Sour cream. Whole milk. Fats and oils Butter. Canola oil. Vegetable oil. Beef fat (tallow). Lard. Beverages Juice. Sugar-sweetened soft drinks. Beer. Liquor and spirits. Sweets and desserts Cookies. Cakes. Pies. Candy. Seasonings and condiments Mayonnaise. Pre-made sauces and marinades. The items listed above may not be a complete list of foods and beverages you should limit. Contact a dietitian for more information. Summary The Mediterranean diet includes both food and lifestyle choices. Eat a variety of fresh fruits and vegetables, beans, nuts, seeds, and whole grains. Limit the amount of red meat and sweets that you eat. If recommended by your health care provider, drink red wine in moderation. This means 1 glass a day for nonpregnant women and 2 glasses a day for men. A glass of wine equals 5 oz (150 mL). This information is not intended to replace advice given to you by your health care provider. Make sure you discuss any questions you have with your health care provider. Document Revised: 08/21/2019 Document Reviewed: 06/18/2019 Elsevier Patient Education   2022 [employer].

## 2021-09-18 ENCOUNTER — Encounter: Payer: Medicaid Other | Admitting: Family Medicine

## 2021-10-09 ENCOUNTER — Ambulatory Visit
Admission: RE | Admit: 2021-10-09 | Discharge: 2021-10-09 | Disposition: A | Discharge status: Home / Self Care | Payer: Medicaid Other | Source: Ambulatory Visit | Attending: Obstetrics and Gynecology | Admitting: Obstetrics and Gynecology

## 2021-10-09 ENCOUNTER — Ambulatory Visit: Payer: Medicaid Other

## 2021-10-09 ENCOUNTER — Other Ambulatory Visit: Payer: Self-pay | Admitting: Critical Care Medicine

## 2021-10-09 ENCOUNTER — Other Ambulatory Visit: Payer: Self-pay | Admitting: Family Medicine

## 2021-10-09 DIAGNOSIS — Z1231 Encounter for screening mammogram for malignant neoplasm of breast: Secondary | ICD-10-CM

## 2021-11-16 ENCOUNTER — Encounter: Payer: Self-pay | Admitting: Family Medicine

## 2021-11-16 ENCOUNTER — Ambulatory Visit (INDEPENDENT_AMBULATORY_CARE_PROVIDER_SITE_OTHER): Payer: Medicaid Other | Admitting: Family Medicine

## 2021-11-16 VITALS — BP 128/72 | HR 74 | Temp 98.2°F | Resp 15 | Ht 65.0 in | Wt 153.8 lb

## 2021-11-16 DIAGNOSIS — F17210 Nicotine dependence, cigarettes, uncomplicated: Secondary | ICD-10-CM

## 2021-11-16 DIAGNOSIS — Z23 Encounter for immunization: Secondary | ICD-10-CM

## 2021-11-16 DIAGNOSIS — Z131 Encounter for screening for diabetes mellitus: Secondary | ICD-10-CM

## 2021-11-16 DIAGNOSIS — F418 Other specified anxiety disorders: Secondary | ICD-10-CM

## 2021-11-16 DIAGNOSIS — Z8669 Personal history of other diseases of the nervous system and sense organs: Secondary | ICD-10-CM

## 2021-11-16 DIAGNOSIS — Z1211 Encounter for screening for malignant neoplasm of colon: Secondary | ICD-10-CM

## 2021-11-16 DIAGNOSIS — Z0000 Encounter for general adult medical examination without abnormal findings: Secondary | ICD-10-CM

## 2021-11-16 DIAGNOSIS — Z130 Encounter for screening for diseases of the blood and blood-forming organs and certain disorders involving the immune mechanism: Secondary | ICD-10-CM

## 2021-11-16 DIAGNOSIS — E781 Pure hyperglyceridemia: Secondary | ICD-10-CM

## 2021-11-16 MED ORDER — SERTRALINE HCL 25 MG PO TABS: 25.0000 mg | ORAL_TABLET | Freq: Every day | ORAL | 3 refills | Status: DC

## 2021-11-16 NOTE — Patient Instructions (Addendum)
Start sertraline low dose for anxiety and depression and recheck in 6 weeks. See other info below.  ?I will refer you for colonoscopy and new eye doctor. ?Let me know when you are ready to quit smoking, I am happy to help.  ?Thanks for coming in today.  ? ? ?Managing Anxiety, Adult ?After being diagnosed with anxiety, you may be relieved to know why you have felt or behaved a certain way. You may also feel overwhelmed about the treatment ahead and what it will mean for your life. With care and support, you can manage this condition. ?How to manage lifestyle changes ?Managing stress and anxiety ? ?Stress is your body's reaction to life changes and events, both good and bad. Most stress will last just a few hours, but stress can be ongoing and can lead to more than just stress. Although stress can play a major role in anxiety, it is not the same as anxiety. Stress is usually caused by something external, such as a deadline, test, or competition. Stress normally passes after the triggering event has ended.  ?Anxiety is caused by something internal, such as imagining a terrible outcome or worrying that something will go wrong that will devastate you. Anxiety often does not go away even after the triggering event is over, and it can become long-term (chronic) worry. It is important to understand the differences between stress and anxiety and to manage your stress effectively so that it does not lead to an anxious response. ?Talk with your health care provider or a counselor to learn more about reducing anxiety and stress. He or she may suggest tension reduction techniques, such as: ?Music therapy. Spend time creating or listening to music that you enjoy and that inspires you. ?Mindfulness-based meditation. Practice being aware of your normal breaths while not trying to control your breathing. It can be done while sitting or walking. ?Centering prayer. This involves focusing on a word, phrase, or sacred image that means  something to you and brings you peace. ?Deep breathing. To do this, expand your stomach and inhale slowly through your nose. Hold your breath for 3-5 seconds. Then exhale slowly, letting your stomach muscles relax. ?Self-talk. Learn to notice and identify thought patterns that lead to anxiety reactions and change those patterns to thoughts that feel peaceful. ?Muscle relaxation. Taking time to tense muscles and then relax them. ?Choose a tension reduction technique that fits your lifestyle and personality. These techniques take time and practice. Set aside 5-15 minutes a day to do them. Therapists can offer counseling and training in these techniques. The training to help with anxiety may be covered by some insurance plans. ?Other things you can do to manage stress and anxiety include: ?Keeping a stress diary. This can help you learn what triggers your reaction and then learn ways to manage your response. ?Thinking about how you react to certain situations. You may not be able to control everything, but you can control your response. ?Making time for activities that help you relax and not feeling guilty about spending your time in this way. ?Doing visual imagery. This involves imagining or creating mental pictures to help you relax. ?Practicing yoga. Through yoga poses, you can lower tension and promote relaxation. ? ?Medicines ?Medicines can help ease symptoms. Medicines for anxiety include: ?Antidepressant medicines. These are usually prescribed for long-term daily control. ?Anti-anxiety medicines. These may be added in severe cases, especially when panic attacks occur. ?Medicines will be prescribed by a health care provider. When used together,  medicines, psychotherapy, and tension reduction techniques may be the most effective treatment. ?Relationships ?Relationships can play a big part in helping you recover. Try to spend more time connecting with trusted friends and family members. ?Consider going to couples  counseling if you have a partner, taking family education classes, or going to family therapy. ?Therapy can help you and others better understand your condition. ?How to recognize changes in your anxiety ?Everyone responds differently to treatment for anxiety. Recovery from anxiety happens when symptoms decrease and stop interfering with your daily activities at home or work. This may mean that you will start to: ?Have better concentration and focus. Worry will interfere less in your daily thinking. ?Sleep better. ?Be less irritable. ?Have more energy. ?Have improved memory. ?It is also important to recognize when your condition is getting worse. Contact your health care provider if your symptoms interfere with home or work and you feel like your condition is not improving. ?Follow these instructions at home: ?Activity ?Exercise. Adults should do the following: ?Exercise for at least 150 minutes each week. The exercise should increase your heart rate and make you sweat (moderate-intensity exercise). ?Strengthening exercises at least twice a week. ?Get the right amount and quality of sleep. Most adults need 7-9 hours of sleep each night. ?Lifestyle ? ?Eat a healthy diet that includes plenty of vegetables, fruits, whole grains, low-fat dairy products, and lean protein. ?Do not eat a lot of foods that are high in fats, added sugars, or salt (sodium). ?Make choices that simplify your life. ?Do not use any products that contain nicotine or tobacco. These products include cigarettes, chewing tobacco, and vaping devices, such as e-cigarettes. If you need help quitting, ask your health care provider. ?Avoid caffeine, alcohol, and certain over-the-counter cold medicines. These may make you feel worse. Ask your pharmacist which medicines to avoid. ?General instructions ?Take over-the-counter and prescription medicines only as told by your health care provider. ?Keep all follow-up visits. This is important. ?Where to find  support ?You can get help and support from these sources: ?Self-help groups. ?Online and [employer]. ?A trusted spiritual leader. ?Couples counseling. ?Family education classes. ?Family therapy. ?Where to find more information ?You may find that joining a support group helps you deal with your anxiety. The following sources can help you locate counselors or support groups near you: ?[place]: www.mentalhealth[place].net ?Anxiety and Depression Association of America (ADAA): https://www.clark.net/ ?National Alliance on Mental Illness (NAMI): www.nami.org ?Contact a health care provider if: ?You have a hard time staying focused or finishing daily tasks. ?You spend many hours a day feeling worried about everyday life. ?You become exhausted by worry. ?You start to have headaches or frequently feel tense. ?You develop chronic nausea or diarrhea. ?Get help right away if: ?You have a racing heart and shortness of breath. ?You have thoughts of hurting yourself or others. ?If you ever feel like you may hurt yourself or others, or have thoughts about taking your own life, get help right away. Go to your nearest emergency department or: ?Call your local emergency services (911 in the [place] ?Call a suicide crisis helpline, such as the [place] at 732 488 2696 or 988 in the [place]. This is open 24 hours a day in the [place] ?Text the Crisis Text Line at 801 018 1865 (in the [place].). ?Summary ?Taking steps to learn and use tension reduction techniques can help calm you and help prevent triggering an anxiety reaction. ?When used together, medicines, psychotherapy, and tension reduction techniques may be  the most effective treatment. ?Family, friends, and partners can play a big part in supporting you. ?This information is not intended to replace advice given to you by your health care provider. Make sure you discuss any questions you have with your health care provider. ?Document Revised:  02/08/2021 Document Reviewed: 11/06/2020 ?Elsevier Patient Education ? [place]. ? ? ?Preventive Care 30-100 Years Old, Female ?Preventive care refers to lifestyle choices and visits with your health care pr
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
## 2021-11-17 LAB — CBC WITH DIFFERENTIAL/PLATELET
Basophils Absolute: 0 10*3/uL (ref 0.0–0.1)
Basophils Relative: 1 % (ref 0.0–3.0)
Eosinophils Absolute: 0.2 10*3/uL (ref 0.0–0.7)
Eosinophils Relative: 3.3 % (ref 0.0–5.0)
HCT: 42.2 % (ref 36.0–46.0)
Hemoglobin: 14.1 g/dL (ref 12.0–15.0)
Lymphocytes Relative: 44.5 % (ref 12.0–46.0)
Lymphs Abs: 2.3 10*3/uL (ref 0.7–4.0)
MCHC: 33.4 g/dL (ref 30.0–36.0)
MCV: 89.6 fl (ref 78.0–100.0)
Monocytes Absolute: 0.4 10*3/uL (ref 0.1–1.0)
Monocytes Relative: 7.3 % (ref 3.0–12.0)
Neutro Abs: 2.3 10*3/uL (ref 1.4–7.7)
Neutrophils Relative %: 43.9 % (ref 43.0–77.0)
Platelets: 243 10*3/uL (ref 150.0–400.0)
RBC: 4.71 Mil/uL (ref 3.87–5.11)
RDW: 14.1 % (ref 11.5–15.5)
WBC: 5.2 10*3/uL (ref 4.0–10.5)

## 2021-11-17 LAB — COMPREHENSIVE METABOLIC PANEL
ALT: 20 U/L (ref 0–35)
AST: 21 U/L (ref 0–37)
Albumin: 4.3 g/dL (ref 3.5–5.2)
Alkaline Phosphatase: 59 U/L (ref 39–117)
BUN: 9 mg/dL (ref 6–23)
CO2: 24 mEq/L (ref 19–32)
Calcium: 9 mg/dL (ref 8.4–10.5)
Chloride: 108 mEq/L (ref 96–112)
Creatinine, Ser: 0.81 mg/dL (ref 0.40–1.20)
GFR: 82.78 mL/min (ref >60.00)
Glucose, Bld: 78 mg/dL (ref 70–99)
Potassium: 3.9 mEq/L (ref 3.5–5.1)
Sodium: 142 mEq/L (ref 135–145)
Total Bilirubin: 0.5 mg/dL (ref 0.2–1.2)
Total Protein: 6.8 g/dL (ref 6.0–8.3)

## 2021-11-17 LAB — LIPID PANEL
Cholesterol: 200 mg/dL (ref 0–200)
HDL: 58.6 mg/dL (ref >39.00)
LDL Cholesterol: 113 mg/dL — ABNORMAL HIGH (ref 0–99)
NonHDL: 141.58
Total CHOL/HDL Ratio: 3
Triglycerides: 141 mg/dL (ref 0.0–149.0)
VLDL: 28.2 mg/dL (ref 0.0–40.0)

## 2021-11-17 LAB — HEMOGLOBIN A1C: Hgb A1c MFr Bld: 6.8 % — ABNORMAL HIGH (ref 4.6–6.5)

## 2022-01-04 ENCOUNTER — Encounter: Payer: Self-pay | Admitting: Family Medicine

## 2022-01-04 ENCOUNTER — Ambulatory Visit (INDEPENDENT_AMBULATORY_CARE_PROVIDER_SITE_OTHER): Payer: Self-pay | Admitting: Family Medicine

## 2022-01-04 VITALS — BP 124/70 | HR 84 | Temp 98.4°F | Resp 16 | Ht 65.0 in | Wt 158.4 lb

## 2022-01-04 DIAGNOSIS — Z8679 Personal history of other diseases of the circulatory system: Secondary | ICD-10-CM

## 2022-01-04 DIAGNOSIS — F418 Other specified anxiety disorders: Secondary | ICD-10-CM

## 2022-01-04 DIAGNOSIS — R7309 Other abnormal glucose: Secondary | ICD-10-CM

## 2022-01-04 DIAGNOSIS — R42 Dizziness and giddiness: Secondary | ICD-10-CM

## 2022-01-04 LAB — GLUCOSE, POCT (MANUAL RESULT ENTRY): POC Glucose: 103 mg/dl — AB (ref 70–99)

## 2022-01-04 MED ORDER — MECLIZINE HCL 25 MG PO TABS: 25.0000 mg | ORAL_TABLET | Freq: Three times a day (TID) | ORAL | 0 refills | Status: DC | PRN

## 2022-01-04 MED ORDER — LISINOPRIL 10 MG PO TABS: 10.0000 mg | ORAL_TABLET | Freq: Every day | ORAL | 3 refills | Status: DC

## 2022-01-04 NOTE — Progress Notes (Signed)
Subjective:  Patient ID: Brandi Cooke, female    DOB: July 08, 1966  Age: 56 y.o. MRN: [id number]  CC:  Chief Complaint  Patient presents with   Depression    Recheck on depression doing well    Anxiety    Recheck on anxiety  Doing well     HPI GLORIOUS FLICKER presents for   Depression/anxiety Discussed at last visit.  Situational anxiety, longstanding.  Depression more related to certain weather.  Some anhedonia, some avoidance of crowds.  With longstanding symptoms, specifically anxiety and worsening over time recommended counseling (declined) and low-dose SSRI with Zoloft 25 mg daily.  Handout given on anxiety management Doing so-so.  Did not take sertraline - did not pick up med. Forgot about it  Anxiety about the same. Notes more with driving.  No changes in managing anxiety.  Some walking for exercise. Feels better after walking (2 times per week, 1 hour)     01/04/2022    3:18 PM 11/16/2021    3:14 PM 11/16/2021    3:10 PM 08/15/2020    1:33 PM  Depression screen PHQ 2/9  Decreased Interest 1 1 0 0  Down, Depressed, Hopeless 1 1 0 0  PHQ - 2 Score 2 2 0 0  Altered sleeping 1 0    Tired, decreased energy 1 1    Change in appetite 0 0    Feeling bad or failure about yourself  0 1    Trouble concentrating 0 1    Moving slowly or fidgety/restless 0 0    Suicidal thoughts 0 0    PHQ-9 Score 4 5    Difficult doing work/chores Somewhat difficult         01/04/2022    3:20 PM 11/16/2021    3:16 PM  GAD 7 : Generalized Anxiety Score  Nervous, Anxious, on Edge 1 2  Control/stop worrying 1 3  Worry too much - different things 1 2  Trouble relaxing 1 1  Restless 1 0  Easily annoyed or irritable 1 1  Afraid - awful might happen 1 2  Total GAD 7 Score 7 11  Anxiety Difficulty Somewhat difficult     Dizziness: 2 weeks ago, at morning job, warehouse work. Working at speed, felt dizzy - like room spinning, held on to desk, no fall, no focal weakness or HA. Felt  better after drinking water. Slight lightheaded, but better in 51mns. Back to self few hours later.  Happened once again about a week ago. Working at home, cleaning, washing clothes, same sx - room spinning, felt like rollercoaster, treated with splashing face and drinking water. Better after sleep for about 2 hrs, back to normal self No CP/palpitations.  No focal weakness or headache.  No recent BP meds. Has not checked BP.  Drinks some sodas - 2 per day.  Coffee 11-1.5 cups per day.  No energy drinks.  Recent A1c at DM level - 6.8, but glucose 78 in April. No tinnitus or hearing changes.   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 Cornett, MD;  Location: [facility];  Service: General;  Laterality: Left;   BREAST SURGERY     Cyst removed   HYSTEROSCOPY  2002   myomectomy   MYOMECTOMY  10/2005   PELVIC LAPAROSCOPY     Expl. Lap.   REFRACTIVE SURGERY     Allergies  Allergen Reactions   Latex Itching and Rash   Prior to Admission medications   Medication Sig Start Date End Date Taking? Authorizing Provider  albuterol (VENTOLIN HFA) 108 (90 Base) MCG/ACT inhaler 1 to 2 puffs every 4-6 hours as needed for wheezing, shortness of breath.  If 2 puffs, separate by 5 minutes. 09/17/21  Yes [provider]  bimatoprost (LUMIGAN) 0.03 % ophthalmic solution Place 1 drop into both eyes at  bedtime.   Yes [provider]  ibuprofen (ADVIL,MOTRIN) 200 MG tablet Take 400 mg by mouth every 6 (six) hours as needed for headache or moderate pain.   Yes [provider]  lisinopril (PRINIVIL,ZESTRIL) 10 MG tablet Take 1 tablet (10 mg total) by mouth daily. 06/22/18  Yes Lacretia Leigh, MD  lisinopril (ZESTRIL) 10 MG tablet Take 1 tablet (10 mg total) by mouth daily. 01/04/22  Yes Wendie Agreste, MD  sertraline (ZOLOFT) 25 MG tablet Take 1 tablet (25 mg total) by mouth daily. 11/16/21  Yes Wendie Agreste, MD   Social History   Socioeconomic History   Marital status: Single    Spouse name: Not on file   Number of children: Not on file   Years of education: Not on file   Highest education level: Not on file  Occupational History   Not on file  Tobacco Use   Smoking status: Every Day    Packs/day: 0.50    Types: Cigarettes   Smokeless tobacco: Never  Vaping Use   Vaping Use: Never used  Substance and Sexual Activity   Alcohol use: Not Currently    Alcohol/week: 5.0 standard drinks of alcohol    Types: 5 Cans of beer per week   Drug use: No   Sexual activity: Not Currently    Comment: 1st intercourse 56 yo-Fewer than 5 partners  Other Topics Concern   Not on file  Social History Narrative   Not on file   Social Determinants of Health   Financial Resource Strain: Not on file  Food Insecurity: Not on file  Transportation Needs: Not on file  Physical Activity: Not on file  Stress: Not on file  Social Connections: Not on file  Intimate Partner Violence: Not on file    Review of Systems   Objective:   Vitals:   01/04/22 1521  BP: 124/70  Pulse: 84  Resp: 16  Temp: 98.4 F (36.9 C)  TempSrc: Temporal  SpO2: 98%  Weight: 158 lb 6 oz (71.8 kg)  Height: '5\' 5"'$  (1.651 m)     Physical Exam Vitals reviewed.  Constitutional:      Appearance: Normal appearance. She is well-developed.  HENT:     Head: Normocephalic and atraumatic.  Eyes:      Extraocular Movements: Extraocular movements intact.     Right eye: No nystagmus.     Left eye: No nystagmus.  Conjunctiva/sclera: Conjunctivae normal.     Pupils: Pupils are equal, round, and reactive to light.  Neck:     Vascular: No carotid bruit.  Cardiovascular:     Rate and Rhythm: Normal rate and regular rhythm.     Heart sounds: Normal heart sounds.  Pulmonary:     Effort: Pulmonary effort is normal.     Breath sounds: Normal breath sounds.  Abdominal:     Palpations: Abdomen is soft. There is no pulsatile mass.     Tenderness: There is no abdominal tenderness.  Musculoskeletal:     Right lower leg: No edema.     Left lower leg: No edema.  Skin:    General: Skin is warm and dry.  Neurological:     Mental Status: She is alert and oriented to person, place, and time.  Psychiatric:        Mood and Affect: Mood normal.        Behavior: Behavior normal.      Results for orders placed or performed in visit on 01/04/22  POCT glucose (manual entry)  Result Value Ref Range   POC Glucose 103 (A) 70 - 99 mg/dl     Assessment & Plan:  KARSON CHICAS is a 57 y.o. female . Anxiety with depression  - similar anxiety.  Recommended she start sertraline prescribed last visit, continue walking, coping techniques.  Recheck 6 weeks  History of hypertension - Plan: DISCONTINUED: lisinopril (ZESTRIL) 10 MG tablet  -Blood pressure has been overall stable off meds.  Hold on starting meds at this time.  Lisinopril initially sent in, will be canceled.  Elevated hemoglobin A1c - Plan: POCT glucose (manual entry)  -A1c at diabetes level, but glucose looked okay last visit.  Decreased sodas/sugar-containing beverages and sweets, recheck A1c at next visit.  Dizziness - Plan: POCT glucose (manual entry), meclizine (ANTIVERT) 25 MG tablet Vertigo - Plan: meclizine (ANTIVERT) 25 MG tablet  -Symptoms sound like vertigo, 2 episodes.  Nonfocal neurologic exam currently.  -Increase  hydration, decrease caffeine discussed, meclizine if symptoms recur with RTC precautions.  Meds ordered this encounter  Medications   DISCONTD: lisinopril (ZESTRIL) 10 MG tablet    Sig: Take 1 tablet (10 mg total) by mouth daily.    Dispense:  90 tablet    Refill:  3   meclizine (ANTIVERT) 25 MG tablet    Sig: Take 1 tablet (25 mg total) by mouth 3 (three) times daily as needed for dizziness.    Dispense:  30 tablet    Refill:  0   Patient Instructions  Keep up walking as exercise can help with managing stress.  I do recommend starting sertraline to help with anxiety. Follow up in 6 weeks to see how that is going.   Blood sugar average was at diabetes level in April but barely there and blood sugar was okay at that office visit.  Cut back on sodas and sweets as possible and I think we can probably avoid new meds.  No blood pressure medicine needed for now.  Check your blood pressure outside of the office and if you have readings over 140/90, let me know.  Dizziness could have been related to vertigo, see information below.  Meclizine if needed if the symptoms return, but make sure to drink plenty of fluids and try to avoid more than 1-2 caffeinated drinks per day.  Recheck in 6 weeks, sooner if new or worsening symptoms.  Thank you for coming in today.  Vertigo Vertigo is the feeling that you or your surroundings are moving when they are not. This feeling can come and go at any time. Vertigo often goes away on its own. Vertigo can be dangerous if it occurs while you are doing something that could endanger yourself or others, such as driving or operating machinery. Your health care provider will do tests to try to determine the cause of your vertigo. Tests will also help your health care provider decide how best to treat your condition. Follow these instructions at home: Eating and drinking     Dehydration can make vertigo worse. Drink enough fluid to keep your urine pale yellow. Do  not drink alcohol. Activity Return to your normal activities as told by your health care provider. Ask your health care provider what activities are safe for you. In the morning, first sit up on the side of the bed. When you feel okay, stand slowly while you hold onto something until you know that your balance is fine. Move slowly. Avoid sudden body or head movements or certain positions, as told by your health care provider. If you have trouble walking or keeping your balance, try using a cane for stability. If you feel dizzy or unstable, sit down right away. Avoid doing any tasks that would cause danger to you or others if vertigo occurs. Avoid bending down if you feel dizzy. Place items in your home so that they are easy for you to reach without bending or leaning over. Do not drive or use machinery if you feel dizzy. General instructions Take over-the-counter and prescription medicines only as told by your health care provider. Keep all follow-up visits. This is important. Contact a health care provider if: Your medicines do not relieve your vertigo or they make it worse. Your condition gets worse or you develop new symptoms. You have a fever. You develop nausea or vomiting, or if nausea gets worse. Your family or friends notice any behavioral changes. You have numbness or a prickling and tingling sensation in part of your body. Get help right away if you: Are always dizzy or you faint. Develop severe headaches. Develop a stiff neck. Develop sensitivity to light. Have difficulty moving or speaking. Have weakness in your hands, arms, or legs. Have changes in your hearing or vision. These symptoms may represent a serious problem that is an emergency. Do not wait to see if the symptoms will go away. Get medical help right away. Call your local emergency services (911 in the [place] Do not drive yourself to the hospital. Summary Vertigo is the feeling that you or your surroundings are  moving when they are not. Your health care provider will do tests to try to determine the cause of your vertigo. Follow instructions for home care. You may be told to avoid certain tasks, positions, or movements. Contact a health care provider if your medicines do not relieve your symptoms, or if you have a fever, nausea, vomiting, or changes in behavior. Get help right away if you have severe headaches or difficulty speaking, or you develop hearing or vision problems. This information is not intended to replace advice given to you by your health care provider. Make sure you discuss any questions you have with your health care provider. Document Revised: 06/15/2020 Document Reviewed: 06/15/2020 Elsevier Patient Education  [place].   Dizziness Dizziness is a common problem. It is a feeling of unsteadiness or light-headedness. You may feel like you are about to faint. Dizziness can  lead to injury if you stumble or fall. Anyone can become dizzy, but dizziness is more common in older adults. This condition can be caused by a number of things, including medicines, dehydration, or illness. Follow these instructions at home: Eating and drinking  Drink enough fluid to keep your urine pale yellow. This helps to keep you from becoming dehydrated. Try to drink more clear fluids, such as water. Do not drink alcohol. Limit your caffeine intake if told to do so by your health care provider. Check ingredients and nutrition facts to see if a food or beverage contains caffeine. Limit your salt (sodium) intake if told to do so by your health care provider. Check ingredients and nutrition facts to see if a food or beverage contains sodium. Activity  Avoid making quick movements. Rise slowly from chairs and steady yourself until you feel okay. In the morning, first sit up on the side of the bed. When you feel okay, stand slowly while you hold onto something until you know that your balance is good. If  you need to stand in one place for a long time, move your legs often. Tighten and relax the muscles in your legs while you are standing. Do not drive or use machinery if you feel dizzy. Avoid bending down if you feel dizzy. Place items in your home so that they are easy for you to reach without leaning over. Lifestyle Do not use any products that contain nicotine or tobacco. These products include cigarettes, chewing tobacco, and vaping devices, such as e-cigarettes. If you need help quitting, ask your health care provider. Try to reduce your stress level by using methods such as yoga or meditation. Talk with your health care provider if you need help to manage your stress. General instructions Watch your dizziness for any changes. Take over-the-counter and prescription medicines only as told by your health care provider. Talk with your health care provider if you think that your dizziness is caused by a medicine that you are taking. Tell a friend or a family member that you are feeling dizzy. If he or she notices any changes in your behavior, have this person call your health care provider. Keep all follow-up visits. This is important. Contact a health care provider if: Your dizziness does not go away or you have new symptoms. Your dizziness or light-headedness gets worse. You feel nauseous. You have reduced hearing. You have a fever. You have neck pain or a stiff neck. Your dizziness leads to an injury or a fall. Get help right away if: You vomit or have diarrhea and are unable to eat or drink anything. You have problems talking, walking, swallowing, or using your arms, hands, or legs. You feel generally weak. You have any bleeding. You are not thinking clearly or you have trouble forming sentences. It may take a friend or family member to notice this. You have chest pain, abdominal pain, shortness of breath, or sweating. Your vision changes or you develop a severe headache. These  symptoms may represent a serious problem that is an emergency. Do not wait to see if the symptoms will go away. Get medical help right away. Call your local emergency services (911 in the [place] Do not drive yourself to the hospital. Summary Dizziness is a feeling of unsteadiness or light-headedness. This condition can be caused by a number of things, including medicines, dehydration, or illness. Anyone can become dizzy, but dizziness is more common in older adults. Drink enough fluid to keep your  urine pale yellow. Do not drink alcohol. Avoid making quick movements if you feel dizzy. Monitor your dizziness for any changes. This information is not intended to replace advice given to you by your health care provider. Make sure you discuss any questions you have with your health care provider. Document Revised: 06/20/2020 Document Reviewed: 06/20/2020 Elsevier Patient Education  2023 [place],   Merri Ray, MD [place] 01/04/22 5:09 PM

## 2022-01-04 NOTE — Patient Instructions (Addendum)
Keep up walking as exercise can help with managing stress.  I do recommend starting sertraline to help with anxiety. Follow up in 6 weeks to see how that is going.   Blood sugar average was at diabetes level in April but barely there and blood sugar was okay at that office visit.  Cut back on sodas and sweets as possible and I think we can probably avoid new meds.  No blood pressure medicine needed for now.  Check your blood pressure outside of the office and if you have readings over 140/90, let me know.  Dizziness could have been related to vertigo, see information below.  Meclizine if needed if the symptoms return, but make sure to drink plenty of fluids and try to avoid more than 1-2 caffeinated drinks per day.  Recheck in 6 weeks, sooner if new or worsening symptoms.  Thank you for coming in today.  Vertigo Vertigo is the feeling that you or your surroundings are moving when they are not. This feeling can come and go at any time. Vertigo often goes away on its own. Vertigo can be dangerous if it occurs while you are doing something that could endanger yourself or others, such as driving or operating machinery. Your health care provider will do tests to try to determine the cause of your vertigo. Tests will also help your health care provider decide how best to treat your condition. Follow these instructions at home: Eating and drinking     Dehydration can make vertigo worse. Drink enough fluid to keep your urine pale yellow. Do not drink alcohol. Activity Return to your normal activities as told by your health care provider. Ask your health care provider what activities are safe for you. In the morning, first sit up on the side of the bed. When you feel okay, stand slowly while you hold onto something until you know that your balance is fine. Move slowly. Avoid sudden body or head movements or certain positions, as told by your health care provider. If you have trouble walking or keeping  your balance, try using a cane for stability. If you feel dizzy or unstable, sit down right away. Avoid doing any tasks that would cause danger to you or others if vertigo occurs. Avoid bending down if you feel dizzy. Place items in your home so that they are easy for you to reach without bending or leaning over. Do not drive or use machinery if you feel dizzy. General instructions Take over-the-counter and prescription medicines only as told by your health care provider. Keep all follow-up visits. This is important. Contact a health care provider if: Your medicines do not relieve your vertigo or they make it worse. Your condition gets worse or you develop new symptoms. You have a fever. You develop nausea or vomiting, or if nausea gets worse. Your family or friends notice any behavioral changes. You have numbness or a prickling and tingling sensation in part of your body. Get help right away if you: Are always dizzy or you faint. Develop severe headaches. Develop a stiff neck. Develop sensitivity to light. Have difficulty moving or speaking. Have weakness in your hands, arms, or legs. Have changes in your hearing or vision. These symptoms may represent a serious problem that is an emergency. Do not wait to see if the symptoms will go away. Get medical help right away. Call your local emergency services (911 in the [place] Do not drive yourself to the hospital. Summary Vertigo is the feeling that  you or your surroundings are moving when they are not. Your health care provider will do tests to try to determine the cause of your vertigo. Follow instructions for home care. You may be told to avoid certain tasks, positions, or movements. Contact a health care provider if your medicines do not relieve your symptoms, or if you have a fever, nausea, vomiting, or changes in behavior. Get help right away if you have severe headaches or difficulty speaking, or you develop hearing or vision  problems. This information is not intended to replace advice given to you by your health care provider. Make sure you discuss any questions you have with your health care provider. Document Revised: 06/15/2020 Document Reviewed: 06/15/2020 Elsevier Patient Education  [place].   Dizziness Dizziness is a common problem. It is a feeling of unsteadiness or light-headedness. You may feel like you are about to faint. Dizziness can lead to injury if you stumble or fall. Anyone can become dizzy, but dizziness is more common in older adults. This condition can be caused by a number of things, including medicines, dehydration, or illness. Follow these instructions at home: Eating and drinking  Drink enough fluid to keep your urine pale yellow. This helps to keep you from becoming dehydrated. Try to drink more clear fluids, such as water. Do not drink alcohol. Limit your caffeine intake if told to do so by your health care provider. Check ingredients and nutrition facts to see if a food or beverage contains caffeine. Limit your salt (sodium) intake if told to do so by your health care provider. Check ingredients and nutrition facts to see if a food or beverage contains sodium. Activity  Avoid making quick movements. Rise slowly from chairs and steady yourself until you feel okay. In the morning, first sit up on the side of the bed. When you feel okay, stand slowly while you hold onto something until you know that your balance is good. If you need to stand in one place for a long time, move your legs often. Tighten and relax the muscles in your legs while you are standing. Do not drive or use machinery if you feel dizzy. Avoid bending down if you feel dizzy. Place items in your home so that they are easy for you to reach without leaning over. Lifestyle Do not use any products that contain nicotine or tobacco. These products include cigarettes, chewing tobacco, and vaping devices, such as  e-cigarettes. If you need help quitting, ask your health care provider. Try to reduce your stress level by using methods such as yoga or meditation. Talk with your health care provider if you need help to manage your stress. General instructions Watch your dizziness for any changes. Take over-the-counter and prescription medicines only as told by your health care provider. Talk with your health care provider if you think that your dizziness is caused by a medicine that you are taking. Tell a friend or a family member that you are feeling dizzy. If he or she notices any changes in your behavior, have this person call your health care provider. Keep all follow-up visits. This is important. Contact a health care provider if: Your dizziness does not go away or you have new symptoms. Your dizziness or light-headedness gets worse. You feel nauseous. You have reduced hearing. You have a fever. You have neck pain or a stiff neck. Your dizziness leads to an injury or a fall. Get help right away if: You vomit or have diarrhea and  are unable to eat or drink anything. You have problems talking, walking, swallowing, or using your arms, hands, or legs. You feel generally weak. You have any bleeding. You are not thinking clearly or you have trouble forming sentences. It may take a friend or family member to notice this. You have chest pain, abdominal pain, shortness of breath, or sweating. Your vision changes or you develop a severe headache. These symptoms may represent a serious problem that is an emergency. Do not wait to see if the symptoms will go away. Get medical help right away. Call your local emergency services (911 in the [place] Do not drive yourself to the hospital. Summary Dizziness is a feeling of unsteadiness or light-headedness. This condition can be caused by a number of things, including medicines, dehydration, or illness. Anyone can become dizzy, but dizziness is more common in older  adults. Drink enough fluid to keep your urine pale yellow. Do not drink alcohol. Avoid making quick movements if you feel dizzy. Monitor your dizziness for any changes. This information is not intended to replace advice given to you by your health care provider. Make sure you discuss any questions you have with your health care provider. Document Revised: 06/20/2020 Document Reviewed: 06/20/2020 Elsevier Patient Education  [place].

## 2022-02-15 ENCOUNTER — Ambulatory Visit (INDEPENDENT_AMBULATORY_CARE_PROVIDER_SITE_OTHER): Payer: Self-pay | Admitting: Family Medicine

## 2022-02-15 VITALS — BP 116/70 | HR 78 | Temp 98.3°F | Resp 20 | Ht 65.0 in | Wt 155.6 lb

## 2022-02-15 DIAGNOSIS — R42 Dizziness and giddiness: Secondary | ICD-10-CM

## 2022-02-15 DIAGNOSIS — Z8679 Personal history of other diseases of the circulatory system: Secondary | ICD-10-CM

## 2022-02-15 DIAGNOSIS — F418 Other specified anxiety disorders: Secondary | ICD-10-CM

## 2022-02-15 MED ORDER — SERTRALINE HCL 25 MG PO TABS: 25.0000 mg | ORAL_TABLET | Freq: Every day | ORAL | 3 refills | Status: DC

## 2022-02-15 NOTE — Patient Instructions (Addendum)
I am glad to hear that the dizziness is improved.  Blood pressure looks stable today off medications.  Continue to stay well-hydrated.  Meclizine if any vertigo or dizziness symptoms return.  Follow-up if those worsen or become more frequent.   Start sertraline once per day for depression and anxiety.  Continue walking and other stress relief strategies.  Recheck in 6 weeks, but if any new or worsening symptoms prior to that time please return to discuss further.  Thank you for coming in today.

## 2022-02-15 NOTE — Progress Notes (Signed)
Subjective:  Patient ID: Brandi Cooke, female    DOB: Jan 01, 1966  Age: 56 y.o. MRN: [id number]  CC:  Chief Complaint  Patient presents with   Hypertension   Dizziness    Has not been having dizzy spells    Anxiety    Pt notes driving with others still causes her anxiety     HPI Brandi Cooke presents for  Follow-up June 8 visit.  Hypertension: History of hypertension but stable off meds.  Discontinued lisinopril last visit. Home readings: none.  No recent BP meds.  BP Readings from Last 3 Encounters:  02/15/22 116/70  01/04/22 124/70  11/16/21 128/72   Lab Results  Component Value Date   CREATININE 0.81 11/16/2021   Anxiety/depression, situational anxiety Discussed previously, some avoidance of crowds, anxiety with driving.  Recommended sertraline.  Had not yet started at her last visit.  Declined counseling.  Coping techniques were discussed.  Did not fill sertraline. Did not know it was there. Still anxious at times.  Trying to plan ahead to minimize rushing.      01/04/2022    3:20 PM 11/16/2021    3:16 PM  GAD 7 : Generalized Anxiety Score  Nervous, Anxious, on Edge 1 2  Control/stop worrying 1 3  Worry too much - different things 1 2  Trouble relaxing 1 1  Restless 1 0  Easily annoyed or irritable 1 1  Afraid - awful might happen 1 2  Total GAD 7 Score 7 11  Anxiety Difficulty Somewhat difficult       02/15/2022    3:28 PM 01/04/2022    3:18 PM 11/16/2021    3:14 PM 11/16/2021    3:10 PM 08/15/2020    1:33 PM  Depression screen PHQ 2/9  Decreased Interest '2 1 1 '$ 0 0  Down, Depressed, Hopeless '2 1 1 '$ 0 0  PHQ - 2 Score '4 2 2 '$ 0 0  Altered sleeping 2 1 0    Tired, decreased energy '2 1 1    '$ Change in appetite 2 0 0    Feeling bad or failure about yourself  2 0 1    Trouble concentrating 2 0 1    Moving slowly or fidgety/restless 2 0 0    Suicidal thoughts 0 0 0    PHQ-9 Score '16 4 5    '$ Difficult doing work/chores  Somewhat difficult       Dizziness Suspected vertigo when discussed at her June visit.  Meclizine prescribed.  Increase hydration and decrease caffeine discussed. Slowing down if feeling like will get dizzy.  Drinking more water. No further episodes like last visit. Did not take meclizine.   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 Cornett, MD;  Location: [place];  Service: General;  Laterality: Left;   BREAST SURGERY     Cyst removed   HYSTEROSCOPY  2002  myomectomy   MYOMECTOMY  10/2005   PELVIC LAPAROSCOPY     Expl. Lap.   REFRACTIVE SURGERY     Allergies  Allergen Reactions   Latex Itching and Rash   Prior to Admission medications   Medication Sig Start Date End Date Taking? Authorizing Provider  albuterol (VENTOLIN HFA) 108 (90 Base) MCG/ACT inhaler 1 to 2 puffs every 4-6 hours as needed for wheezing, shortness of breath.  If 2 puffs, separate by 5 minutes. 09/17/21  Yes [provider]  ibuprofen (ADVIL,MOTRIN) 200 MG tablet Take 400 mg by mouth every 6 (six) hours as needed for headache or moderate pain.   Yes [provider]  meclizine (ANTIVERT) 25 MG tablet Take 1 tablet (25 mg total) by mouth 3 (three) times daily as needed for dizziness. 01/04/22  Yes Wendie Agreste, MD  bimatoprost (LUMIGAN) 0.03 % ophthalmic solution Place 1 drop into both eyes at bedtime. Patient not taking: Reported on 02/15/2022    [provider]  sertraline (ZOLOFT) 25 MG tablet Take 1 tablet  (25 mg total) by mouth daily. Patient not taking: Reported on 02/15/2022 11/16/21   Wendie Agreste, MD   Social History   Socioeconomic History   Marital status: Single    Spouse name: Not on file   Number of children: Not on file   Years of education: Not on file   Highest education level: Not on file  Occupational History   Not on file  Tobacco Use   Smoking status: Every Day    Packs/day: 0.50    Types: Cigarettes   Smokeless tobacco: Never  Vaping Use   Vaping Use: Never used  Substance and Sexual Activity   Alcohol use: Not Currently    Alcohol/week: 5.0 standard drinks of alcohol    Types: 5 Cans of beer per week   Drug use: No   Sexual activity: Not Currently    Comment: 1st intercourse 56 yo-Fewer than 5 partners  Other Topics Concern   Not on file  Social History Narrative   Not on file   Social Determinants of Health   Financial Resource Strain: Not on file  Food Insecurity: Not on file  Transportation Needs: Not on file  Physical Activity: Not on file  Stress: Not on file  Social Connections: Not on file  Intimate Partner Violence: Not on file    Review of Systems Per HPI.   Objective:   Vitals:   02/15/22 1525  BP: 116/70  Pulse: 78  Resp: 20  Temp: 98.3 F (36.8 C)  TempSrc: Oral  SpO2: 98%  Weight: 155 lb 9.6 oz (70.6 kg)  Height: '5\' 5"'$  (1.651 m)     Physical Exam Vitals reviewed.  Constitutional:      Appearance: Normal appearance. She is well-developed.  HENT:     Head: Normocephalic and atraumatic.  Eyes:     Conjunctiva/sclera: Conjunctivae normal.     Pupils: Pupils are equal, round, and reactive to light.  Neck:     Vascular: No carotid bruit.  Cardiovascular:     Rate and Rhythm: Normal rate and regular rhythm.     Heart sounds: Normal heart sounds.  Pulmonary:     Effort: Pulmonary effort is normal.     Breath sounds: Normal breath sounds.  Abdominal:     Palpations: Abdomen is soft. There is no pulsatile mass.      Tenderness: There is no abdominal tenderness.  Musculoskeletal:     Right lower  leg: No edema.     Left lower leg: No edema.  Skin:    General: Skin is warm and dry.  Neurological:     Mental Status: She is alert and oriented to person, place, and time.  Psychiatric:        Mood and Affect: Mood normal.        Behavior: Behavior normal.        Assessment & Plan:  Brandi Cooke is a 56 y.o. female . History of hypertension Blood pressure stable off meds.  Continue to monitor.  Anxiety with depression - Plan: sertraline (ZOLOFT) 25 MG tablet  -Still persistent symptoms as above.  Recommended she start SSRI, discussed sertraline and will resend to pharmacy.  Potential side effects and risk discussed, recheck 6 weeks.  Continue coping techniques.  RTC precautions if new or worsening symptoms.  Dizziness  -Overall improved, continue to stay well-hydrated.  Option of meclizine if vertigo symptoms with RTC precautions given.  Meds ordered this encounter  Medications   sertraline (ZOLOFT) 25 MG tablet    Sig: Take 1 tablet (25 mg total) by mouth daily.    Dispense:  30 tablet    Refill:  3   Patient Instructions  I am glad to hear that the dizziness is improved.  Blood pressure looks stable today off medications.  Continue to stay well-hydrated.  Meclizine if any vertigo or dizziness symptoms return.  Follow-up if those worsen or become more frequent.   Start sertraline once per day for depression and anxiety.  Continue walking and other stress relief strategies.  Recheck in 6 weeks, but if any new or worsening symptoms prior to that time please return to discuss further.  Thank you for coming in today.    Signed,   Merri Ray, MD [place] 02/15/22 4:22 PM

## 2022-05-31 DIAGNOSIS — H401133 Primary open-angle glaucoma, bilateral, severe stage: Secondary | ICD-10-CM | POA: Diagnosis not present

## 2022-07-20 NOTE — Progress Notes (Signed)
56 y.o. G1P1001 Single African American female here for annual exam.    Wants testing for STDs.   Dryness with sex.   Had left eye surgery.   PCP:   Merri Ray, MD  Patient's last menstrual period was 10/06/2014.           Sexually active: No.  The current method of family planning is post menopausal status.    Exercising: No.     Smoker:  yes  Health Maintenance: Pap:  07-06-19 Neg, 06-28-16 Neg, 12-06-14 Neg  History of abnormal Pap:  no MMG:  10/09/21 Breast Density Category B, BI-RADS CATEGORY 1 Negative Colonoscopy:  n/a BMD:   n/a  Result  n/a TDaP:  PCP, unsure Gardasil:   no HIV: 08/29/20 NR Hep C: 08/29/20 Neg Screening Labs:  PCP   reports that she has been smoking cigarettes. She has been smoking an average of .5 packs per day. She has never used smokeless tobacco. She reports that she does not currently use alcohol after a past usage of about 5.0 standard drinks of alcohol per week. She reports that she does not use drugs.  Past Medical History:  Diagnosis Date   Allergy    Anemia    Bell's palsy    Fibroid    Glaucoma    Open   Hypertension    Stroke [facility])    Vertigo     Past Surgical History:  Procedure Laterality Date   BREAST EXCISIONAL BIOPSY Left    BREAST LUMPECTOMY WITH RADIOACTIVE SEED LOCALIZATION Left 01/12/2014   Procedure: LEFT BREAST LUMPECTOMY WITH RADIOACTIVE SEED LOCALIZATION;  Surgeon: Joyice Faster. Cornett, MD;  Location: [place];  Service: General;  Laterality: Left;   BREAST SURGERY     Cyst removed   HYSTEROSCOPY  2002   myomectomy   MYOMECTOMY  10/2005   PELVIC LAPAROSCOPY     Expl. Lap.   REFRACTIVE SURGERY  07/26/2022    Current Outpatient Medications  Medication Sig Dispense Refill   albuterol (VENTOLIN HFA) 108 (90 Base) MCG/ACT inhaler 1 to 2 puffs every 4-6 hours as needed for wheezing, shortness of breath.  If 2 puffs, separate by 5 minutes.     dorzolamide-timolol (COSOPT) 2-0.5 % ophthalmic solution  1 drop 2 (two) times daily.     ibuprofen (ADVIL,MOTRIN) 200 MG tablet Take 400 mg by mouth every 6 (six) hours as needed for headache or moderate pain.     latanoprost (XALATAN) 0.005 % ophthalmic solution 1 drop at bedtime.     meclizine (ANTIVERT) 25 MG tablet Take 1 tablet (25 mg total) by mouth 3 (three) times daily as needed for dizziness. 30 tablet 0   ofloxacin (OCUFLOX) 0.3 % ophthalmic solution Place 1 drop into the left eye 4 (four) times daily.     prednisoLONE acetate (PRED FORTE) 1 % ophthalmic suspension Place 1 drop into the left eye 4 (four) times daily.     sertraline (ZOLOFT) 25 MG tablet Take 1 tablet (25 mg total) by mouth daily. 30 tablet 3   bimatoprost (LUMIGAN) 0.03 % ophthalmic solution Place 1 drop into both eyes at bedtime. (Patient not taking: Reported on 08/02/2022)     No current facility-administered medications for this visit.    Family History  Problem Relation Age of Onset   Hypertension Mother    Diabetes Mother    Stroke Brother    Migraines Brother    Cancer Maternal Uncle        Unknown  type   Migraines Brother    Heart disease Brother    Thyroid disease Brother     Review of Systems  All other systems reviewed and are negative.   Exam:   BP 126/78 (BP Location: Right Arm, Patient Position: Sitting, Cuff Size: Normal)   Ht '5\' 5"'$  (1.651 m)   Wt 163 lb (73.9 kg)   LMP 10/06/2014   BMI 27.12 kg/m     General appearance: alert, cooperative and appears stated age Head: normocephalic, without obvious abnormality, atraumatic Neck: no adenopathy, supple, symmetrical, trachea midline and thyroid normal to inspection and palpation Lungs: clear to auscultation bilaterally Breasts: normal appearance, no masses or tenderness, No nipple retraction or dimpling, No nipple discharge or bleeding, No axillary adenopathy Heart: regular rate and rhythm Abdomen: soft, non-tender; no masses, no organomegaly Extremities: extremities normal, atraumatic, no  cyanosis or edema Skin: skin color, texture, turgor normal. No rashes or lesions Lymph nodes: cervical, supraclavicular, and axillary nodes normal. Neurologic: grossly normal  Pelvic: External genitalia:  no lesions              No abnormal inguinal nodes palpated.              Urethra:  normal appearing urethra with no masses, tenderness or lesions              Bartholins and Skenes: normal                 Vagina: normal appearing vagina with normal color and discharge, no lesions              Cervix: no lesions              Pap taken: yes Bimanual Exam:  Uterus:  normal size, contour, position, consistency, mobility, non-tender              Adnexa: no mass, fullness, tenderness              Rectal exam: yes.  Confirms.              Anus:  normal sphincter tone, no lesions  Chaperone was present for exam:  Santiago Glad  Assessment:   Well woman visit with gynecologic exam. Cervical cancer screening.  STD screening.  Vaginal atrophy. Hx left breast biopsy.  Colon cancer screening.  HTN.  Hx stroke.  Tobacco use.   Plan: Mammogram screening discussed.  She will schedule for March. Self breast awareness reviewed. Pap and HR HPV as above. Lubricants discussed:  water based and cooking oils.  Guidelines for Calcium, Vitamin D, regular exercise program including cardiovascular and weight bearing exercise. STD screening.  Referral for colonoscopy.  Routine labs with PCP.  Follow up annually and prn.   After visit summary provided.

## 2022-07-24 ENCOUNTER — Ambulatory Visit: Payer: Medicaid Other | Admitting: Obstetrics and Gynecology

## 2022-07-26 DIAGNOSIS — H401123 Primary open-angle glaucoma, left eye, severe stage: Secondary | ICD-10-CM | POA: Diagnosis not present

## 2022-07-26 HISTORY — PX: REFRACTIVE SURGERY: SHX103

## 2022-07-26 HISTORY — PX: GLAUCOMA VALVE INSERTION: SHX5297

## 2022-07-30 DIAGNOSIS — D3A8 Other benign neuroendocrine tumors: Secondary | ICD-10-CM | POA: Insufficient documentation

## 2022-07-30 HISTORY — DX: Other benign neuroendocrine tumors: D3A.8

## 2022-08-02 ENCOUNTER — Encounter: Payer: Self-pay | Admitting: Obstetrics and Gynecology

## 2022-08-02 ENCOUNTER — Other Ambulatory Visit (HOSPITAL_COMMUNITY)
Admission: RE | Admit: 2022-08-02 | Discharge: 2022-08-02 | Disposition: A | Discharge status: Home / Self Care | Payer: Medicaid Other | Source: Ambulatory Visit | Attending: Obstetrics and Gynecology | Admitting: Obstetrics and Gynecology

## 2022-08-02 ENCOUNTER — Ambulatory Visit (INDEPENDENT_AMBULATORY_CARE_PROVIDER_SITE_OTHER): Payer: Medicaid Other | Admitting: Obstetrics and Gynecology

## 2022-08-02 VITALS — BP 126/78 | Ht 65.0 in | Wt 163.0 lb

## 2022-08-02 DIAGNOSIS — Z01419 Encounter for gynecological examination (general) (routine) without abnormal findings: Secondary | ICD-10-CM

## 2022-08-02 DIAGNOSIS — Z114 Encounter for screening for human immunodeficiency virus [HIV]: Secondary | ICD-10-CM | POA: Diagnosis not present

## 2022-08-02 DIAGNOSIS — Z113 Encounter for screening for infections with a predominantly sexual mode of transmission: Secondary | ICD-10-CM | POA: Insufficient documentation

## 2022-08-02 DIAGNOSIS — Z124 Encounter for screening for malignant neoplasm of cervix: Secondary | ICD-10-CM | POA: Diagnosis not present

## 2022-08-02 DIAGNOSIS — Z1159 Encounter for screening for other viral diseases: Secondary | ICD-10-CM

## 2022-08-02 DIAGNOSIS — Z1211 Encounter for screening for malignant neoplasm of colon: Secondary | ICD-10-CM

## 2022-08-02 NOTE — Patient Instructions (Signed)

## 2022-08-03 LAB — RPR: RPR Ser Ql: NONREACTIVE

## 2022-08-03 LAB — HEPATITIS C ANTIBODY: Hepatitis C Ab: NONREACTIVE

## 2022-08-03 LAB — HIV ANTIBODY (ROUTINE TESTING W REFLEX): HIV 1&2 Ab, 4th Generation: NONREACTIVE

## 2022-08-07 LAB — CYTOLOGY - PAP
Chlamydia: NEGATIVE
Comment: NEGATIVE
Comment: NEGATIVE
Comment: NEGATIVE
Comment: NORMAL
Diagnosis: NEGATIVE
High risk HPV: NEGATIVE
Neisseria Gonorrhea: NEGATIVE
Trichomonas: NEGATIVE

## 2022-08-20 ENCOUNTER — Encounter: Payer: Self-pay | Admitting: Gastroenterology

## 2022-09-10 ENCOUNTER — Ambulatory Visit (AMBULATORY_SURGERY_CENTER): Payer: Medicaid Other

## 2022-09-10 VITALS — Ht 67.0 in | Wt 160.0 lb

## 2022-09-10 DIAGNOSIS — Z1211 Encounter for screening for malignant neoplasm of colon: Secondary | ICD-10-CM

## 2022-09-10 MED ORDER — PLENVU 140 G PO SOLR: 1.0000 | ORAL | Status: DC

## 2022-09-10 NOTE — Progress Notes (Signed)
No egg or soy allergy known to patient  No issues known to pt with past sedation with any surgeries or procedures Patient denies ever being told they had issues or difficulty with intubation  No FH of Malignant Hyperthermia Pt is not on diet pills Pt is not on  home 02  Pt is not on blood thinners  Pt denies issues with constipation  No A fib or A flutter Have any cardiac testing pending--no Pt instructed to use [email or web address] or GoodRx for a price reduction on prep   

## 2022-09-10 NOTE — Addendum Note (Signed)
Addended by: Richarda Blade on: 09/10/2022 11:02 AM   Modules accepted: Orders

## 2022-09-15 ENCOUNTER — Emergency Department (HOSPITAL_COMMUNITY)
Admission: EM | Admit: 2022-09-15 | Discharge: 2022-09-15 | Disposition: A | Discharge status: Home / Self Care | Payer: Medicaid Other | Attending: Emergency Medicine | Admitting: Emergency Medicine

## 2022-09-15 ENCOUNTER — Encounter (HOSPITAL_COMMUNITY): Payer: Self-pay

## 2022-09-15 DIAGNOSIS — Z1152 Encounter for screening for COVID-19: Secondary | ICD-10-CM | POA: Insufficient documentation

## 2022-09-15 DIAGNOSIS — Z113 Encounter for screening for infections with a predominantly sexual mode of transmission: Secondary | ICD-10-CM | POA: Diagnosis not present

## 2022-09-15 DIAGNOSIS — B9789 Other viral agents as the cause of diseases classified elsewhere: Secondary | ICD-10-CM | POA: Diagnosis not present

## 2022-09-15 DIAGNOSIS — Z91040 Latex allergy status: Secondary | ICD-10-CM | POA: Insufficient documentation

## 2022-09-15 DIAGNOSIS — J069 Acute upper respiratory infection, unspecified: Secondary | ICD-10-CM

## 2022-09-15 DIAGNOSIS — R0981 Nasal congestion: Secondary | ICD-10-CM | POA: Diagnosis present

## 2022-09-15 LAB — URINALYSIS, ROUTINE W REFLEX MICROSCOPIC
Bilirubin Urine: NEGATIVE
Glucose, UA: NEGATIVE mg/dL
Hgb urine dipstick: NEGATIVE
Ketones, ur: NEGATIVE mg/dL
Nitrite: NEGATIVE
Protein, ur: NEGATIVE mg/dL
Specific Gravity, Urine: 1.012 (ref 1.005–1.030)
pH: 5 (ref 5.0–8.0)

## 2022-09-15 LAB — RESP PANEL BY RT-PCR (RSV, FLU A&B, COVID)  RVPGX2
Influenza A by PCR: NEGATIVE
Influenza B by PCR: NEGATIVE
Resp Syncytial Virus by PCR: NEGATIVE
SARS Coronavirus 2 by RT PCR: NEGATIVE

## 2022-09-15 LAB — WET PREP, GENITAL
Clue Cells Wet Prep HPF POC: NONE SEEN
Sperm: NONE SEEN
Trich, Wet Prep: NONE SEEN
WBC, Wet Prep HPF POC: >=10 — AB (ref <10)
Yeast Wet Prep HPF POC: NONE SEEN

## 2022-09-15 MED ORDER — ALBUTEROL SULFATE HFA 108 (90 BASE) MCG/ACT IN AERS
2.0000 | INHALATION_SPRAY | Freq: Once | RESPIRATORY_TRACT | Status: AC
  Administered 2022-09-15: 2 via RESPIRATORY_TRACT
  Filled 2022-09-15: qty 6.7

## 2022-09-15 NOTE — ED Provider Notes (Signed)
[place] EMERGENCY DEPARTMENT AT [facility] Provider Note   CSN: [id number] Arrival date & time: 09/15/22  0845     History  Chief Complaint  Patient presents with   Female GU Problem   Nasal Congestion    Bridgitt GWENNETTE CAMARATA is a 57 y.o. female.  HPI She is sexually active with only 1 partner.  She describes this as a long-term intermittent relationship.  He reports her partner reported experiencing "tingling" of his penis while they are having intercourse and based on his reading on the subject, told her that she had a UTI.  The patient describes a repetitive encounters whereby her partner claims having a certain type of discomfort and then pulling out from intercourse stating that she has some kind of an infection, most likely a urinary tract infection.  Apparently this is delivered in a way that is making the patient feel very insecure and accused.  Patient denies she is having any abnormal vaginal burning or discharge.  She reports she goes to her gynecologist at least annually to get all of her routine GYN care.  She reports she was seen about a month ago and there were no problems identified.  In addition to the symptoms patient describes some URI symptoms.  She reports that she had worse symptoms about a week ago with increased amount of cough and aches.  She reports now it she mostly just has some nasal congestion.  She was concerned she might have COVID and wanted to be tested.    Home Medications Prior to Admission medications   Medication Sig Start Date End Date Taking? Authorizing Provider  albuterol (VENTOLIN HFA) 108 (90 Base) MCG/ACT inhaler 1 to 2 puffs every 4-6 hours as needed for wheezing, shortness of breath.  If 2 puffs, separate by 5 minutes. 09/17/21   [provider]  bimatoprost (LUMIGAN) 0.03 % ophthalmic solution Place 1 drop into both eyes at bedtime.    [provider]  dorzolamide-timolol (COSOPT) 2-0.5 % ophthalmic solution 1  drop 2 (two) times daily. Patient not taking: Reported on 09/10/2022 07/14/22   [provider]  ibuprofen (ADVIL,MOTRIN) 200 MG tablet Take 400 mg by mouth every 6 (six) hours as needed for headache or moderate pain. Patient not taking: Reported on 09/10/2022    [provider]  latanoprost (XALATAN) 0.005 % ophthalmic solution 1 drop at bedtime. Patient not taking: Reported on 09/10/2022 07/14/22   [provider]  meclizine (ANTIVERT) 25 MG tablet Take 1 tablet (25 mg total) by mouth 3 (three) times daily as needed for dizziness. Patient not taking: Reported on 09/10/2022 01/04/22   Wendie Agreste, MD  ofloxacin (OCUFLOX) 0.3 % ophthalmic solution Place 1 drop into the left eye 4 (four) times daily. Patient not taking: Reported on 09/10/2022 06/08/22   [provider]  PEG-KCl-NaCl-NaSulf-Na Asc-C (PLENVU) 140 g SOLR Take 1 kit by mouth as directed. No PA's 09/10/22   Thornton Park, MD  prednisoLONE acetate (PRED FORTE) 1 % ophthalmic suspension Place 1 drop into the left eye 4 (four) times daily. 07/14/22   [provider]  sertraline (ZOLOFT) 25 MG tablet Take 1 tablet (25 mg total) by mouth daily. Patient not taking: Reported on 09/10/2022 02/15/22   Wendie Agreste, MD      Allergies    Latex    Review of Systems   Review of Systems  Physical Exam Updated Vital Signs BP (!) 149/97 (BP Location: Left Arm)   Pulse  88   Temp 97.9 F (36.6 C) (Oral)   Resp 18   LMP 10/06/2014   SpO2 100%  Physical Exam Constitutional:      Appearance: Normal appearance.  HENT:     Mouth/Throat:     Mouth: Mucous membranes are moist.     Pharynx: Oropharynx is clear.  Eyes:     Extraocular Movements: Extraocular movements intact.     Comments: Patient has a little bit of tearing and drainage from the left eye.  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  She reports it is healing and this is not a new or worsening condition.   Cardiovascular:     Rate and Rhythm: Normal rate and regular rhythm.  Pulmonary:     Effort: Pulmonary effort is normal.     Breath sounds: Normal breath sounds.  Abdominal:     General: There is no distension.     Palpations: Abdomen is soft.     Tenderness: There is no abdominal tenderness. There is no guarding.  Genitourinary:    Comments: Normal external female genitalia.  No lesions or ulcerations.  No drainage from the vault.  Speculum exam, cervix is clear without drainage discharge or erythema.  Cervix is nonfriable.  No yeast or adherent material to the vaginal walls.  Bimanual exam nontender Musculoskeletal:        General: Normal range of motion.     Cervical back: Neck supple.     Comments: No peripheral edema.  Lower legs are in good condition.  Skin:    General: Skin is warm and dry.  Neurological:     General: No focal deficit present.     Mental Status: She is alert and oriented to person, place, and time.     Motor: No weakness.     Coordination: Coordination normal.  Psychiatric:        Mood and Affect: Mood normal.     ED Results / Procedures / Treatments   Labs (all labs ordered are listed, but only abnormal results are displayed) Labs Reviewed  WET PREP, GENITAL - Abnormal; Notable for the following components:      Result Value   WBC, Wet Prep HPF POC >=10 (*)    All other components within normal limits  URINALYSIS, ROUTINE W REFLEX MICROSCOPIC - Abnormal; Notable for the following components:   APPearance HAZY (*)    Leukocytes,Ua LARGE (*)    Bacteria, UA RARE (*)    All other components within normal limits  RESP PANEL BY RT-PCR (RSV, FLU A&B, COVID)  RVPGX2  RPR  GC/CHLAMYDIA PROBE AMP ([place]) NOT AT [facility]    EKG None  Radiology No results found.  Procedures Procedures    Medications Ordered in ED Medications - No data to display  ED Course/ Medical Decision Making/ A&P                             Medical Decision  Making Amount and/or Complexity of Data Reviewed Labs: ordered.   Patient's main apparent concern is her partners assertion that she "has a UTI" or possibly some other vaginal infection or problem.  Patient describes having had 1 long-term partner with whom she is sexually active sporadically.  She is uncertain if he may have intercourse with other partners.  Patient is not having symptomatic vaginal discharge drainage or pelvic pain.  Physical exam is normal pelvic exam without any  concerning changes to suggest cervicitis or vaginitis.  Urinalysis negative, wet prep negative.  COVID and influenza testing negative.  This time with unremarkable pelvic exam and negative urinalysis and wet prep, I do not think the patient needs empiric antibiotic therapy.  We discussed pending GC chlamydia results to be followed up on.  Discussed that her partner should be evaluated if he is experiencing some atypical symptoms.  We also reviewed recommendations for vaginal lubricants to be limited to coconut oil and specially designed lubricant such as K-Y jelly.  This has been reviewed by her gynecologist previously and she voices understanding.  Patient previously had URI symptoms that are nearly resolved at this point.  COVID and influenza testing are negative.  No further treatment needed at this time.  Patient is discharged in good condition.        Final Clinical Impression(s) / ED Diagnoses Final diagnoses:  Viral upper respiratory tract infection  Encntr screen for infections w sexl mode of transmiss    Rx / DC Orders ED Discharge Orders     None         Charlesetta Shanks, MD 09/15/22 1048

## 2022-09-15 NOTE — Discharge Instructions (Signed)
1.  Your testing in the emergency department did not show signs of urinary tract infection.  It also did not show signs of a sexually transmitted disease.  Your gonorrhea and Chlamydia tests are still being processed.  Follow-up with these in [place].  You will be called if your results are positive.  You are not being treated until your results return because your examination does not suggest infection. 2.  You and your partner should only use lubricating jelly specifically made for sexual intercourse such as K-Y jelly or coconut oil.  Do not use Vaseline or other types of lotions.  These will cause irritation. 3.  Follow-up with your gynecologist as needed. 4.  If your partner is having signs or symptoms of infection such as burning or tingling or discomfort with intercourse, he should be evaluated by a physician.

## 2022-09-15 NOTE — ED Triage Notes (Signed)
Pt arrived via POV, c/o nasal congestion, requesting to be tested for COVID. Also c/o back pain, urinary frequency and vaginal dryness. Requesting UTI testing and vaginal exam. Denies any pain or discharge.

## 2022-09-16 LAB — RPR: RPR Ser Ql: NONREACTIVE

## 2022-09-17 ENCOUNTER — Telehealth: Payer: Self-pay

## 2022-09-17 LAB — GC/CHLAMYDIA PROBE AMP (~~LOC~~) NOT AT ARMC
Chlamydia: NEGATIVE
Comment: NEGATIVE
Comment: NORMAL
Neisseria Gonorrhea: NEGATIVE

## 2022-09-17 NOTE — Transitions of Care (Post Inpatient/ED Visit) (Signed)
   09/17/2022  Name: Brandi Cooke MRN: [id number] DOB: 06-07-1966  Today's TOC FU Call Status: Today's TOC FU Call Status:: Unsuccessul Call (1st Attempt) Unsuccessful Call (1st Attempt) Date: 09/17/22  Attempted to reach the patient regarding the most recent Inpatient/ED visit.  Follow Up Plan: Additional outreach attempts will be made to reach the patient to complete the Transitions of Care (Post Inpatient/ED visit) call.     Enzo Montgomery, RN,BSN,CCM [place] [facility] Telephonic Care Management Coordinator Direct Phone: [phone] Toll Free: [phone] Fax: [phone]

## 2022-09-18 ENCOUNTER — Telehealth: Payer: Self-pay

## 2022-09-18 NOTE — Transitions of Care (Post Inpatient/ED Visit) (Signed)
   09/18/2022  Name: Brandi Cooke MRN: [id number] DOB: 10-27-1965  Today's TOC FU Call Status: Today's TOC FU Call Status:: Successful TOC FU Call Competed TOC FU Call Complete Date: 09/18/22  Transition Care Management Follow-up Telephone Call Date of Discharge: 09/16/22 Discharge Facility: [facility]) Type of Discharge: Emergency Department Reason for ED Visit: Other: ("viral resp. tract infection") How have you been since you were released from the hospital?: Better (patient states she is feeling mcuh better-no SOB or coughing.) Any questions or concerns?: No (Red on EMMI-ED Discharge Alert Date & Reason:09/16/22-"Scheduled follow-up appt? No")  Items Reviewed: Did you receive and understand the discharge instructions provided?: Yes Medications obtained and verified?: Yes (Medications Reviewed) Any new allergies since your discharge?: No Dietary orders reviewed?: NA Do you have support at home?: No  Home Care and Equipment/Supplies: [place] Ordered?: No Any new equipment or medical supplies ordered?: No  Functional Questionnaire: Do you need assistance with bathing/showering or dressing?: No Do you need assistance with meal preparation?: No Do you need assistance with eating?: No Do you have difficulty maintaining continence: No Do you need assistance with getting out of bed/getting out of a chair/moving?: No Do you have difficulty managing or taking your medications?: No  Folllow up appointments reviewed: PCP Follow-up appointment confirmed?: No (patient states she was not advised to follow up with PCP(not noted on d/c instructions). She does not feel like it is neccesary since she is feeling better.) [facility] Follow-up appointment confirmed?: NA Do you need transportation to your follow-up appointment?: No Do you understand care options if your condition(s) worsen?: Yes-patient verbalized understanding  SDOH Interventions Today     Flowsheet Row Most Recent Value  SDOH Interventions   Food Insecurity Interventions Intervention Not Indicated      TOC Interventions Today    Flowsheet Row Most Recent Value  TOC Interventions   TOC Interventions Discussed/Reviewed TOC Interventions Discussed      Interventions Today    Flowsheet Row Most Recent Value  Education Interventions   Education Provided Provided Education  [resp mgmt]  Provided Verbal Education On When to see the doctor       Enzo Montgomery, RN,BSN,CCM [place] [facility] Telephonic Care Management Coordinator Direct Phone: [phone] Toll Free: [phone] Fax: [phone]

## 2022-09-28 DIAGNOSIS — Z85048 Personal history of other malignant neoplasm of rectum, rectosigmoid junction, and anus: Secondary | ICD-10-CM

## 2022-09-28 HISTORY — DX: Personal history of other malignant neoplasm of rectum, rectosigmoid junction, and anus: Z85.048

## 2022-10-03 ENCOUNTER — Telehealth: Payer: Self-pay | Admitting: Gastroenterology

## 2022-10-03 ENCOUNTER — Telehealth: Payer: Self-pay | Admitting: Family Medicine

## 2022-10-03 DIAGNOSIS — Z0279 Encounter for issue of other medical certificate: Secondary | ICD-10-CM

## 2022-10-03 DIAGNOSIS — Z1211 Encounter for screening for malignant neoplasm of colon: Secondary | ICD-10-CM

## 2022-10-03 MED ORDER — PLENVU 140 G PO SOLR: ORAL | 0 refills | Status: DC

## 2022-10-03 NOTE — Telephone Encounter (Signed)
Pt will need an appointment to complete this has not been seen in 8 months

## 2022-10-03 NOTE — Telephone Encounter (Signed)
Prior to calling pt. Called pharmacy pt.has on file to see if they have plenvu in stock,they do not have but stated that it shows they have in stock at [facility] [address]made pt. Aware  not in stock, at [facility] on file pt.  informed where  they have it in stock,she requested that I call [facility] on [address] to see if they have it in stock and [facility] do not have in stock,pt.made aware that [facility] doesn't have it,pt.instructed me  to send plenvu rx to [facility] [address],encouraged to try and pick up today so that if there are any other issues we will have time to address any other prep issues,pt.stated "I will try".

## 2022-10-03 NOTE — Telephone Encounter (Signed)
Inbound call from patient, states her procedure is on 3/8. She states she went to [facility] and they stated they had not received her prep medication. Stated she needs that sent being that she has to start taking it tomorrow.

## 2022-10-03 NOTE — Telephone Encounter (Signed)
Disability Forms in front bin.

## 2022-10-04 ENCOUNTER — Encounter: Payer: Self-pay | Admitting: Family Medicine

## 2022-10-04 ENCOUNTER — Ambulatory Visit (INDEPENDENT_AMBULATORY_CARE_PROVIDER_SITE_OTHER): Payer: Medicaid Other | Admitting: Family Medicine

## 2022-10-04 VITALS — BP 128/74 | HR 87 | Temp 97.8°F | Ht 67.0 in | Wt 169.2 lb

## 2022-10-04 DIAGNOSIS — R7309 Other abnormal glucose: Secondary | ICD-10-CM

## 2022-10-04 DIAGNOSIS — R42 Dizziness and giddiness: Secondary | ICD-10-CM

## 2022-10-04 DIAGNOSIS — R079 Chest pain, unspecified: Secondary | ICD-10-CM

## 2022-10-04 DIAGNOSIS — F418 Other specified anxiety disorders: Secondary | ICD-10-CM | POA: Diagnosis not present

## 2022-10-04 DIAGNOSIS — E7800 Pure hypercholesterolemia, unspecified: Secondary | ICD-10-CM | POA: Diagnosis not present

## 2022-10-04 DIAGNOSIS — Z8669 Personal history of other diseases of the nervous system and sense organs: Secondary | ICD-10-CM

## 2022-10-04 DIAGNOSIS — Z8679 Personal history of other diseases of the circulatory system: Secondary | ICD-10-CM

## 2022-10-04 LAB — CBC WITH DIFFERENTIAL/PLATELET
Basophils Absolute: 0 10*3/uL (ref 0.0–0.1)
Basophils Relative: 0.5 % (ref 0.0–3.0)
Eosinophils Absolute: 0.1 10*3/uL (ref 0.0–0.7)
Eosinophils Relative: 2.5 % (ref 0.0–5.0)
HCT: 44.1 % (ref 36.0–46.0)
Hemoglobin: 14.6 g/dL (ref 12.0–15.0)
Lymphocytes Relative: 40.3 % (ref 12.0–46.0)
Lymphs Abs: 1.8 10*3/uL (ref 0.7–4.0)
MCHC: 33.1 g/dL (ref 30.0–36.0)
MCV: 89.5 fl (ref 78.0–100.0)
Monocytes Absolute: 0.4 10*3/uL (ref 0.1–1.0)
Monocytes Relative: 7.9 % (ref 3.0–12.0)
Neutro Abs: 2.2 10*3/uL (ref 1.4–7.7)
Neutrophils Relative %: 48.8 % (ref 43.0–77.0)
Platelets: 282 10*3/uL (ref 150.0–400.0)
RBC: 4.92 Mil/uL (ref 3.87–5.11)
RDW: 14.3 % (ref 11.5–15.5)
WBC: 4.5 10*3/uL (ref 4.0–10.5)

## 2022-10-04 LAB — HEMOGLOBIN A1C: Hgb A1c MFr Bld: 7.5 % — ABNORMAL HIGH (ref 4.6–6.5)

## 2022-10-04 MED ORDER — SERTRALINE HCL 25 MG PO TABS: 25.0000 mg | ORAL_TABLET | Freq: Every day | ORAL | 3 refills | Status: DC

## 2022-10-04 NOTE — Progress Notes (Signed)
Subjective:  Patient ID: Brandi Cooke, female    DOB: 11/16/1965  Age: 57 y.o. MRN: [id number]  CC:  Chief Complaint  Patient presents with   Form Completion    Pt notes had eye surgery     HPI Brandi Cooke presents for    Hypertension: History of hypertension, last discussed in July.  Blood pressure stable off meds at that time.  And previously taking lisinopril.  Elevated reading noted in February, no recent meds. Home readings: none.  BP Readings from Last 3 Encounters:  10/04/22 128/74  09/15/22 (!) 149/97  08/02/22 126/78   Lab Results  Component Value Date   CREATININE 0.81 11/16/2021    Diabetes: No current meds. A1c at diabetic level last year. No prior meds for diabetes. No home readings.  Lab Results  Component Value Date   HGBA1C 6.8 (H) 11/16/2021   Lab Results  Component Value Date   LDLCALC 113 (H) 11/16/2021   CREATININE 0.81 11/16/2021    Anxiety, situational anxiety Discussed last July.  Previously had recommended starting sertraline, counseling had been declined previously.  Coping techniques were discussed.  She ended up not feeling sertraline, still anxious at times at her July visit.  With persistent symptoms recommended she started sertraline 25 mg and follow-up in 6 weeks.  Last visit with me in July 2023.denies barriers to  care. Still feeling anxious multiple times per day - feels stressed, overwhelmed, anxious few times per day. Notes dizziness at those times. Taking sertraline as needed only - not daily - feels a little better when taking. Only taking 2 times per week. No side effects on meds.  No current therapist. Declines meeting with therapist. Agrees to try daily sertraline.    Elevated LDL of 113 in 10/2011 no meds.   Episode of dizziness Discussed possible vertigo at her June 2023 visit.  Meclizine was initially prescribed.  Also discussed increased hydration and decrease caffeine intake.  She was drinking more water at her  July visit with no further episodes and did not require meclizine.  Did discuss option of meclizine if vertiginous symptoms returned and to follow-up if those symptoms worsen or became more frequent. Has had some episodes off and on - notices when rushing or upset or when feeling upset. Feels lightheaded or dizzy at times if overwhelmed. Occurs about every other day or every other day. Improves with rest or sitting down. No room spinning sensation. Not taking meclizine.   No dark stools. Colonoscopy tomorrow. Sometimes feels chest pain in evening  sometimes, not with dizziness or lightheadedness. Improves in few seconds, no associated diaphoresis, or dyspnea.  No palpitations.   Lab Results  Component Value Date   WBC 5.2 11/16/2021   HGB 14.1 11/16/2021   HCT 42.2 11/16/2021   MCV 89.6 11/16/2021   PLT 243.0 11/16/2021     Glaucoma Followed by Dr. Midge Aver. Treated for glaucoma, minimal vision in left eye d/t glaucoma. Had surgery December 28th. Had loss of vision in left eye for years. Still healing from surgery from left eye. Chronic vision loss - darkness in R eye.  Some vision to see out of left eye - window of vision.   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 Cornett, MD;  Location: [facility];  Service: General;  Laterality: Left;   BREAST SURGERY     Cyst removed   HYSTEROSCOPY  2002   myomectomy   MYOMECTOMY  10/2005   PELVIC LAPAROSCOPY     Expl. Lap.    REFRACTIVE SURGERY  07/26/2022   Allergies  Allergen Reactions   Latex Itching and Rash   Prior to Admission medications   Medication Sig Start Date End Date Taking? Authorizing Provider  albuterol (VENTOLIN HFA) 108 (90 Base) MCG/ACT inhaler 1 to 2 puffs every 4-6 hours as needed for wheezing, shortness of breath.  If 2 puffs, separate by 5 minutes. 09/17/21   [provider]  bimatoprost (LUMIGAN) 0.03 % ophthalmic solution Place 1 drop into both eyes at bedtime.    [provider]  dorzolamide-timolol (COSOPT) 2-0.5 % ophthalmic solution 1 drop 2 (two) times daily. Patient not taking: Reported on 09/10/2022 07/14/22   [provider]  ibuprofen (ADVIL,MOTRIN) 200 MG tablet Take 400 mg by mouth every 6 (six) hours as needed for headache or moderate pain. Patient not taking: Reported on 09/10/2022    [provider]  latanoprost (XALATAN) 0.005 % ophthalmic solution 1 drop at bedtime. Patient not taking: Reported on 09/10/2022 07/14/22   [provider]  meclizine (ANTIVERT) 25 MG tablet Take 1 tablet (25 mg total) by mouth 3 (three) times daily as needed for dizziness. Patient not taking: Reported on 09/10/2022 01/04/22   Wendie Agreste, MD  ofloxacin (OCUFLOX) 0.3 % ophthalmic solution Place 1 drop into the left eye 4 (four) times daily. Patient not taking: Reported on 09/10/2022 06/08/22   [provider]  PEG-KCl-NaCl-NaSulf-Na Asc-C (PLENVU) 140 g SOLR Dispense one kit,use as directed 10/03/22   Thornton Park, MD  prednisoLONE acetate (PRED FORTE) 1 % ophthalmic suspension Place 1 drop into the left eye 4 (four) times daily. 07/14/22   [provider]  sertraline (ZOLOFT) 25 MG tablet Take 1 tablet (25 mg total) by mouth daily. Patient not taking: Reported on 09/10/2022 02/15/22   Wendie Agreste, MD   Social History   Socioeconomic History   Marital status: Single    Spouse name: Not on file   Number of children: Not on  file   Years of education: Not on file   Highest education level: Not on file  Occupational History   Not on file  Tobacco Use   Smoking status: Every Day    Packs/day: 0.50    Types: Cigarettes   Smokeless tobacco: Never  Vaping Use   Vaping Use: Never used  Substance and Sexual Activity   Alcohol use: Not Currently    Alcohol/week: 5.0 standard drinks of alcohol    Types: 5 Cans of beer per week   Drug use: No   Sexual activity: Not Currently    Comment: 1st intercourse 57 yo-Fewer than 5 partners  Other Topics Concern   Not on file  Social History Narrative   Not on file   Social Determinants of Health   Financial Resource Strain: Not on file  Food Insecurity:  No Food Insecurity (09/18/2022)   Hunger Vital Sign    Worried About Running Out of Food in the Last Year: Never true    Ran Out of Food in the Last Year: Never true  Transportation Needs: No Transportation Needs (09/18/2022)   PRAPARE - Hydrologist (Medical): No    Lack of Transportation (Non-Medical): No  Physical Activity: Not on file  Stress: Not on file  Social Connections: Not on file  Intimate Partner Violence: Not on file    Review of Systems Per hpi.   Objective:   Vitals:   10/04/22 1332  BP: 128/74  Pulse: 87  Temp: 97.8 F (36.6 C)  TempSrc: Temporal  SpO2: 98%  Weight: 169 lb 3.2 oz (76.7 kg)  Height: '5\' 7"'$  (1.702 m)     Physical Exam Vitals reviewed.  Constitutional:      Appearance: Normal appearance. She is well-developed.  HENT:     Head: Normocephalic and atraumatic.  Eyes:     Conjunctiva/sclera: Conjunctivae normal.     Pupils: Pupils are equal, round, and reactive to light.  Neck:     Vascular: No carotid bruit.  Cardiovascular:     Rate and Rhythm: Normal rate and regular rhythm.     Heart sounds: Normal heart sounds.  Pulmonary:     Effort: Pulmonary effort is normal.     Breath sounds: Normal breath sounds.  Abdominal:      Palpations: Abdomen is soft. There is no pulsatile mass.     Tenderness: There is no abdominal tenderness.  Musculoskeletal:     Right lower leg: No edema.     Left lower leg: No edema.  Skin:    General: Skin is warm and dry.  Neurological:     General: No focal deficit present.     Mental Status: She is alert and oriented to person, place, and time.     GCS: GCS eye subscore is 4. GCS verbal subscore is 5. GCS motor subscore is 6.     Cranial Nerves: No facial asymmetry.     Motor: No weakness or pronator drift.     Coordination: Romberg sign negative. Coordination normal. Finger-Nose-Finger Test and Heel to [facility] Test normal.     Gait: Gait is intact.  Psychiatric:        Mood and Affect: Mood normal.        Behavior: Behavior normal.    EKG, sinus rhythm, rate 74.  No apparent acute ST or T wave changes, nonspecific T wave in lead III, no prior EKG available for review.  55 minutes spent during visit, including chart review, review of previous plan for anxiety, dizziness, discussion of dizziness, additional symptoms as above.  Counseling and assimilation of information, exam, discussion of plan, and chart completion.  Time exclusive of evaluation/interpretation of EKG above.  Assessment & Plan:  Brandi Cooke is a 57 y.o. female . Intermittent lightheadedness - Plan: EKG 12-Lead Dizziness - Plan: EKG 12-Lead, CBC with Differential/Platelet Anxiety with depression - Plan: sertraline (ZOLOFT) 25 MG tablet  -Attributes lightheadedness, dizziness 2 times of stress, anxiety or when moving too quickly.  Denies vertiginous component or room spinning, no recent use of meclizine.  Suspect anxiety component, as persistent anxiety symptoms.  Declines counseling at this time.  Did not tolerate sertraline, but unfortunately has only been using the sertraline intermittently, consistent daily use discussed.  Also low-dose, may need higher dose but would like to try daily  dosing to see if that  is helpful.  Will recheck in 1 month.  Check CBC, electrolytes with dizziness but less likely metabolic cause.  EKG without apparent arrhythmia. ER/RTC precautions.  Elevated hemoglobin A1c - Plan: Comprehensive metabolic panel, Hemoglobin A1c  -Check CMP, A1c for updated labs.  No current meds for diabetes.  Intermittent chest pain - Plan: EKG 12-Lead History of essential hypertension - Plan: EKG 12-Lead  -Fleeting chest symptoms with lying down, no associated symptoms, no chest pain with exertion or during anxiety/dizziness episodes.  Denies heartburn symptoms.  No acute findings on EKG.  Quickly resolves within a few seconds.  Nonexertional.  Unlikely cardiac cause, plan to follow-up in 1 month.  If anxiety related, should improve with improved adherence to sertraline.  ER chest pain precautions discussed.  Elevated LDL cholesterol level - Plan: Lipid panel Repeat lipid panel, discuss labs at follow-up in 1 month.    History of glaucoma  -With chronic loss of vision in right eye, some maintenance of vision of left eye with window of residual vision as above.  Followed by ophthalmology.  Disability paperwork received, will complete letter regarding conditions above but ophthalmic symptoms and function should be completed by her eye specialist.  Patient advised.  Will also notated on her paperwork.  Continue follow-up with ophthalmology.   Meds ordered this encounter  Medications   sertraline (ZOLOFT) 25 MG tablet    Sig: Take 1 tablet (25 mg total) by mouth daily.    Dispense:  30 tablet    Refill:  3   Patient Instructions  I will complete what I can with your paperwork, but your eye specialist will need to provide details regarding your vision limitations.   Try taking sertraline daily and we will recheck in 1 month to see if that is helping, as well as discuss plan for anxiety and lightheadedness and dizziness. I will check some labs as well today.   If any worsening of chest pain,  or that occurs more frequently be seen right away.   Recheck in 1 month. Thanks for coming in today!  Return to the clinic or go to the nearest emergency room if any of your symptoms worsen or new symptoms occur.  Dizziness Dizziness is a common problem. It is a feeling of unsteadiness or light-headedness. You may feel like you are about to faint. Dizziness can lead to injury if you stumble or fall. Anyone can become dizzy, but dizziness is more common in older adults. This condition can be caused by a number of things, including medicines, dehydration, or illness. Follow these instructions at home: Eating and drinking  Drink enough fluid to keep your urine pale yellow. This helps to keep you from becoming dehydrated. Try to drink more clear fluids, such as water. Do not drink alcohol. Limit your caffeine intake if told to do so by your health care provider. Check ingredients and nutrition facts to see if a food or beverage contains caffeine. Limit your salt (sodium) intake if told to do so by your health care provider. Check ingredients and nutrition facts to see if a food or beverage contains sodium. Activity  Avoid making quick movements. Rise slowly from chairs and steady yourself until you feel okay. In the morning, first sit up on the side of the bed. When you feel okay, stand slowly while you hold onto something until you know that your balance is good. If you need to stand in one place for a long time,  move your legs often. Tighten and relax the muscles in your legs while you are standing. Do not drive or use machinery if you feel dizzy. Avoid bending down if you feel dizzy. Place items in your home so that they are easy for you to reach without leaning over. Lifestyle Do not use any products that contain nicotine or tobacco. These products include cigarettes, chewing tobacco, and vaping devices, such as e-cigarettes. If you need help quitting, ask your health care provider. Try to  reduce your stress level by using methods such as yoga or meditation. Talk with your health care provider if you need help to manage your stress. General instructions Watch your dizziness for any changes. Take over-the-counter and prescription medicines only as told by your health care provider. Talk with your health care provider if you think that your dizziness is caused by a medicine that you are taking. Tell a friend or a family member that you are feeling dizzy. If he or she notices any changes in your behavior, have this person call your health care provider. Keep all follow-up visits. This is important. Contact a health care provider if: Your dizziness does not go away or you have new symptoms. Your dizziness or light-headedness gets worse. You feel nauseous. You have reduced hearing. You have a fever. You have neck pain or a stiff neck. Your dizziness leads to an injury or a fall. Get help right away if: You vomit or have diarrhea and are unable to eat or drink anything. You have problems talking, walking, swallowing, or using your arms, hands, or legs. You feel generally weak. You have any bleeding. You are not thinking clearly or you have trouble forming sentences. It may take a friend or family member to notice this. You have chest pain, abdominal pain, shortness of breath, or sweating. Your vision changes or you develop a severe headache. These symptoms may represent a serious problem that is an emergency. Do not wait to see if the symptoms will go away. Get medical help right away. Call your local emergency services (911 in the [place] Do not drive yourself to the hospital. Summary Dizziness is a feeling of unsteadiness or light-headedness. This condition can be caused by a number of things, including medicines, dehydration, or illness. Anyone can become dizzy, but dizziness is more common in older adults. Drink enough fluid to keep your urine pale yellow. Do not drink  alcohol. Avoid making quick movements if you feel dizzy. Monitor your dizziness for any changes. This information is not intended to replace advice given to you by your health care provider. Make sure you discuss any questions you have with your health care provider. Document Revised: 06/20/2020 Document Reviewed: 06/20/2020 Elsevier Patient Education  [place], Adult After being diagnosed with anxiety, you may be relieved to know why you have felt or behaved a certain way. You may also feel overwhelmed about the treatment ahead and what it will mean for your life. With care and support, you can manage your anxiety. How to manage lifestyle changes Understanding the difference between stress and anxiety Although stress can play a role in anxiety, it is not the same as anxiety. Stress is your body's reaction to life changes and events, both good and bad. Stress is often caused by something external, such as a deadline, test, or competition. It normally goes away after the event has ended and will last just a few hours. But, stress can be ongoing and  can lead to more than just stress. Anxiety is caused by something internal, such as imagining a terrible outcome or worrying that something will go wrong that will greatly upset you. Anxiety often does not go away even after the event is over, and it can become a long-term (chronic) worry. Lowering stress and anxiety Talk with your health care provider or a counselor to learn more about lowering anxiety and stress. They may suggest tension-reduction techniques, such as: Music. Spend time creating or listening to music that you enjoy and that inspires you. Mindfulness-based meditation. Practice being aware of your normal breaths while not trying to control your breathing. It can be done while sitting or walking. Centering prayer. Focus on a word, phrase, or sacred image that means something to you and brings you peace. Deep  breathing. Expand your stomach and inhale slowly through your nose. Hold your breath for 3-5 seconds. Then breathe out slowly, letting your stomach muscles relax. Self-talk. Learn to notice and spot thought patterns that lead to anxiety reactions. Change those patterns to thoughts that feel peaceful. Muscle relaxation. Take time to tense muscles and then relax them. Choose a tension-reduction technique that fits your lifestyle and personality. These techniques take time and practice. Set aside 5-15 minutes a day to do them. Specialized therapists can offer counseling and training in these techniques. The training to help with anxiety may be covered by some insurance plans. Other things you can do to manage stress and anxiety include: Keeping a stress diary. This can help you learn what triggers your reaction and then learn ways to manage your response. Thinking about how you react to certain situations. You may not be able to control everything, but you can control your response. Making time for activities that help you relax and not feeling guilty about spending your time in this way. Doing visual imagery. This involves imagining or creating mental pictures to help you relax. Practicing yoga. Through yoga poses, you can lower tension and relax.  Medicines Medicines for anxiety include: Antidepressant medicines. These are usually prescribed for long-term daily control. Anti-anxiety medicines. These may be added in severe cases, especially when panic attacks occur. When used together, medicines, psychotherapy, and tension-reduction techniques may be the most effective treatment. Relationships Relationships can play a big part in helping you recover. Spend more time connecting with trusted friends and family members. Think about going to couples counseling if you have a partner, taking family education classes, or going to family therapy. Therapy can help you and others better understand your  anxiety. How to recognize changes in your anxiety Everyone responds differently to treatment for anxiety. Recovery from anxiety happens when symptoms lessen and stop interfering with your daily life at home or work. This may mean that you will start to: Have better concentration and focus. Worry will interfere less in your daily thinking. Sleep better. Be less irritable. Have more energy. Have improved memory. Try to recognize when your condition is getting worse. Contact your provider if your symptoms interfere with home or work and you feel like your condition is not improving. Follow these instructions at home: Activity Exercise. Adults should: Exercise for at least 150 minutes each week. The exercise should increase your heart rate and make you sweat (moderate-intensity exercise). Do strengthening exercises at least twice a week. Get the right amount and quality of sleep. Most adults need 7-9 hours of sleep each night. Lifestyle  Eat a healthy diet that includes plenty of vegetables, fruits, whole grains,  low-fat dairy products, and lean protein. Do not eat a lot of foods that are high in fats, added sugars, or salt (sodium). Make choices that simplify your life. Do not use any products that contain nicotine or tobacco. These products include cigarettes, chewing tobacco, and vaping devices, such as e-cigarettes. If you need help quitting, ask your provider. Avoid caffeine, alcohol, and certain over-the-counter cold medicines. These may make you feel worse. Ask your pharmacist which medicines to avoid. General instructions Take over-the-counter and prescription medicines only as told by your provider. Keep all follow-up visits. This is to make sure you are managing your anxiety well or if you need more support. Where to find support You can get help and support from: Self-help groups. Online and [employer]. A trusted spiritual leader. Couples counseling. Family  education classes. Family therapy. Where to find more information You may find that joining a support group helps you deal with your anxiety. The following sources can help you find counselors or support groups near you: [place]: mentalhealthamer[place].net Anxiety and Depression Association of [place] (ADAA): adaa.[place] on Mental Illness (NAMI): nami.org Contact a health care provider if: You have a hard time staying focused or finishing tasks. You spend many hours a day feeling worried about everyday life. You are very tired because you cannot stop worrying. You start to have headaches or often feel tense. You have chronic nausea or diarrhea. Get help right away if: Your heart feels like it is racing. You have shortness of breath. You have thoughts of hurting yourself or others. Get help right away if you feel like you may hurt yourself or others, or have thoughts about taking your own life. Go to your nearest emergency room or: Call 911. Call the [facility] at (364) [phone] or 988. This is open 24 hours a day. Text the Crisis Text Line at 480 094 4495. This information is not intended to replace advice given to you by your health care provider. Make sure you discuss any questions you have with your health care provider. Document Revised: 04/24/2022 Document Reviewed: 11/06/2020 Elsevier Patient Education  [place],   Merri Ray, MD [place] [place] [facility] 10/04/22 2:22 PM

## 2022-10-04 NOTE — Patient Instructions (Addendum)
I will complete what I can with your paperwork, but your eye specialist will need to provide details regarding your vision limitations.   Try taking sertraline daily and we will recheck in 1 month to see if that is helping, as well as discuss plan for anxiety and lightheadedness and dizziness. I will check some labs as well today.   If any worsening of chest pain, or that occurs more frequently be seen right away.   Recheck in 1 month. Thanks for coming in today!  Return to the clinic or go to the nearest emergency room if any of your symptoms worsen or new symptoms occur.  Dizziness Dizziness is a common problem. It is a feeling of unsteadiness or light-headedness. You may feel like you are about to faint. Dizziness can lead to injury if you stumble or fall. Anyone can become dizzy, but dizziness is more common in older adults. This condition can be caused by a number of things, including medicines, dehydration, or illness. Follow these instructions at home: Eating and drinking  Drink enough fluid to keep your urine pale yellow. This helps to keep you from becoming dehydrated. Try to drink more clear fluids, such as water. Do not drink alcohol. Limit your caffeine intake if told to do so by your health care provider. Check ingredients and nutrition facts to see if a food or beverage contains caffeine. Limit your salt (sodium) intake if told to do so by your health care provider. Check ingredients and nutrition facts to see if a food or beverage contains sodium. Activity  Avoid making quick movements. Rise slowly from chairs and steady yourself until you feel okay. In the morning, first sit up on the side of the bed. When you feel okay, stand slowly while you hold onto something until you know that your balance is good. If you need to stand in one place for a long time, move your legs often. Tighten and relax the muscles in your legs while you are standing. Do not drive or use machinery if  you feel dizzy. Avoid bending down if you feel dizzy. Place items in your home so that they are easy for you to reach without leaning over. Lifestyle Do not use any products that contain nicotine or tobacco. These products include cigarettes, chewing tobacco, and vaping devices, such as e-cigarettes. If you need help quitting, ask your health care provider. Try to reduce your stress level by using methods such as yoga or meditation. Talk with your health care provider if you need help to manage your stress. General instructions Watch your dizziness for any changes. Take over-the-counter and prescription medicines only as told by your health care provider. Talk with your health care provider if you think that your dizziness is caused by a medicine that you are taking. Tell a friend or a family member that you are feeling dizzy. If he or she notices any changes in your behavior, have this person call your health care provider. Keep all follow-up visits. This is important. Contact a health care provider if: Your dizziness does not go away or you have new symptoms. Your dizziness or light-headedness gets worse. You feel nauseous. You have reduced hearing. You have a fever. You have neck pain or a stiff neck. Your dizziness leads to an injury or a fall. Get help right away if: You vomit or have diarrhea and are unable to eat or drink anything. You have problems talking, walking, swallowing, or using your arms, hands, or legs.  You feel generally weak. You have any bleeding. You are not thinking clearly or you have trouble forming sentences. It may take a friend or family member to notice this. You have chest pain, abdominal pain, shortness of breath, or sweating. Your vision changes or you develop a severe headache. These symptoms may represent a serious problem that is an emergency. Do not wait to see if the symptoms will go away. Get medical help right away. Call your local emergency services  (911 in the [place] Do not drive yourself to the hospital. Summary Dizziness is a feeling of unsteadiness or light-headedness. This condition can be caused by a number of things, including medicines, dehydration, or illness. Anyone can become dizzy, but dizziness is more common in older adults. Drink enough fluid to keep your urine pale yellow. Do not drink alcohol. Avoid making quick movements if you feel dizzy. Monitor your dizziness for any changes. This information is not intended to replace advice given to you by your health care provider. Make sure you discuss any questions you have with your health care provider. Document Revised: 06/20/2020 Document Reviewed: 06/20/2020 Elsevier Patient Education  [place], Adult After being diagnosed with anxiety, you may be relieved to know why you have felt or behaved a certain way. You may also feel overwhelmed about the treatment ahead and what it will mean for your life. With care and support, you can manage your anxiety. How to manage lifestyle changes Understanding the difference between stress and anxiety Although stress can play a role in anxiety, it is not the same as anxiety. Stress is your body's reaction to life changes and events, both good and bad. Stress is often caused by something external, such as a deadline, test, or competition. It normally goes away after the event has ended and will last just a few hours. But, stress can be ongoing and can lead to more than just stress. Anxiety is caused by something internal, such as imagining a terrible outcome or worrying that something will go wrong that will greatly upset you. Anxiety often does not go away even after the event is over, and it can become a long-term (chronic) worry. Lowering stress and anxiety Talk with your health care provider or a counselor to learn more about lowering anxiety and stress. They may suggest tension-reduction techniques, such  as: Music. Spend time creating or listening to music that you enjoy and that inspires you. Mindfulness-based meditation. Practice being aware of your normal breaths while not trying to control your breathing. It can be done while sitting or walking. Centering prayer. Focus on a word, phrase, or sacred image that means something to you and brings you peace. Deep breathing. Expand your stomach and inhale slowly through your nose. Hold your breath for 3-5 seconds. Then breathe out slowly, letting your stomach muscles relax. Self-talk. Learn to notice and spot thought patterns that lead to anxiety reactions. Change those patterns to thoughts that feel peaceful. Muscle relaxation. Take time to tense muscles and then relax them. Choose a tension-reduction technique that fits your lifestyle and personality. These techniques take time and practice. Set aside 5-15 minutes a day to do them. Specialized therapists can offer counseling and training in these techniques. The training to help with anxiety may be covered by some insurance plans. Other things you can do to manage stress and anxiety include: Keeping a stress diary. This can help you learn what triggers your reaction and then learn ways to  manage your response. Thinking about how you react to certain situations. You may not be able to control everything, but you can control your response. Making time for activities that help you relax and not feeling guilty about spending your time in this way. Doing visual imagery. This involves imagining or creating mental pictures to help you relax. Practicing yoga. Through yoga poses, you can lower tension and relax.  Medicines Medicines for anxiety include: Antidepressant medicines. These are usually prescribed for long-term daily control. Anti-anxiety medicines. These may be added in severe cases, especially when panic attacks occur. When used together, medicines, psychotherapy, and tension-reduction  techniques may be the most effective treatment. Relationships Relationships can play a big part in helping you recover. Spend more time connecting with trusted friends and family members. Think about going to couples counseling if you have a partner, taking family education classes, or going to family therapy. Therapy can help you and others better understand your anxiety. How to recognize changes in your anxiety Everyone responds differently to treatment for anxiety. Recovery from anxiety happens when symptoms lessen and stop interfering with your daily life at home or work. This may mean that you will start to: Have better concentration and focus. Worry will interfere less in your daily thinking. Sleep better. Be less irritable. Have more energy. Have improved memory. Try to recognize when your condition is getting worse. Contact your provider if your symptoms interfere with home or work and you feel like your condition is not improving. Follow these instructions at home: Activity Exercise. Adults should: Exercise for at least 150 minutes each week. The exercise should increase your heart rate and make you sweat (moderate-intensity exercise). Do strengthening exercises at least twice a week. Get the right amount and quality of sleep. Most adults need 7-9 hours of sleep each night. Lifestyle  Eat a healthy diet that includes plenty of vegetables, fruits, whole grains, low-fat dairy products, and lean protein. Do not eat a lot of foods that are high in fats, added sugars, or salt (sodium). Make choices that simplify your life. Do not use any products that contain nicotine or tobacco. These products include cigarettes, chewing tobacco, and vaping devices, such as e-cigarettes. If you need help quitting, ask your provider. Avoid caffeine, alcohol, and certain over-the-counter cold medicines. These may make you feel worse. Ask your pharmacist which medicines to avoid. General instructions Take  over-the-counter and prescription medicines only as told by your provider. Keep all follow-up visits. This is to make sure you are managing your anxiety well or if you need more support. Where to find support You can get help and support from: Self-help groups. Online and [employer]. A trusted spiritual leader. Couples counseling. Family education classes. Family therapy. Where to find more information You may find that joining a support group helps you deal with your anxiety. The following sources can help you find counselors or support groups near you: [place]: mentalhealthamerica.net Anxiety and Depression Association of [place] (ADAA): adaa.[place] on Mental Illness (NAMI): nami.org Contact a health care provider if: You have a hard time staying focused or finishing tasks. You spend many hours a day feeling worried about everyday life. You are very tired because you cannot stop worrying. You start to have headaches or often feel tense. You have chronic nausea or diarrhea. Get help right away if: Your heart feels like it is racing. You have shortness of breath. You have thoughts of hurting yourself or others. Get help right  away if you feel like you may hurt yourself or others, or have thoughts about taking your own life. Go to your nearest emergency room or: Call 911. Call the [place] at 3404694184 or 988. This is open 24 hours a day. Text the Crisis Text Line at 561-047-0862. This information is not intended to replace advice given to you by your health care provider. Make sure you discuss any questions you have with your health care provider. Document Revised: 04/24/2022 Document Reviewed: 11/06/2020 Elsevier Patient Education  [place].

## 2022-10-04 NOTE — Telephone Encounter (Signed)
In your sign folder for today

## 2022-10-04 NOTE — Telephone Encounter (Signed)
Scheduled patient for today at 1:20

## 2022-10-05 ENCOUNTER — Ambulatory Visit (AMBULATORY_SURGERY_CENTER): Payer: 59 | Admitting: Gastroenterology

## 2022-10-05 ENCOUNTER — Encounter: Payer: Self-pay | Admitting: Gastroenterology

## 2022-10-05 VITALS — BP 114/78 | HR 67 | Temp 99.1°F | Resp 18 | Ht 67.0 in | Wt 160.0 lb

## 2022-10-05 DIAGNOSIS — D123 Benign neoplasm of transverse colon: Secondary | ICD-10-CM | POA: Diagnosis not present

## 2022-10-05 DIAGNOSIS — K635 Polyp of colon: Secondary | ICD-10-CM | POA: Diagnosis not present

## 2022-10-05 DIAGNOSIS — D122 Benign neoplasm of ascending colon: Secondary | ICD-10-CM | POA: Diagnosis not present

## 2022-10-05 DIAGNOSIS — D3A8 Other benign neuroendocrine tumors: Secondary | ICD-10-CM | POA: Diagnosis not present

## 2022-10-05 DIAGNOSIS — D128 Benign neoplasm of rectum: Secondary | ICD-10-CM

## 2022-10-05 DIAGNOSIS — Z1211 Encounter for screening for malignant neoplasm of colon: Secondary | ICD-10-CM

## 2022-10-05 DIAGNOSIS — I10 Essential (primary) hypertension: Secondary | ICD-10-CM | POA: Diagnosis not present

## 2022-10-05 HISTORY — PX: COLONOSCOPY: SHX174

## 2022-10-05 LAB — LIPID PANEL
Cholesterol: 233 mg/dL — ABNORMAL HIGH (ref 0–200)
HDL: 51.6 mg/dL (ref >39.00)
LDL Cholesterol: 145 mg/dL — ABNORMAL HIGH (ref 0–99)
NonHDL: 181.13
Total CHOL/HDL Ratio: 5
Triglycerides: 183 mg/dL — ABNORMAL HIGH (ref 0.0–149.0)
VLDL: 36.6 mg/dL (ref 0.0–40.0)

## 2022-10-05 LAB — COMPREHENSIVE METABOLIC PANEL
ALT: 20 U/L (ref 0–35)
AST: 21 U/L (ref 0–37)
Albumin: 4.1 g/dL (ref 3.5–5.2)
Alkaline Phosphatase: 75 U/L (ref 39–117)
BUN: 8 mg/dL (ref 6–23)
CO2: 27 mEq/L (ref 19–32)
Calcium: 9.3 mg/dL (ref 8.4–10.5)
Chloride: 106 mEq/L (ref 96–112)
Creatinine, Ser: 0.89 mg/dL (ref 0.40–1.20)
GFR: 73.47 mL/min (ref >60.00)
Glucose, Bld: 125 mg/dL — ABNORMAL HIGH (ref 70–99)
Potassium: 4.4 mEq/L (ref 3.5–5.1)
Sodium: 142 mEq/L (ref 135–145)
Total Bilirubin: 0.4 mg/dL (ref 0.2–1.2)
Total Protein: 7 g/dL (ref 6.0–8.3)

## 2022-10-05 MED ORDER — SODIUM CHLORIDE 0.9 % IV SOLN: 500.0000 mL | Freq: Once | INTRAVENOUS | Status: DC

## 2022-10-05 NOTE — Op Note (Signed)
[facility] Patient Name: Brandi Cooke Procedure Date: 10/05/2022 1:28 PM MRN: [id number] Endoscopist: Thornton Park MD, MD, [id number] Age: 57 Referring MD:  Date of Birth: 1965/12/27 Gender: Female Account #: [id number] Procedure:                Colonoscopy Indications:              Screening for colorectal malignant neoplasm, This                            is the patient's first colonoscopy                           No known family history of colon cancer or polyps Medicines:                Monitored Anesthesia Care Procedure:                Pre-Anesthesia Assessment:                           - Prior to the procedure, a History and Physical                            was performed, and patient medications and                            allergies were reviewed. The patient's tolerance of                            previous anesthesia was also reviewed. The risks                            and benefits of the procedure and the sedation                            options and risks were discussed with the patient.                            All questions were answered, and informed consent                            was obtained. Prior Anticoagulants: The patient has                            taken no anticoagulant or antiplatelet agents. ASA                            Grade Assessment: II - A patient with mild systemic                            disease. After reviewing the risks and benefits,                            the patient was deemed in satisfactory condition to  undergo the procedure.                           After obtaining informed consent, the colonoscope                            was passed under direct vision. Throughout the                            procedure, the patient's blood pressure, pulse, and                            oxygen saturations were monitored continuously. The                            Olympus [id number]  SN [id number] was introduced through                            the anus and advanced to the 3 cm into the ileum. A                            second forward view of the right colon was                            performed. The colonoscopy was performed without                            difficulty. The patient tolerated the procedure                            well. The quality of the bowel preparation was                            good. The terminal ileum, ileocecal valve,                            appendiceal orifice, and rectum were photographed. Scope In: 1:34:19 PM Scope Out: 1:55:10 PM Scope Withdrawal Time: 0 hours 16 minutes 32 seconds  Total Procedure Duration: 0 hours 20 minutes 51 seconds  Findings:                 The perianal and digital rectal examinations were                            normal.                           A few medium-mouthed and small-mouthed diverticula                            were found in the sigmoid colon and descending                            colon.  A 5 mm polyp was found in the rectum. The polyp was                            sessile. The polyp was removed with a hot snare.                            Resection and retrieval were complete. Area was                            tattooed with an injection of 2 mL of [place] ink.                           A 3 mm polyp was found in the transverse colon. The                            polyp was sessile. The polyp was removed with a                            cold snare. Resection and retrieval were complete.                            Estimated blood loss was minimal.                           The exam was otherwise without abnormality on                            direct and retroflexion views. Complications:            No immediate complications. Estimated Blood Loss:     Estimated blood loss was minimal. Impression:               - Diverticulosis in the sigmoid colon and in  the                            descending colon.                           - One 5 mm polyp in the rectum, removed with a hot                            snare. Resected and retrieved. Tattooed.                           - One 3 mm polyp in the transverse colon, removed                            with a cold snare. Resected and retrieved.                           - The examination was otherwise normal on direct  and retroflexion views. Recommendation:           - Patient has a contact number available for                            emergencies. The signs and symptoms of potential                            delayed complications were discussed with the                            patient. Return to normal activities tomorrow.                            Written discharge instructions were provided to the                            patient.                           - High fiber diet.                           - Continue present medications.                           - Await pathology results.                           - Repeat colonoscopy date to be determined after                            pending pathology results are reviewed for                            surveillance.                           - Emerging evidence supports eating a diet of                            fruits, vegetables, grains, calcium, and yogurt                            while reducing red meat and alcohol may reduce the                            risk of colon cancer.                           - Thank you for allowing me to be involved in your                            colon cancer prevention. Thornton Park MD, MD 10/05/2022 2:00:36 PM This report has been signed electronically.

## 2022-10-05 NOTE — Progress Notes (Signed)
Nad vss trans to pacu 

## 2022-10-05 NOTE — Progress Notes (Signed)
Called to room to assist during endoscopic procedure.  Patient ID and intended procedure confirmed with present staff. Received instructions for my participation in the procedure from the performing physician.  

## 2022-10-05 NOTE — Progress Notes (Signed)
Pt's states no medical or surgical changes since previsit or office visit. 

## 2022-10-05 NOTE — Patient Instructions (Signed)
Resume previous diet( High fiber diet recommended) Continue present medications Await pathology results Handouts given for high fiber diet, polyps and diverticulosis.  YOU HAD AN ENDOSCOPIC PROCEDURE TODAY AT [facility]:   Refer to the procedure report that was given to you for any specific questions about what was found during the examination.  If the procedure report does not answer your questions, please call your gastroenterologist to clarify.  If you requested that your care partner not be given the details of your procedure findings, then the procedure report has been included in a sealed envelope for you to review at your convenience later.  YOU SHOULD EXPECT: Some feelings of bloating in the abdomen. Passage of more gas than usual.  Walking can help get rid of the air that was put into your GI tract during the procedure and reduce the bloating. If you had a lower endoscopy (such as a colonoscopy or flexible sigmoidoscopy) you may notice spotting of blood in your stool or on the toilet paper. If you underwent a bowel prep for your procedure, you may not have a normal bowel movement for a few days.  Please Note:  You might notice some irritation and congestion in your nose or some drainage.  This is from the oxygen used during your procedure.  There is no need for concern and it should clear up in a day or so.  SYMPTOMS TO REPORT IMMEDIATELY:  Following lower endoscopy (colonoscopy):  Excessive amounts of blood in the stool  Significant tenderness or worsening of abdominal pains  Swelling of the abdomen that is new, acute  Fever of 100F or higher  For urgent or emergent issues, a gastroenterologist can be reached at any hour by calling [phone]. Do not use MyChart messaging for urgent concerns.    DIET:  We do recommend a small meal at first, but then you may proceed to your regular diet.  Drink plenty of fluids but you should avoid alcoholic beverages for 24  hours.  ACTIVITY:  You should plan to take it easy for the rest of today and you should NOT DRIVE or use heavy machinery until tomorrow (because of the sedation medicines used during the test).    FOLLOW UP: Our staff will call the number listed on your records the next business day following your procedure.  We will call around 7:15- 8:00 am to check on you and address any questions or concerns that you may have regarding the information given to you following your procedure. If we do not reach you, we will leave a message.     If any biopsies were taken you will be contacted by phone or by letter within the next 1-3 weeks.  Please call us at [phone] if you have not heard about the biopsies in 3 weeks.    SIGNATURES/CONFIDENTIALITY: You and/or your care partner have signed paperwork which will be entered into your electronic medical record.  These signatures attest to the fact that that the information above on your After Visit Summary has been reviewed and is understood.  Full responsibility of the confidentiality of this discharge information lies with you and/or your care-partner.

## 2022-10-05 NOTE — Progress Notes (Signed)
Referring Provider: Wendie Agreste, MD Primary Care Physician:  Wendie Agreste, MD  Indication for Colonoscopy:  Colon cancer screening   IMPRESSION:  Need for colon cancer screening Appropriate candidate for monitored anesthesia care  PLAN: Colonoscopy in the [place] today   HPI: Brandi Cooke is a 57 y.o. female presents for screening colonoscopy.  No prior colonoscopy or colon cancer screening.  No known family history of colon cancer or polyps. No family history of uterine/endometrial cancer, pancreatic cancer or gastric/stomach cancer.   Past Medical History:  Diagnosis Date   Allergy    Anemia    Bell's palsy    Fibroid    Glaucoma    Open   Hypertension    Stroke [facility])    Vertigo     Past Surgical History:  Procedure Laterality Date   BREAST EXCISIONAL BIOPSY Left    BREAST LUMPECTOMY WITH RADIOACTIVE SEED LOCALIZATION Left 01/12/2014   Procedure: LEFT BREAST LUMPECTOMY WITH RADIOACTIVE SEED LOCALIZATION;  Surgeon: Joyice Faster. Cornett, MD;  Location: [place];  Service: General;  Laterality: Left;   BREAST SURGERY     Cyst removed   HYSTEROSCOPY  2002   myomectomy   MYOMECTOMY  10/2005   PELVIC LAPAROSCOPY     Expl. Lap.   REFRACTIVE SURGERY  07/26/2022    Current Outpatient Medications  Medication Sig Dispense Refill   albuterol (VENTOLIN HFA) 108 (90 Base) MCG/ACT inhaler 1 to 2 puffs every 4-6 hours as needed for wheezing, shortness of breath.  If 2 puffs, separate by 5 minutes.     bimatoprost (LUMIGAN) 0.03 % ophthalmic solution Place 1 drop into both eyes at bedtime.     dorzolamide-timolol (COSOPT) 2-0.5 % ophthalmic solution 1 drop 2 (two) times daily. (Patient not taking: Reported on 09/10/2022)     latanoprost (XALATAN) 0.005 % ophthalmic solution 1 drop at bedtime. (Patient not taking: Reported on 09/10/2022)     ofloxacin (OCUFLOX) 0.3 % ophthalmic solution Place 1 drop into the left eye 4 (four) times daily. (Patient  not taking: Reported on 09/10/2022)     PEG-KCl-NaCl-NaSulf-Na Asc-C (PLENVU) 140 g SOLR Dispense one kit,use as directed 1 each 0   prednisoLONE acetate (PRED FORTE) 1 % ophthalmic suspension Place 1 drop into the left eye 4 (four) times daily.     sertraline (ZOLOFT) 25 MG tablet Take 1 tablet (25 mg total) by mouth daily. 30 tablet 3   Current Facility-Administered Medications  Medication Dose Route Frequency Provider Last Rate Last Admin   0.9 %  sodium chloride infusion  500 mL Intravenous Once Thornton Park, MD        Allergies as of 10/05/2022 - Review Complete 10/05/2022  Allergen Reaction Noted   Latex Itching and Rash 01/12/2014    Family History  Problem Relation Age of Onset   Hypertension Mother    Diabetes Mother    Stroke Brother    Migraines Brother    Migraines Brother    Heart disease Brother    Thyroid disease Brother    Cancer Maternal Uncle        Unknown type   Colon polyps Neg Hx    Colon cancer Neg Hx    Esophageal cancer Neg Hx    Stomach cancer Neg Hx    Rectal cancer Neg Hx      Physical Exam: General:   Alert,  well-nourished, pleasant and cooperative in NAD Head:  Normocephalic and atraumatic. Eyes:  Sclera clear, no  icterus.   Conjunctiva pink. Mouth:  No deformity or lesions.   Neck:  Supple; no masses or thyromegaly. Lungs:  Clear throughout to auscultation.   No wheezes. Heart:  Regular rate and rhythm; no murmurs. Abdomen:  Soft, non-tender, nondistended, normal bowel sounds, no rebound or guarding.  Msk:  Symmetrical. No boney deformities LAD: No inguinal or umbilical LAD Extremities:  No clubbing or edema. Neurologic:  Alert and  oriented x4;  grossly nonfocal Skin:  No obvious rash or bruise. Psych:  Alert and cooperative. Normal mood and affect.     Studies/Results: No results found.    Tira Lafferty L. Tarri Glenn, MD, MPH 10/05/2022, 1:21 PM

## 2022-10-08 ENCOUNTER — Telehealth: Payer: Self-pay | Admitting: *Deleted

## 2022-10-08 NOTE — Telephone Encounter (Signed)
  Follow up Call-     10/05/2022    1:07 PM  Call back number  Post procedure Call Back phone  # [phone]  Permission to leave phone message Yes     Patient questions:  Do you have a fever, pain , or abdominal swelling? No. Pain Score  0 *  Have you tolerated food without any problems? Yes.    Have you been able to return to your normal activities? Yes.    Do you have any questions about your discharge instructions: Diet   No. Medications  No. Follow up visit  No.  Do you have questions or concerns about your Care? No.  Actions: * If pain score is 4 or above: No action needed, pain <4.

## 2022-10-12 ENCOUNTER — Other Ambulatory Visit: Payer: Self-pay

## 2022-10-12 ENCOUNTER — Encounter (HOSPITAL_COMMUNITY): Payer: Self-pay

## 2022-10-12 ENCOUNTER — Emergency Department (HOSPITAL_COMMUNITY)
Admission: EM | Admit: 2022-10-12 | Discharge: 2022-10-13 | Disposition: A | Discharge status: Home / Self Care | Payer: Medicaid Other | Attending: Emergency Medicine | Admitting: Emergency Medicine

## 2022-10-12 ENCOUNTER — Emergency Department (HOSPITAL_COMMUNITY): Payer: Medicaid Other

## 2022-10-12 DIAGNOSIS — Y92410 Unspecified street and highway as the place of occurrence of the external cause: Secondary | ICD-10-CM | POA: Diagnosis not present

## 2022-10-12 DIAGNOSIS — R519 Headache, unspecified: Secondary | ICD-10-CM | POA: Insufficient documentation

## 2022-10-12 DIAGNOSIS — M545 Low back pain, unspecified: Secondary | ICD-10-CM | POA: Diagnosis not present

## 2022-10-12 DIAGNOSIS — M542 Cervicalgia: Secondary | ICD-10-CM | POA: Diagnosis present

## 2022-10-12 DIAGNOSIS — S0990XA Unspecified injury of head, initial encounter: Secondary | ICD-10-CM | POA: Diagnosis not present

## 2022-10-12 DIAGNOSIS — Z91040 Latex allergy status: Secondary | ICD-10-CM | POA: Insufficient documentation

## 2022-10-12 DIAGNOSIS — S199XXA Unspecified injury of neck, initial encounter: Secondary | ICD-10-CM | POA: Diagnosis not present

## 2022-10-12 DIAGNOSIS — M5136 Other intervertebral disc degeneration, lumbar region: Secondary | ICD-10-CM | POA: Diagnosis not present

## 2022-10-12 DIAGNOSIS — S161XXA Strain of muscle, fascia and tendon at neck level, initial encounter: Secondary | ICD-10-CM | POA: Diagnosis not present

## 2022-10-12 MED ORDER — IBUPROFEN 600 MG PO TABS: 600.0000 mg | ORAL_TABLET | Freq: Four times a day (QID) | ORAL | 0 refills | Status: DC | PRN

## 2022-10-12 MED ORDER — CYCLOBENZAPRINE HCL 10 MG PO TABS: 10.0000 mg | ORAL_TABLET | Freq: Two times a day (BID) | ORAL | 0 refills | Status: AC | PRN

## 2022-10-12 MED ORDER — IBUPROFEN 200 MG PO TABS
600.0000 mg | ORAL_TABLET | Freq: Once | ORAL | Status: AC
  Administered 2022-10-12: 600 mg via ORAL
  Filled 2022-10-12: qty 3

## 2022-10-12 MED ORDER — ACETAMINOPHEN 500 MG PO TABS
1000.0000 mg | ORAL_TABLET | Freq: Once | ORAL | Status: AC
  Administered 2022-10-12: 1000 mg via ORAL
  Filled 2022-10-12: qty 2

## 2022-10-12 MED ORDER — ACETAMINOPHEN 325 MG PO TABS: 650.0000 mg | ORAL_TABLET | Freq: Four times a day (QID) | ORAL | 0 refills | Status: DC | PRN

## 2022-10-12 MED ORDER — CYCLOBENZAPRINE HCL 10 MG PO TABS
10.0000 mg | ORAL_TABLET | Freq: Once | ORAL | Status: AC
  Administered 2022-10-12: 10 mg via ORAL
  Filled 2022-10-12: qty 1
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
## 2022-10-14 ENCOUNTER — Encounter: Payer: Self-pay | Admitting: Family Medicine

## 2022-10-14 NOTE — Telephone Encounter (Signed)
Letter completed, printed, can be submitted with the remainder of the paperwork on Monday.

## 2022-10-15 NOTE — Telephone Encounter (Signed)
I poses this letter, will collect paperwork

## 2022-10-15 NOTE — Telephone Encounter (Signed)
Printed requested records, sent to Fax as requested

## 2022-10-17 ENCOUNTER — Telehealth: Payer: Self-pay | Admitting: Family Medicine

## 2022-10-17 NOTE — Telephone Encounter (Signed)
Disability Determination Services paperwork placed in front bin.

## 2022-11-08 ENCOUNTER — Encounter: Payer: Self-pay | Admitting: Family Medicine

## 2022-11-08 ENCOUNTER — Ambulatory Visit (INDEPENDENT_AMBULATORY_CARE_PROVIDER_SITE_OTHER): Payer: Medicaid Other | Admitting: Family Medicine

## 2022-11-08 VITALS — BP 130/82 | HR 79 | Temp 98.9°F | Resp 16 | Ht 67.0 in | Wt 170.4 lb

## 2022-11-08 DIAGNOSIS — F418 Other specified anxiety disorders: Secondary | ICD-10-CM | POA: Diagnosis not present

## 2022-11-08 DIAGNOSIS — E785 Hyperlipidemia, unspecified: Secondary | ICD-10-CM | POA: Diagnosis not present

## 2022-11-08 DIAGNOSIS — R42 Dizziness and giddiness: Secondary | ICD-10-CM

## 2022-11-08 DIAGNOSIS — E1169 Type 2 diabetes mellitus with other specified complication: Secondary | ICD-10-CM

## 2022-11-08 MED ORDER — METFORMIN HCL 500 MG PO TABS: 500.0000 mg | ORAL_TABLET | Freq: Every day | ORAL | 1 refills | Status: DC

## 2022-11-08 MED ORDER — BLOOD GLUCOSE MONITORING SUPPL DEVI: 1.0000 | Freq: Every day | 0 refills | Status: AC

## 2022-11-08 MED ORDER — BLOOD GLUCOSE TEST VI STRP: 1.0000 | ORAL_STRIP | Freq: Every day | 0 refills | Status: DC

## 2022-11-08 MED ORDER — LANCET DEVICE MISC: 1.0000 | Freq: Every day | 0 refills | Status: AC

## 2022-11-08 MED ORDER — SERTRALINE HCL 25 MG PO TABS: 25.0000 mg | ORAL_TABLET | Freq: Every day | ORAL | 1 refills | Status: DC

## 2022-11-08 MED ORDER — LANCETS MISC. MISC: 1.0000 | Freq: Every day | 0 refills | Status: AC

## 2022-11-08 NOTE — Patient Instructions (Addendum)
Glad to hear the dizziness has resolved and anxiety has improved. Continue sertraline daily.  Return to the clinic or go to the nearest emergency room if any of your symptoms worsen or new symptoms occur.  Cholesterol elevated but at a level we can diet/exercise approach initially and repeat labs in  3 months. Keep up the good work.   Start metformin once per day for diabetes. See info below and recheck in 3 months. Check blood sugar up to once per day.   Take care!   Type 2 Diabetes Mellitus, Self-Care, Adult Caring for yourself after you have been diagnosed with type 2 diabetes (type 2 diabetes mellitus) means keeping your blood sugar (glucose) under control with a balance of: Nutrition. Exercise. Lifestyle changes. Medicines or insulin, if needed. Support from your team of health care providers and others. What are the risks? Having type 2 diabetes can put you at risk for other long-term (chronic) conditions, such as heart disease and kidney disease. Your health care provider may prescribe medicines to help prevent complications from diabetes. How to monitor your blood glucose  Check your blood glucose every day or as often as told by your health care provider. Have your A1C (hemoglobin A1C) level checked two or more times a year, or as often as told by your health care provider. Your health care provider will set personalized treatment goals for you. Generally, the goal of treatment is to maintain the following blood glucose levels: Before meals: 80-130 mg/dL (6.5-6.8 mmol/L). After meals: below 180 mg/dL (10 mmol/L). A1C level: less than 7%. How to manage hyperglycemia and hypoglycemia Hyperglycemia symptoms Hyperglycemia, also called high blood glucose, occurs when blood glucose is too high. Make sure you know the early signs of hyperglycemia, such as: Increased thirst. Hunger. Feeling very tired. Needing to urinate more often than usual. Blurry vision. Hypoglycemia  symptoms Hypoglycemia, also called low blood glucose, occurs with a blood glucose level at or below 70 mg/dL (3.9 mmol/L). Diabetes medicines lower your blood glucose and can cause hypoglycemia. The risk for hypoglycemia increases during or after exercise, during sleep, during illness, and when skipping meals or not eating for a long time (fasting). It is important to know the symptoms of hypoglycemia and treat it right away. Always have a 15-gram rapid-acting carbohydrate snack with you to treat low blood glucose. Family members and close friends should also know the symptoms and understand how to treat hypoglycemia, in case you are not able to treat yourself. Symptoms may include: Hunger. Anxiety. Sweating and feeling clammy. Dizziness or feeling light-headed. Sleepiness. Increased heart rate. Irritability. Tingling or numbness around the mouth, lips, or tongue. Restless sleep. Severe hypoglycemia is when your blood glucose level is at or below 54 mg/dL (3 mmol/L). Severe hypoglycemia is an emergency. Do not wait to see if the symptoms will go away. Get medical help right away. Call your local emergency services (911 in the [place] Do not drive yourself to the hospital. If you have severe hypoglycemia and you cannot eat or drink, you may need glucagon. A family member or close friend should learn how to check your blood glucose and how to give you glucagon. Ask your health care provider if you need to have an emergency glucagon kit available. Follow these instructions at home: Medicines Take prescribed insulin or diabetes medicines as told by your health care provider. Do not run out of insulin or other diabetes medicines. Plan ahead so you always have these available. If you use insulin,  adjust your dosage based on your physical activity and what foods you eat. Your health care provider will tell you how to adjust your dosage. Take over-the-counter and prescription medicines only as told by  your health care provider. Eating and drinking  What you eat and drink affects your blood glucose and your insulin dosage. Making good choices helps to control your diabetes and prevent other health problems. A healthy meal plan includes eating lean proteins, complex carbohydrates, fresh fruits and vegetables, low-fat dairy products, and healthy fats. Make an appointment to see a registered dietitian to help you create an eating plan that is right for you. Make sure that you: Follow instructions from your health care provider about eating or drinking restrictions. Drink enough fluid to keep your urine pale yellow. Keep a record of the carbohydrates that you eat. Do this by reading food labels and learning the standard serving sizes of foods. Follow your sick-day plan whenever you cannot eat or drink as usual. Make this plan in advance with your health care provider.  Activity Stay active. Exercise regularly, as told by your health care provider. This may include: Stretching and doing strength exercises, such as yoga or weight lifting, two or more times a week. Doing 150 minutes or more of moderate-intensity or vigorous-intensity exercise each week. This could be brisk walking, biking, or water aerobics. Spread out your activity over 3 or more days of the week. Do not go more than 2 days in a row without doing some kind of physical activity. When you start a new exercise or activity, work with your health care provider to adjust your insulin, medicines, or food intake as needed. Lifestyle Do not use any products that contain nicotine or tobacco. These products include cigarettes, chewing tobacco, and vaping devices, such as e-cigarettes. If you need help quitting, ask your health care provider. If you drink alcohol and your health care provider says that it is safe for you: Limit how much you have to: 0-1 drink a day for women who are not pregnant. 0-2 drinks a day for men. Know how much  alcohol is in your drink. In the [place] one drink equals one 12 oz bottle of beer (355 mL), one 5 oz glass of wine (148 mL), or one 1 oz glass of hard liquor (44 mL). Learn to manage stress. If you need help with this, ask your health care provider. Take care of your body  Keep your immunizations up to date. In addition to getting vaccinations as told by your health care provider, it is recommended that you get vaccinated against the following illnesses: The flu (influenza). Get a flu shot every year. Pneumonia. Hepatitis B. Schedule an eye exam soon after your diagnosis, and then one time every year after that. Check your skin and feet every day for cuts, bruises, redness, blisters, or sores. Schedule a foot exam with your health care provider once every year. Brush your teeth and gums two times a day, and floss one or more times a day. Visit your dentist one or more times every 6 months. Maintain a healthy weight. General instructions Share your diabetes management plan with people in your workplace, school, and household. Carry a medical alert card or wear medical alert jewelry. Keep all follow-up visits. This is important. Questions to ask your health care provider Should I meet with a certified diabetes care and education specialist? Where can I find a support group for people with diabetes? Where to find more information For  help and guidance and for more information about diabetes, please visit: American Diabetes Association (ADA): www.diabetes.org American Association of Diabetes Care and Education Specialists (ADCES): www.diabeteseducator.org International Diabetes Federation (IDF): DCOnly.dk Summary Caring for yourself after you have been diagnosed with type 2 diabetes (type 2 diabetes mellitus) means keeping your blood sugar (glucose) under control with a balance of nutrition, exercise, lifestyle changes, and medicine. Check your blood glucose every day, as often as told by  your health care provider. Having diabetes can put you at risk for other long-term (chronic) conditions, such as heart disease and kidney disease. Your health care provider may prescribe medicines to help prevent complications from diabetes. Share your diabetes management plan with people in your workplace, school, and household. Keep all follow-up visits. This is important. This information is not intended to replace advice given to you by your health care provider. Make sure you discuss any questions you have with your health care provider. Document Revised: 12/14/2020 Document Reviewed: 12/14/2020 Elsevier Patient Education  2023 [employer].

## 2022-11-08 NOTE — Progress Notes (Signed)
Subjective:  Patient ID: Brandi Cooke, female    DOB: [birth year]  Age: 57 y.o. MRN: [id number]  CC:  Chief Complaint  Patient presents with   Dizziness    Pt states that's she has not had any lightheartedness or dizziness since last visit     HPI SUTTON BOSSARD presents for   Intermittent lightheadedness Discussed at March 7 visit. Appeared to be due to stress or anxiety, or with moving too quickly. Denied vertiginous component at that time but some concern of possible vertigo previously.Marland Kitchen  CBC was normal.  Glucose 125 but other electrolytes were normal.  No acute findings on EKG, or sign of arrhythmia.  Recommended daily dosing of sertraline if anxiety as possible trigger.  Additionally had noted intermittent chest symptoms when she was lying down.  Fleeting symptoms for a few seconds and nonexertional, unlikely cardiac cause.   Dizziness has resolved. Has been taking sertraline, less anxious and no new side effects on sertraline. Would like to continue. No chest pain.   Hyperlipidemia: Persistent elevation on most recent labs.  No current statin. Problem list with hx of stoke - about 16 years ago.  Negative CT on 10/27/04 - seen in ER with left facial droop. reports follow up with neuro for left facial weakness. Advised had Bells Palsy.  No other info in chart noted regarding CVA - removed from problem list.  Trying to eat better - oatmeal, cheerios, seafood. Less fast food.  The 10-year ASCVD risk score (Arnett DK, et al., 2019) is: 16.9%   Values used to calculate the score:     Age: 31 years     Sex: Female     Is Non-Hispanic African American: Yes     Diabetic: Yes     Tobacco smoker: Yes     Systolic Blood Pressure: 130 mmHg     Is BP treated: No     HDL Cholesterol: 51.6 mg/dL     Total Cholesterol: 233 mg/dL   Lab Results  Component Value Date   CHOL 233 (H) 10/04/2022   HDL 51.60 10/04/2022   LDLCALC 145 (H) 10/04/2022   TRIG 183.0 (H) 10/04/2022    CHOLHDL 5 10/04/2022   Lab Results  Component Value Date   ALT 20 10/04/2022   AST 21 10/04/2022   ALKPHOS 75 10/04/2022   BILITOT 0.4 10/04/2022   Diabetes: Elevated last A1C last visit. Trying to improve diet. No current meds.   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 Cornett, MD;  Location: [facility];  Service: General;  Laterality: Left;   BREAST SURGERY     Cyst removed   HYSTEROSCOPY  2002   myomectomy   MYOMECTOMY  10/2005   PELVIC LAPAROSCOPY     Expl. Lap.   REFRACTIVE SURGERY  07/26/2022   Allergies  Allergen Reactions   Latex Itching and Rash   Prior to Admission medications   Medication Sig Start Date End Date Taking? Authorizing Provider  acetaminophen (TYLENOL) 325 MG tablet Take 2 tablets (650 mg total) by mouth every 6 (six) hours as needed for up to 30 doses for moderate pain or mild pain. 10/12/22  Yes Trifan, Kermit Balo, MD  albuterol (VENTOLIN HFA) 108 (90 Base) MCG/ACT inhaler 1 to 2 puffs every 4-6 hours as needed for wheezing, shortness of breath.  If 2 puffs, separate by 5 minutes. 09/17/21  Yes [provider]  bimatoprost (LUMIGAN) 0.03 % ophthalmic solution Place 1 drop into both eyes at bedtime.   Yes [provider]  cyclobenzaprine (FLEXERIL) 10  MG tablet Take 1 tablet (10 mg total) by mouth 2 (two) times daily as needed for up to 14 doses for muscle spasms. 10/12/22  Yes Trifan, Kermit Balo, MD  ibuprofen (ADVIL) 600 MG tablet Take 1 tablet (600 mg total) by mouth every 6 (six) hours as needed for up to 30 doses for headache or mild pain. 10/12/22  Yes Terald Sleeper, MD  sertraline (ZOLOFT) 25 MG tablet Take 1 tablet (25 mg total) by mouth daily. 10/04/22  Yes Shade Flood, MD  dorzolamide-timolol (COSOPT) 2-0.5 % ophthalmic solution 1 drop 2 (two) times daily. Patient not taking: Reported on 11/08/2022 07/14/22   [provider]  latanoprost (XALATAN) 0.005 % ophthalmic solution 1 drop at bedtime. Patient not taking: Reported on 09/10/2022 07/14/22   [provider]  ofloxacin (OCUFLOX) 0.3 % ophthalmic solution Place 1 drop into the left eye 4 (four) times daily. Patient not taking: Reported on 09/10/2022 06/08/22   [provider]  prednisoLONE acetate (PRED FORTE) 1 % ophthalmic suspension Place 1 drop into the left eye 4 (four) times daily. Patient not taking: Reported on 10/05/2022 07/14/22   [provider]   Social History   Socioeconomic History   Marital status: Single    Spouse name: Not on file   Number of children: Not on file   Years of education: Not on file   Highest education level: Not on file  Occupational History   Not on file  Tobacco Use   Smoking status: Every Day    Packs/day: .5    Types: Cigarettes   Smokeless tobacco: Never  Vaping Use   Vaping Use: Never used  Substance and Sexual Activity   Alcohol use: Not Currently    Alcohol/week: 5.0 standard drinks of alcohol    Types: 5 Cans of beer per week   Drug use: No   Sexual activity: Not Currently    Comment: 1st intercourse 57 yo-Fewer than 5 partners  Other Topics Concern   Not on file  Social History Narrative   Not on file   Social Determinants of Health   Financial Resource Strain: Not on file  Food  Insecurity: No Food Insecurity (09/18/2022)   Hunger Vital Sign    Worried About Running Out of Food in the Last Year: Never true    Ran Out of Food in the Last Year: Never true  Transportation Needs: No Transportation Needs (09/18/2022)   PRAPARE - Administrator, Civil Service (Medical): No    Lack of Transportation (Non-Medical): No  Physical Activity: Not on file  Stress: Not on file  Social Connections: Not on file  Intimate Partner Violence: Not on file    Review of Systems Per HPI  Objective:   Vitals:   11/08/22 1058  BP: 130/82  Pulse: 79  Resp: 16  Temp: 98.9 F (37.2 C)  TempSrc: Temporal  SpO2: 97%  Weight: 170 lb 6.4 oz (77.3 kg)  Height: 5\' 7"  (1.702 m)     Physical Exam Vitals reviewed.  Constitutional:      Appearance: Normal appearance. She is well-developed.  HENT:     Head: Normocephalic and atraumatic.  Eyes:     Conjunctiva/sclera: Conjunctivae normal.     Pupils: Pupils are equal, round, and reactive to light.  Neck:     Vascular: No carotid bruit.  Cardiovascular:     Rate and Rhythm: Normal rate and regular rhythm.     Heart sounds: Normal heart sounds.  Pulmonary:     Effort: Pulmonary effort is normal.     Breath sounds: Normal breath sounds.  Abdominal:     Palpations: Abdomen is soft. There is no pulsatile mass.     Tenderness: There is no abdominal tenderness.  Musculoskeletal:     Right lower leg: No edema.     Left lower leg: No edema.  Skin:    General: Skin is warm and dry.  Neurological:     General: No focal deficit present.     Mental Status: She is alert and oriented to person, place, and time.  Psychiatric:        Mood and Affect: Mood normal.        Behavior: Behavior normal.      Assessment & Plan:  LEONTINA SKIDMORE is a 57 y.o. female . Dizziness Intermittent lightheadedness  -Improved, denies recent symptoms.  Component of anxiety likely and is improved on sertraline, continue as below with  RTC precautions given.  Anxiety with depression - Plan: sertraline (ZOLOFT) 25 MG tablet  -Doing well with current dose sertraline, continue same, option of higher dosing if breakthrough symptoms.  Hyperlipidemia associated with type 2 diabetes mellitus - Plan: metFORMIN (GLUCOPHAGE) 500 MG tablet, Blood Glucose Monitoring Suppl DEVI, Glucose Blood (BLOOD GLUCOSE TEST STRIPS) STRP, Lancet Device MISC, Lancets Misc. MISC  -Start metformin for diabetes, recheck in 3 months.  Will hold on statin for now, diet/exercise approach with option of low-dose statin next visit, especially with diabetes.  Meds ordered this encounter  Medications   metFORMIN (GLUCOPHAGE) 500 MG tablet    Sig: Take 1 tablet (500 mg total) by mouth daily with breakfast.    Dispense:  90 tablet    Refill:  1   Blood Glucose Monitoring Suppl DEVI    Sig: 1 each by Does not apply route daily. May substitute to any manufacturer covered by patient's insurance.    Dispense:  1 each    Refill:  0   Glucose Blood (BLOOD GLUCOSE TEST STRIPS) STRP    Sig: 1 each by In Vitro route daily. May substitute to any manufacturer covered by patient's insurance.    Dispense:  100 strip    Refill:  0   Lancet Device MISC    Sig: 1 each by Does not apply route daily. May substitute to any manufacturer covered by patient's insurance.    Dispense:  1 each    Refill:  0   Lancets Misc. MISC    Sig: 1 each by Does not apply route daily. May substitute to any manufacturer  covered by patient's insurance.    Dispense:  100 each    Refill:  0   sertraline (ZOLOFT) 25 MG tablet    Sig: Take 1 tablet (25 mg total) by mouth daily.    Dispense:  90 tablet    Refill:  1   Patient Instructions  Glad to hear the dizziness has resolved and anxiety has improved. Continue sertraline daily.  Return to the clinic or go to the nearest emergency room if any of your symptoms worsen or new symptoms occur.  Cholesterol elevated but at a level we can  diet/exercise approach initially and repeat labs in  3 months. Keep up the good work.   Start metformin once per day for diabetes. See info below and recheck in 3 months. Check blood sugar up to once per day.   Take care!   Type 2 Diabetes Mellitus, Self-Care, Adult Caring for yourself after you have been diagnosed with type 2 diabetes (type 2 diabetes mellitus) means keeping your blood sugar (glucose) under control with a balance of: Nutrition. Exercise. Lifestyle changes. Medicines or insulin, if needed. Support from your team of health care providers and others. What are the risks? Having type 2 diabetes can put you at risk for other long-term (chronic) conditions, such as heart disease and kidney disease. Your health care provider may prescribe medicines to help prevent complications from diabetes. How to monitor your blood glucose  Check your blood glucose every day or as often as told by your health care provider. Have your A1C (hemoglobin A1C) level checked two or more times a year, or as often as told by your health care provider. Your health care provider will set personalized treatment goals for you. Generally, the goal of treatment is to maintain the following blood glucose levels: Before meals: 80-130 mg/dL (1.6-1.0 mmol/L). After meals: below 180 mg/dL (10 mmol/L). A1C level: less than 7%. How to manage hyperglycemia and hypoglycemia Hyperglycemia symptoms Hyperglycemia, also called high blood glucose, occurs when blood glucose is too high. Make sure you know the early signs of hyperglycemia, such as: Increased thirst. Hunger. Feeling very tired. Needing to urinate more often than usual. Blurry vision. Hypoglycemia symptoms Hypoglycemia, also called low blood glucose, occurs with a blood glucose level at or below 70 mg/dL (3.9 mmol/L). Diabetes medicines lower your blood glucose and can cause hypoglycemia. The risk for hypoglycemia increases during or after exercise,  during sleep, during illness, and when skipping meals or not eating for a long time (fasting). It is important to know the symptoms of hypoglycemia and treat it right away. Always have a 15-gram rapid-acting carbohydrate snack with you to treat low blood glucose. Family members and close friends should also know the symptoms and understand how to treat hypoglycemia, in case you are not able to treat yourself. Symptoms may include: Hunger. Anxiety. Sweating and feeling clammy. Dizziness or feeling light-headed. Sleepiness. Increased heart rate. Irritability. Tingling or numbness around the mouth, lips, or tongue. Restless sleep. Severe hypoglycemia is when your blood glucose level is at or below 54 mg/dL (3 mmol/L). Severe hypoglycemia is an emergency. Do not wait to see if the symptoms will go away. Get medical help right away. Call your local emergency services (911 in the [place] Do not drive yourself to the hospital. If you have severe hypoglycemia and you cannot eat or drink, you may need glucagon. A family member or close friend should learn how to check your blood glucose and how to give you glucagon.  Ask your health care provider if you need to have an emergency glucagon kit available. Follow these instructions at home: Medicines Take prescribed insulin or diabetes medicines as told by your health care provider. Do not run out of insulin or other diabetes medicines. Plan ahead so you always have these available. If you use insulin, adjust your dosage based on your physical activity and what foods you eat. Your health care provider will tell you how to adjust your dosage. Take over-the-counter and prescription medicines only as told by your health care provider. Eating and drinking  What you eat and drink affects your blood glucose and your insulin dosage. Making good choices helps to control your diabetes and prevent other health problems. A healthy meal plan includes eating lean  proteins, complex carbohydrates, fresh fruits and vegetables, low-fat dairy products, and healthy fats. Make an appointment to see a registered dietitian to help you create an eating plan that is right for you. Make sure that you: Follow instructions from your health care provider about eating or drinking restrictions. Drink enough fluid to keep your urine pale yellow. Keep a record of the carbohydrates that you eat. Do this by reading food labels and learning the standard serving sizes of foods. Follow your sick-day plan whenever you cannot eat or drink as usual. Make this plan in advance with your health care provider.  Activity Stay active. Exercise regularly, as told by your health care provider. This may include: Stretching and doing strength exercises, such as yoga or weight lifting, two or more times a week. Doing 150 minutes or more of moderate-intensity or vigorous-intensity exercise each week. This could be brisk walking, biking, or water aerobics. Spread out your activity over 3 or more days of the week. Do not go more than 2 days in a row without doing some kind of physical activity. When you start a new exercise or activity, work with your health care provider to adjust your insulin, medicines, or food intake as needed. Lifestyle Do not use any products that contain nicotine or tobacco. These products include cigarettes, chewing tobacco, and vaping devices, such as e-cigarettes. If you need help quitting, ask your health care provider. If you drink alcohol and your health care provider says that it is safe for you: Limit how much you have to: 0-1 drink a day for women who are not pregnant. 0-2 drinks a day for men. Know how much alcohol is in your drink. In the [place] one drink equals one 12 oz bottle of beer (355 mL), one 5 oz glass of wine (148 mL), or one 1 oz glass of hard liquor (44 mL). Learn to manage stress. If you need help with this, ask your health care provider. Take  care of your body  Keep your immunizations up to date. In addition to getting vaccinations as told by your health care provider, it is recommended that you get vaccinated against the following illnesses: The flu (influenza). Get a flu shot every year. Pneumonia. Hepatitis B. Schedule an eye exam soon after your diagnosis, and then one time every year after that. Check your skin and feet every day for cuts, bruises, redness, blisters, or sores. Schedule a foot exam with your health care provider once every year. Brush your teeth and gums two times a day, and floss one or more times a day. Visit your dentist one or more times every 6 months. Maintain a healthy weight. General instructions Share your diabetes management plan with people in your  workplace, school, and household. Carry a medical alert card or wear medical alert jewelry. Keep all follow-up visits. This is important. Questions to ask your health care provider Should I meet with a certified diabetes care and education specialist? Where can I find a support group for people with diabetes? Where to find more information For help and guidance and for more information about diabetes, please visit: American Diabetes Association (ADA): www.diabetes.org American Association of Diabetes Care and Education Specialists (ADCES): www.diabeteseducator.org International Diabetes Federation (IDF): DCOnly.dkwww.idf.org Summary Caring for yourself after you have been diagnosed with type 2 diabetes (type 2 diabetes mellitus) means keeping your blood sugar (glucose) under control with a balance of nutrition, exercise, lifestyle changes, and medicine. Check your blood glucose every day, as often as told by your health care provider. Having diabetes can put you at risk for other long-term (chronic) conditions, such as heart disease and kidney disease. Your health care provider may prescribe medicines to help prevent complications from diabetes. Share your  diabetes management plan with people in your workplace, school, and household. Keep all follow-up visits. This is important. This information is not intended to replace advice given to you by your health care provider. Make sure you discuss any questions you have with your health care provider. Document Revised: 12/14/2020 Document Reviewed: 12/14/2020 Elsevier Patient Education  2023 Elsevier Inc.       Signed,   Meredith StaggersJeffrey Kellie Chisolm, MD [facility], [facility] 11/08/22 11:47 AM

## 2022-11-11 ENCOUNTER — Encounter: Payer: Self-pay | Admitting: Family Medicine

## 2022-11-23 ENCOUNTER — Telehealth: Payer: Medicaid Other | Admitting: Family Medicine

## 2022-11-23 DIAGNOSIS — E1169 Type 2 diabetes mellitus with other specified complication: Secondary | ICD-10-CM

## 2022-11-23 MED ORDER — EMPAGLIFLOZIN 10 MG PO TABS: 10.0000 mg | ORAL_TABLET | Freq: Every day | ORAL | 2 refills | Status: DC

## 2022-11-23 NOTE — Telephone Encounter (Signed)
More information please.  When she says not working is she having side effects, elevated blood sugars?  Thanks.

## 2022-11-23 NOTE — Telephone Encounter (Signed)
Pt requesting RX change please advise

## 2022-11-23 NOTE — Telephone Encounter (Signed)
Caller name: AFREEN SIEBELS  On DPR?: Yes  Call back number: [phone] (mobile)  Provider they see: Shade Flood, MD  Reason for call: Pt states that metFORMIN (GLUCOPHAGE) 500 MG tablet  is not working for her. Can you prescribe Jardiance  the lowest dose.

## 2022-11-23 NOTE — Telephone Encounter (Signed)
Thank you for the additional information.  I absolutely agree, can stop metformin given the side effects and I will send in a low-dose of Jardiance.  Watch for urinary tract infection symptoms or yeast infection symptoms with that medication but that is unlikely.  Let me know if she has questions and follow-up as planned.

## 2022-11-23 NOTE — Addendum Note (Signed)
Addended by: Meredith Staggers R on: 11/23/2022 01:43 PM   Modules accepted: Orders

## 2022-11-23 NOTE — Telephone Encounter (Signed)
Spoke to the pt and she states she Is having diarrhea , and nausea with the Metformin .  States she does not like the feeling of this she can not stay of the bathroom

## 2022-11-23 NOTE — Telephone Encounter (Signed)
Left VM for pt stating to stop the metformin and that DR Neva Seat sent in low dose of Jardiacne . Advised to f/u as planned and to be cautious of any UTI or yeast infection symptoms

## 2022-11-27 ENCOUNTER — Other Ambulatory Visit: Payer: Self-pay

## 2022-11-27 DIAGNOSIS — D3A8 Other benign neuroendocrine tumors: Secondary | ICD-10-CM

## 2022-11-27 DIAGNOSIS — Z1211 Encounter for screening for malignant neoplasm of colon: Secondary | ICD-10-CM

## 2022-12-04 ENCOUNTER — Other Ambulatory Visit: Payer: Self-pay | Admitting: Family Medicine

## 2022-12-04 DIAGNOSIS — Z0000 Encounter for general adult medical examination without abnormal findings: Secondary | ICD-10-CM

## 2022-12-07 ENCOUNTER — Ambulatory Visit
Admission: RE | Admit: 2022-12-07 | Discharge: 2022-12-07 | Disposition: A | Discharge status: Home / Self Care | Payer: Medicaid Other | Source: Ambulatory Visit | Attending: Family Medicine | Admitting: Family Medicine

## 2022-12-07 DIAGNOSIS — Z0000 Encounter for general adult medical examination without abnormal findings: Secondary | ICD-10-CM

## 2022-12-21 ENCOUNTER — Ambulatory Visit (HOSPITAL_COMMUNITY)
Admission: RE | Admit: 2022-12-21 | Discharge: 2022-12-21 | Disposition: A | Discharge status: Home / Self Care | Payer: 59 | Source: Ambulatory Visit | Attending: Gastroenterology | Admitting: Gastroenterology

## 2022-12-21 DIAGNOSIS — D3A8 Other benign neuroendocrine tumors: Secondary | ICD-10-CM | POA: Diagnosis not present

## 2022-12-21 MED ORDER — IOHEXOL 300 MG/ML  SOLN
100.0000 mL | Freq: Once | INTRAMUSCULAR | Status: AC | PRN
  Administered 2022-12-21: 100 mL via INTRAVENOUS

## 2022-12-25 LAB — POCT I-STAT CREATININE: Creatinine, Ser: 1 mg/dL (ref 0.44–1.00)

## 2023-01-01 ENCOUNTER — Encounter (HOSPITAL_COMMUNITY): Payer: Self-pay | Admitting: Gastroenterology

## 2023-01-01 NOTE — Progress Notes (Signed)
Attempted to obtain medical history via telephone, unable to reach at this time. HIPAA compliant voicemail message left requesting return call to pre surgical testing department. 

## 2023-01-04 ENCOUNTER — Telehealth: Payer: Self-pay

## 2023-01-04 NOTE — Telephone Encounter (Signed)
Per pharmacy Jardiance requires PA.  PA started via CMM Key: [id number] Status Sent to Plan on May 7  Per [facility] [phone] the PA was closed as their system shows patient has other primary insurance coverage - [employer]. Patient will need to contact Member Services to advise whether or not she still has this coverage.   Member Services [phone]  Also, to restart the PA they will need OV notes and lab results.   [facility] has faxed PA information to office.   Per [facility] they do show patient has [employer] AND MCD in her profile. They are unable to run the rx as they do not currently have it in stock. They have no record of patient picking up the rx.   Please follow up with patient regarding insurance coverage. She will need to contact number listed above to speak with [facility] and confirm/deny coverage with [employer]. After this has been completed PA process can continue.

## 2023-01-08 ENCOUNTER — Encounter (HOSPITAL_COMMUNITY)
Admission: RE | Disposition: A | Discharge status: Home / Self Care | Payer: Self-pay | Source: Home / Self Care | Attending: Gastroenterology

## 2023-01-08 ENCOUNTER — Other Ambulatory Visit: Payer: Self-pay

## 2023-01-08 ENCOUNTER — Ambulatory Visit (HOSPITAL_BASED_OUTPATIENT_CLINIC_OR_DEPARTMENT_OTHER): Payer: 59 | Admitting: Anesthesiology

## 2023-01-08 ENCOUNTER — Ambulatory Visit (HOSPITAL_COMMUNITY): Payer: 59 | Admitting: Anesthesiology

## 2023-01-08 ENCOUNTER — Ambulatory Visit (HOSPITAL_COMMUNITY)
Admission: RE | Admit: 2023-01-08 | Discharge: 2023-01-08 | Disposition: A | Discharge status: Home / Self Care | Payer: 59 | Attending: Gastroenterology | Admitting: Gastroenterology

## 2023-01-08 ENCOUNTER — Encounter (HOSPITAL_COMMUNITY): Payer: Self-pay | Admitting: Gastroenterology

## 2023-01-08 DIAGNOSIS — I899 Noninfective disorder of lymphatic vessels and lymph nodes, unspecified: Secondary | ICD-10-CM | POA: Diagnosis not present

## 2023-01-08 DIAGNOSIS — K6289 Other specified diseases of anus and rectum: Secondary | ICD-10-CM | POA: Insufficient documentation

## 2023-01-08 DIAGNOSIS — D3A8 Other benign neuroendocrine tumors: Secondary | ICD-10-CM

## 2023-01-08 DIAGNOSIS — Z7984 Long term (current) use of oral hypoglycemic drugs: Secondary | ICD-10-CM | POA: Insufficient documentation

## 2023-01-08 DIAGNOSIS — I10 Essential (primary) hypertension: Secondary | ICD-10-CM | POA: Diagnosis not present

## 2023-01-08 DIAGNOSIS — K641 Second degree hemorrhoids: Secondary | ICD-10-CM

## 2023-01-08 DIAGNOSIS — E1149 Type 2 diabetes mellitus with other diabetic neurological complication: Secondary | ICD-10-CM

## 2023-01-08 DIAGNOSIS — K644 Residual hemorrhoidal skin tags: Secondary | ICD-10-CM | POA: Insufficient documentation

## 2023-01-08 DIAGNOSIS — F17200 Nicotine dependence, unspecified, uncomplicated: Secondary | ICD-10-CM | POA: Diagnosis not present

## 2023-01-08 DIAGNOSIS — F17210 Nicotine dependence, cigarettes, uncomplicated: Secondary | ICD-10-CM | POA: Diagnosis not present

## 2023-01-08 DIAGNOSIS — E1165 Type 2 diabetes mellitus with hyperglycemia: Secondary | ICD-10-CM | POA: Insufficient documentation

## 2023-01-08 DIAGNOSIS — L905 Scar conditions and fibrosis of skin: Secondary | ICD-10-CM | POA: Diagnosis not present

## 2023-01-08 DIAGNOSIS — K5730 Diverticulosis of large intestine without perforation or abscess without bleeding: Secondary | ICD-10-CM

## 2023-01-08 DIAGNOSIS — J449 Chronic obstructive pulmonary disease, unspecified: Secondary | ICD-10-CM | POA: Diagnosis not present

## 2023-01-08 DIAGNOSIS — Z9889 Other specified postprocedural states: Secondary | ICD-10-CM | POA: Diagnosis not present

## 2023-01-08 DIAGNOSIS — Z1211 Encounter for screening for malignant neoplasm of colon: Secondary | ICD-10-CM

## 2023-01-08 HISTORY — PX: HEMOSTASIS CLIP PLACEMENT: SHX6857

## 2023-01-08 HISTORY — PX: FLEXIBLE SIGMOIDOSCOPY: SHX5431

## 2023-01-08 HISTORY — PX: SUBMUCOSAL LIFTING INJECTION: SHX6855

## 2023-01-08 HISTORY — PX: EUS: SHX5427

## 2023-01-08 HISTORY — PX: ENDOSCOPIC MUCOSAL RESECTION: SHX6839

## 2023-01-08 LAB — GLUCOSE, CAPILLARY: Glucose-Capillary: 105 mg/dL — ABNORMAL HIGH (ref 70–99)

## 2023-01-08 SURGERY — ULTRASOUND, LOWER GI TRACT, ENDOSCOPIC
Anesthesia: Monitor Anesthesia Care

## 2023-01-08 MED ORDER — SODIUM CHLORIDE 0.9 % IV SOLN: INTRAVENOUS | Status: DC

## 2023-01-08 MED ORDER — LACTATED RINGERS IV SOLN: INTRAVENOUS | Status: DC

## 2023-01-08 MED ORDER — IBUPROFEN 600 MG PO TABS: 600.0000 mg | ORAL_TABLET | Freq: Four times a day (QID) | ORAL | 0 refills | Status: DC | PRN

## 2023-01-08 MED ORDER — PHENYLEPHRINE HCL (PRESSORS) 10 MG/ML IV SOLN
INTRAVENOUS | Status: DC | PRN
  Administered 2023-01-08: 80 ug via INTRAVENOUS
  Administered 2023-01-08: 160 ug via INTRAVENOUS

## 2023-01-08 MED ORDER — PROPOFOL 500 MG/50ML IV EMUL
INTRAVENOUS | Status: DC | PRN
  Administered 2023-01-08: 150 ug/kg/min via INTRAVENOUS

## 2023-01-08 NOTE — Anesthesia Preprocedure Evaluation (Addendum)
Anesthesia Evaluation  Patient identified by MRN, date of birth, ID band Patient awake    Reviewed: Allergy & Precautions, NPO status , Patient's Chart, lab work & pertinent test results  History of Anesthesia Complications Negative for: history of anesthetic complications  Airway Mallampati: III  TM Distance: >3 FB Neck ROM: Full    Dental  (+) Upper Dentures, Lower Dentures, Dental Advisory Given   Pulmonary neg shortness of breath, asthma , neg sleep apnea, COPD, neg recent URI, Current Smoker   Pulmonary exam normal breath sounds clear to auscultation       Cardiovascular hypertension, (-) angina (-) Past MI, (-) Cardiac Stents and (-) CABG (-) dysrhythmias  Rhythm:Regular Rate:Normal     Neuro/Psych neg Seizures vertigo  Neuromuscular disease (Bell's palsy)    GI/Hepatic negative GI ROS, Neg liver ROS,,,  Endo/Other  diabetes (Hgb A1c 7.5), Poorly Controlled, Type 2, Oral Hypoglycemic Agents    Renal/GU negative Renal ROS     Musculoskeletal   Abdominal   Peds  Hematology  (+) Blood dyscrasia, anemia   Anesthesia Other Findings   Reproductive/Obstetrics                             Anesthesia Physical Anesthesia Plan  ASA: 3  Anesthesia Plan: MAC   Post-op Pain Management:    Induction: Intravenous  PONV Risk Score and Plan: 1 and Propofol infusion and Treatment may vary due to age or medical condition  Airway Management Planned: Natural Airway and Nasal Cannula  Additional Equipment:   Intra-op Plan:   Post-operative Plan:   Informed Consent: I have reviewed the patients History and Physical, chart, labs and discussed the procedure including the risks, benefits and alternatives for the proposed anesthesia with the patient or authorized representative who has indicated his/her understanding and acceptance.     Dental advisory given  Plan Discussed with: CRNA and  Anesthesiologist  Anesthesia Plan Comments: (Had 8 ounces of water at 9:15. Will wait until NPO time of 11:15.  Discussed with patient risks of MAC including, but not limited to, minor pain or discomfort, hearing people in the room, and possible need for backup general anesthesia. Risks for general anesthesia also discussed including, but not limited to, sore throat, hoarse voice, chipped/damaged teeth, injury to vocal cords, nausea and vomiting, allergic reactions, lung infection, heart attack, stroke, and death. All questions answered. )        Anesthesia Quick Evaluation

## 2023-01-08 NOTE — H&P (Signed)
GASTROENTEROLOGY PROCEDURE H&P NOTE   Primary Care Physician: Shade Flood, MD  HPI: Brandi Cooke is a 57 y.o. 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  Past Medical History:  Diagnosis Date   Allergy    Anemia    Bell's palsy    Fibroid    Glaucoma    Open   Hypertension    Vertigo    Past Surgical History:  Procedure Laterality Date   BREAST EXCISIONAL BIOPSY Left    BREAST LUMPECTOMY WITH RADIOACTIVE SEED LOCALIZATION Left 01/12/2014   Procedure: LEFT BREAST LUMPECTOMY WITH RADIOACTIVE SEED LOCALIZATION;  Surgeon: Clovis Pu. Cornett, MD;  Location: [facility];  Service: General;  Laterality: Left;   BREAST SURGERY     Cyst removed   HYSTEROSCOPY  2002   myomectomy   MYOMECTOMY  10/2005   PELVIC LAPAROSCOPY     Expl. Lap.   REFRACTIVE SURGERY  07/26/2022   Current Facility-Administered Medications  Medication Dose Route Frequency Provider Last Rate Last Admin   0.9 %  sodium chloride infusion   Intravenous Continuous Mansouraty, Netty Starring., MD       lactated ringers infusion   Intravenous Continuous Mansouraty, Netty Starring., MD 10 mL/hr at 01/08/23 1009 New Bag at 01/08/23 1009    Current Facility-Administered Medications:    0.9 %  sodium chloride infusion, , Intravenous, Continuous, Mansouraty, Netty Starring., MD   lactated ringers infusion, , Intravenous, Continuous, Mansouraty, Netty Starring., MD, Last Rate: 10 mL/hr at 01/08/23 1009, New Bag at 01/08/23 1009 Allergies  Allergen Reactions   Latex Itching and Rash   Metformin And Related Other (See Comments)    Diarrhea    Family History  Problem Relation Age of Onset   Hypertension Mother    Diabetes Mother    Stroke Brother    Migraines Brother    Migraines Brother    Heart disease Brother    Thyroid disease Brother    Cancer Maternal Uncle         Unknown type   Colon polyps Neg Hx    Colon cancer Neg Hx    Esophageal cancer Neg Hx    Stomach cancer Neg Hx    Rectal cancer Neg Hx    Social History   Socioeconomic History   Marital status: Single    Spouse name: Not on file   Number of children: Not on file   Years of education: Not on file   Highest education level: Not on file  Occupational History   Not on file  Tobacco Use   Smoking status: Every Day    Packs/day: .5    Types: Cigarettes   Smokeless tobacco: Never  Vaping Use   Vaping Use: Never used  Substance and Sexual Activity   Alcohol use: Not Currently    Alcohol/week: 5.0 standard drinks of alcohol    Types: 5 Cans of beer per week   Drug use: No   Sexual activity: Not Currently    Comment: 1st intercourse 57 yo-Fewer than 5 partners  Other Topics Concern   Not on file  Social History Narrative   Not on file   Social Determinants of Health   Financial Resource Strain: Not on file  Food Insecurity: No Food Insecurity (09/18/2022)   Hunger Vital Sign    Worried About Running Out of Food in the Last Year: Never true  Ran Out of Food in the Last Year: Never true  Transportation Needs: No Transportation Needs (09/18/2022)   PRAPARE - Administrator, Civil Service (Medical): No    Lack of Transportation (Non-Medical): No  Physical Activity: Not on file  Stress: Not on file  Social Connections: Not on file  Intimate Partner Violence: Not on file    Physical Exam: Today's Vitals   01/08/23 0946  BP: 138/87  Pulse: 66  Resp: 18  Temp: (!) 97.2 F (36.2 C)  TempSrc: Temporal  SpO2: 99%  Weight: 76.7 kg  Height: 5\' 7"  (1.702 m)  PainSc: 0-No pain   Body mass index is 26.47 kg/m. GEN: NAD EYE: Sclerae anicteric ENT: MMM CV: Non-tachycardic GI: Soft, NT/ND NEURO:  Alert & Oriented x 3  Lab Results: No results for input(s): "WBC", "HGB", "HCT", "PLT" in the last 72 hours. BMET No results for input(s): "NA", "K", "CL",  "CO2", "GLUCOSE", "BUN", "CREATININE", "CALCIUM" in the last 72 hours. LFT No results for input(s): "PROT", "ALBUMIN", "AST", "ALT", "ALKPHOS", "BILITOT", "BILIDIR", "IBILI" in the last 72 hours. PT/INR No results for input(s): "LABPROT", "INR" in the last 72 hours.   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  The risks of an EUS including intestinal perforation, bleeding, infection, aspiration, and medication effects were discussed as was the possibility it may not give a definitive diagnosis if a biopsy is performed.    The risks and benefits of endoscopic evaluation/treatment were discussed with the patient and/or family; these include but are not limited to the risk of perforation, infection, bleeding, missed lesions, lack of diagnosis, severe illness requiring hospitalization, as well as anesthesia and sedation related illnesses.  The patient's history has been reviewed, patient examined, no change in status, and deemed stable for procedure.  The patient and/or family is agreeable to proceed.    Corliss Parish, MD [facility] Office # [phone]

## 2023-01-08 NOTE — Transfer of Care (Signed)
Immediate Anesthesia Transfer of Care Note  Patient: Brandi Cooke  Procedure(s) Performed: LOWER ENDOSCOPIC ULTRASOUND (EUS) ENDOSCOPIC MUCOSAL RESECTION FLEXIBLE SIGMOIDOSCOPY SUBMUCOSAL LIFTING INJECTION HEMOSTASIS CLIP PLACEMENT  Patient Location: PACU  Anesthesia Type:MAC  Level of Consciousness: sedated, patient cooperative, and responds to stimulation  Airway & Oxygen Therapy: Patient Spontanous Breathing and Patient connected to face mask oxygen  Post-op Assessment: Report given to RN and Post -op Vital signs reviewed and stable  Post vital signs: Reviewed and stable  Last Vitals:  Vitals Value Taken Time  BP    Temp    Pulse    Resp    SpO2      Last Pain:  Vitals:   01/08/23 0946  TempSrc: Temporal  PainSc: 0-No pain         Complications: No notable events documented.

## 2023-01-08 NOTE — Anesthesia Postprocedure Evaluation (Signed)
Anesthesia Post Note  Patient: YANELIZ RADEBAUGH  Procedure(s) Performed: LOWER ENDOSCOPIC ULTRASOUND (EUS) ENDOSCOPIC MUCOSAL RESECTION FLEXIBLE SIGMOIDOSCOPY SUBMUCOSAL LIFTING INJECTION HEMOSTASIS CLIP PLACEMENT     Patient location during evaluation: PACU Anesthesia Type: MAC Level of consciousness: awake Pain management: pain level controlled Vital Signs Assessment: post-procedure vital signs reviewed and stable Respiratory status: spontaneous breathing, nonlabored ventilation and respiratory function stable Cardiovascular status: stable and blood pressure returned to baseline Postop Assessment: no apparent nausea or vomiting Anesthetic complications: no   No notable events documented.  Last Vitals:  Vitals:   01/08/23 1235 01/08/23 1240  BP: (!) 125/99 (!) 125/96  Pulse: 68 (!) 57  Resp: 16 17  Temp:    SpO2: 96% 95%    Last Pain:  Vitals:   01/08/23 1240  TempSrc:   PainSc: 0-No pain                 Linton Rump

## 2023-01-08 NOTE — Op Note (Signed)
[facility] Patient Name: Brandi Cooke Procedure Date: 01/08/2023 MRN: [id number] Attending MD: Corliss Parish , MD, [id number] Date of Birth: 1965/12/03 CSN: [id number] Age: 57 Admit Type: Outpatient Procedure:                Lower EUS Indications:              Rectal deformity found on endoscopy; subepithelial                            tumor versus extrinsic compression, Neuroendocrine                            Tumor Providers:                Corliss Parish, MD, Fransisca Connors, Priscella Mann, Technician Referring MD:             Tressia Danas MD, MD, Asencion Partridge. Neva Seat, MD Medicines:                Monitored Anesthesia Care Complications:            No immediate complications. Estimated Blood Loss:     Estimated blood loss was minimal. Procedure:                Pre-Anesthesia Assessment:                           - Prior to the procedure, a History and Physical                            was performed, and patient medications and                            allergies were reviewed. The patient's tolerance of                            previous anesthesia was also reviewed. The risks                            and benefits of the procedure and the sedation                            options and risks were discussed with the patient.                            All questions were answered, and informed consent                            was obtained. Prior Anticoagulants: The patient has                            taken no anticoagulant or antiplatelet agents  except for NSAID medication. ASA Grade Assessment:                            III - A patient with severe systemic disease. After                            reviewing the risks and benefits, the patient was                            deemed in satisfactory condition to undergo the                            procedure.                            After obtaining informed consent, the endoscope was                            passed under direct vision. Throughout the                            procedure, the patient's blood pressure, pulse, and                            oxygen saturations were monitored continuously. The                            [id number] ([id number]) Olympus therapeutic endoscope                            was introduced through the anus and advanced to the                            the descending colon. The [id number] ([id number])                            Olympus radial ultrasound scope was introduced                            through the anus and advanced to the the sigmoid                            colon for ultrasound. The lower EUS was                            accomplished without difficulty. The patient                            tolerated the procedure. Scope In: 11:30:53 AM Scope Out: 12:04:10 PM Total Procedure Duration: 0 hours 33 minutes 17 seconds  Findings:      Skin tags were found on perianal exam.      The digital rectal exam findings include hemorrhoids. Pertinent       negatives include no palpable rectal lesions.  ENDOSCOPIC FINDING: :      Multiple small-mouthed diverticula were found in the recto-sigmoid colon       and sigmoid colon.      A medium post polypectomy scar was found in the distal rectum. There was       no evidence of the previous polyp. Preparations were made for mucosal       resection. Demarcation of the lesion was performed with high-definition       white light and narrow band imaging to clearly identify the boundaries       of the lesion. EverLift was injected to raise the lesion. Band ligator       and snare mucosal resection was performed. Resection and retrieval were       complete. Resected tissue margins were examined and clear of polyp       tissue. To prevent bleeding after mucosal resection, three hemostatic       clips were successfully placed (MR  conditional). Clip manufacturer:       [employer]. There was no bleeding during, or at the end, of the       procedure.      Normal mucosa was found in the visualized colon.      Non-bleeding non-thrombosed internal hemorrhoids were found during       retroflexion, during perianal exam and during digital exam. The       hemorrhoids were Grade II (internal hemorrhoids that prolapse but reduce       spontaneously).      ENDOSONOGRAPHIC FINDING: :      Local wall thickening was found in the rectum. This was encountered from       5 to 6 cm (from the anal verge). This finding appeared to be primarily       due to increased thickness of the deep mucosa (Layer 2). This was at the       scar site. No evidence of overt intramural lesion persea.      The rectum was otherwise normal.      No malignant-appearing lymph nodes were visualized in the perirectal       region and in the left iliac region. The nodes were.      The perirectal space was normal.      The internal anal sphincter was visualized endosonographically and       appeared normal. Impression:               FLEX Impression:                           - Perianal skin tags found on perianal exam.                            Hemorrhoids found on digital rectal exam.                           - Diverticulosis in the recto-sigmoid colon and in                            the sigmoid colon.                           - Post-polypectomy scar in the distal rectum. After  EUS completed, EMR performed with Cap-Band-Lift                            resection. Clips (MR conditional) were placed. Clip                            manufacturer: [employer].                           - Normal mucosa in the rest of the visualized colon.                           - Non-bleeding non-thrombosed internal hemorrhoids.                           EUS Impression:                           - Local wall thickening was  visualized                            endosonographically in the rectum. This was due to                            increased thickness of the deep mucosa (Layer 2).                            This was at scar site.                           - Endosonographic images of the rectum were                            unremarkable.                           - No malignant-appearing lymph nodes were                            visualized endosonographically in the perirectal                            region and in the left iliac region.                           - Endosonographic images of the perirectal space                            were unremarkable.                           - The internal anal sphincter was visualized                            endosonographically and appeared normal. Moderate Sedation:      Not Applicable - Patient had care per Anesthesia.  Recommendation:           - The patient will be observed post-procedure,                            until all discharge criteria are met.                           - Discharge patient to home.                           - Patient has a contact number available for                            emergencies. The signs and symptoms of potential                            delayed complications were discussed with the                            patient. Return to normal activities tomorrow.                            Written discharge instructions were provided to the                            patient.                           - High fiber diet.                           - Use FiberCon 1-2 tablets PO daily.                           - Await path results.                           - Pending final pathology will dictate if we pursue                            a Lower EUS in 1-2 years with plan for 5-year                            followup to ensure no other issues develop.                           - The findings and recommendations were  discussed                            with the patient.                           - The findings and recommendations were discussed  with the patient's family. Procedure Code(s):        --- Professional ---                           [phone], Sigmoidoscopy, flexible; with endoscopic                            mucosal resection                           45341, Sigmoidoscopy, flexible; with endoscopic                            ultrasound examination Diagnosis Code(s):        --- Professional ---                           K64.1, Second degree hemorrhoids                           Z98.890, Other specified postprocedural states                           K62.89, Other specified diseases of anus and rectum                           I89.9, Noninfective disorder of lymphatic vessels                            and lymph nodes, unspecified                           K64.4, Residual hemorrhoidal skin tags                           K57.30, Diverticulosis of large intestine without                            perforation or abscess without bleeding CPT copyright 2022 American [employer] Association. All rights reserved. The codes documented in this report are preliminary and upon coder review may  be revised to meet current compliance requirements. Corliss Parish, MD 01/08/2023 12:16:41 PM Number of Addenda: 0

## 2023-01-08 NOTE — Discharge Instructions (Signed)
YOU HAD AN ENDOSCOPIC PROCEDURE TODAY: Refer to the procedure report and other information in the discharge instructions given to you for any specific questions about what was found during the examination. If this information does not answer your questions, please call [place] office at [phone] to clarify.  ° °YOU SHOULD EXPECT: Some feelings of bloating in the abdomen. Passage of more gas than usual. Walking can help get rid of the air that was put into your GI tract during the procedure and reduce the bloating. If you had a lower endoscopy (such as a colonoscopy or flexible sigmoidoscopy) you may notice spotting of blood in your stool or on the toilet paper. Some abdominal soreness may be present for a day or two, also. ° °DIET: Your first meal following the procedure should be a light meal and then it is ok to progress to your normal diet. A half-sandwich or bowl of soup is an example of a good first meal. Heavy or fried foods are harder to digest and may make you feel nauseous or bloated. Drink plenty of fluids but you should avoid alcoholic beverages for 24 hours. If you had a esophageal dilation, please see attached instructions for diet.   ° °ACTIVITY: Your care partner should take you home directly after the procedure. You should plan to take it easy, moving slowly for the rest of the day. You can resume normal activity the day after the procedure however YOU SHOULD NOT DRIVE, use power tools, machinery or perform tasks that involve climbing or major physical exertion for 24 hours (because of the sedation medicines used during the test).  ° °SYMPTOMS TO REPORT IMMEDIATELY: °A gastroenterologist can be reached at any hour. Please call [phone]  for any of the following symptoms:  °Following lower endoscopy (colonoscopy, flexible sigmoidoscopy) °Excessive amounts of blood in the stool  °Significant tenderness, worsening of abdominal pains  °Swelling of the abdomen that is new, acute  °Fever of 100° or  higher  °Following upper endoscopy (EGD, EUS, ERCP, esophageal dilation) °Vomiting of blood or coffee ground material  °New, significant abdominal pain  °New, significant chest pain or pain under the shoulder blades  °Painful or persistently difficult swallowing  °New shortness of breath  °Black, tarry-looking or red, bloody stools ° °FOLLOW UP:  °If any biopsies were taken you will be contacted by phone or by letter within the next 1-3 weeks. Call [phone]  if you have not heard about the biopsies in 3 weeks.  °Please also call with any specific questions about appointments or follow up tests. ° °

## 2023-01-10 ENCOUNTER — Encounter: Payer: Self-pay | Admitting: Gastroenterology

## 2023-01-10 LAB — SURGICAL PATHOLOGY

## 2023-01-13 ENCOUNTER — Encounter (HOSPITAL_COMMUNITY): Payer: Self-pay | Admitting: Gastroenterology

## 2023-02-04 ENCOUNTER — Other Ambulatory Visit: Payer: Self-pay | Admitting: Family Medicine

## 2023-02-04 DIAGNOSIS — E1169 Type 2 diabetes mellitus with other specified complication: Secondary | ICD-10-CM

## 2023-02-06 ENCOUNTER — Telehealth: Payer: Self-pay | Admitting: Gastroenterology

## 2023-02-06 NOTE — Telephone Encounter (Signed)
Calling to discuss EUS on 6/11 please advise

## 2023-02-06 NOTE — Telephone Encounter (Signed)
Called and spoke with patient. We reviewed 01/10/23 letter that was sent to patient with results. Pt did not receive letter, will mail today. Pt is aware that she will need repeat colonoscopy in 1 year. Pt verbalized understanding and had no concerns at the end of the call.

## 2023-02-08 ENCOUNTER — Ambulatory Visit: Payer: Medicaid Other | Admitting: Family Medicine

## 2023-02-15 DIAGNOSIS — H401133 Primary open-angle glaucoma, bilateral, severe stage: Secondary | ICD-10-CM | POA: Diagnosis not present

## 2023-02-28 ENCOUNTER — Ambulatory Visit: Payer: 59 | Admitting: Family Medicine

## 2023-02-28 ENCOUNTER — Encounter: Payer: Self-pay | Admitting: Family Medicine

## 2023-02-28 VITALS — BP 120/78 | HR 87 | Temp 98.8°F | Ht 67.0 in | Wt 163.2 lb

## 2023-02-28 DIAGNOSIS — E785 Hyperlipidemia, unspecified: Secondary | ICD-10-CM | POA: Diagnosis not present

## 2023-02-28 DIAGNOSIS — R140 Abdominal distension (gaseous): Secondary | ICD-10-CM

## 2023-02-28 DIAGNOSIS — E1165 Type 2 diabetes mellitus with hyperglycemia: Secondary | ICD-10-CM

## 2023-02-28 DIAGNOSIS — E1169 Type 2 diabetes mellitus with other specified complication: Secondary | ICD-10-CM | POA: Diagnosis not present

## 2023-02-28 DIAGNOSIS — F418 Other specified anxiety disorders: Secondary | ICD-10-CM | POA: Diagnosis not present

## 2023-02-28 MED ORDER — ATORVASTATIN CALCIUM 10 MG PO TABS
10.0000 mg | ORAL_TABLET | Freq: Every day | ORAL | 0 refills | Status: DC
Start: 2023-02-28 — End: 2023-09-26

## 2023-02-28 NOTE — Patient Instructions (Addendum)
Daily sertraline with food is typically recommended. Glad you are doing ok , but as we discussed you can take that medication every day.  Cholesterol is elevated and with diabetes you should be on a statin medication.  I will write for Lipitor, start with 1 day/week and if you tolerate that dose can increase by an additional day per week all the way up to every day if it is tolerated.  We can see how things are going in 3 weeks when we discuss labs further at that time.  Keep up the good work with diet and exercise, and congratulations on the weight loss since last visit!  Stop metformin.  See information below on foods that can cause increased gas or bloating.  Will discuss this further in 3 weeks and if you continue to have bloating symptoms off of the metformin we can look at other causes.  Depending on blood sugar test today we will discuss other meds, but if I do prescribe a medicine that is too costly, please let me know so I can make a change.  Abdominal Bloating When you have abdominal bloating, your abdomen may feel full, tight, or painful. It may also look bigger than normal or swollen (distended). Common causes of abdominal bloating include: Swallowing air. Constipation. Problems digesting food. Eating too much. Irritable bowel syndrome. This is a condition that affects the large intestine. Lactose intolerance. This is an inability to digest lactose, a natural sugar in dairy products. Celiac disease. This is a condition that affects the ability to digest gluten, a protein found in some grains. Gastroparesis. This is a condition that slows down the movement of food in the stomach and small intestine. It is more common in people with diabetes mellitus. Gastroesophageal reflux disease (GERD). This is a condition that makes stomach acid flow back into the esophagus. Urinary retention. This means that the body is holding onto urine, and the bladder cannot be emptied all the way. Follow these  instructions at home: Eating and drinking Avoid eating too much. Try not to swallow air while talking or eating. Avoid eating while lying down. Avoid these foods and drinks: Foods that cause gas, such as broccoli, cabbage, cauliflower, and baked beans. Carbonated drinks. Hard candy. Chewing gum. Medicines Take over-the-counter and prescription medicines only as told by your health care provider. Take probiotic medicines. These medicines contain live bacteria or yeasts that can help digestion. Take coated peppermint oil capsules. General instructions Try to exercise regularly. Exercise may help to relieve bloating that is caused by gas and relieve constipation. Keep all follow-up visits. This is important. Contact a health care provider if: You have nausea and vomiting. You have diarrhea. You have abdominal pain. You have unusual weight loss or weight gain. You have severe pain, and medicines do not help. Get help right away if: You have chest pain. You have trouble breathing. You have shortness of breath. You have trouble urinating. You have darker urine than normal. You have blood in your stools or have dark, tarry stools. These symptoms may represent a serious problem that is an emergency. Do not wait to see if the symptoms will go away. Get medical help right away. Call your local emergency services (911 in the [place] Do not drive yourself to the hospital. Summary Abdominal bloating means that the abdomen is swollen. Common causes of abdominal bloating are swallowing air, constipation, and problems digesting food. Avoid eating too much and avoid swallowing air. Avoid foods that cause gas, carbonated  drinks, hard candy, and chewing gum. This information is not intended to replace advice given to you by your health care provider. Make sure you discuss any questions you have with your health care provider. Document Revised: 02/16/2020 Document Reviewed: 02/16/2020 Elsevier  Patient Education  2024 [employer].

## 2023-02-28 NOTE — Progress Notes (Signed)
Subjective:  Patient ID: Brandi Cooke, female    DOB: [birth year]  Age: 57 y.o. MRN: [id number]  CC:  Chief Complaint  Patient presents with   Medical Management of Chronic Issues    Pt is doing     HPI Brandi Cooke presents for   Anxiety Treated with sertraline, improving at her April visit.  Sertraline 25 mg daily. Still working well, only taking as needed - only once or twice per week. Would like to stay on that med.     02/28/2023    1:36 PM 11/08/2022   11:05 AM 10/04/2022    1:32 PM 01/04/2022    3:20 PM  GAD 7 : Generalized Anxiety Score  Nervous, Anxious, on Edge 0 0 1 1  Control/stop worrying 0 0 0 1  Worry too much - different things 0 0 0 1  Trouble relaxing 1 1 1 1   Restless 0 0 0 1  Easily annoyed or irritable 0 1 0 1  Afraid - awful might happen 0 0 0 1  Total GAD 7 Score 1 2 2 7   Anxiety Difficulty    Somewhat difficult       02/28/2023    1:35 PM 11/08/2022   11:04 AM 10/04/2022    1:32 PM 02/15/2022    3:28 PM 01/04/2022    3:18 PM  Depression screen PHQ 2/9  Decreased Interest 1 2 1 2 1   Down, Depressed, Hopeless 1 2 1 2 1   PHQ - 2 Score 2 4 2 4 2   Altered sleeping 2 3 1 2 1   Tired, decreased energy 0 0 0 2 1  Change in appetite 0 0 0 2 0  Feeling bad or failure about yourself  1 3 1 2  0  Trouble concentrating 0 0 0 2 0  Moving slowly or fidgety/restless 0 0 0 2 0  Suicidal thoughts 0 0  0 0  PHQ-9 Score 5 10 4 16 4   Difficult doing work/chores  Not difficult at all   Somewhat difficult     Hyperlipidemia: No current statin.  Slight elevated ASCVD score as below, as well as history of diabetes, statins have been recommended.  She was improving her diet last visit, wanted to defer medication at that time, and on repeat discussion and labs today. No prior statin. Trying to drink more water, exercise at house. Has lost 6#. Commended on this improvement.   The 10-year ASCVD risk score (Arnett DK, et al., 2019) is: 13%   Values used to calculate  the score:     Age: 45 years     Sex: Female     Is Non-Hispanic African American: Yes     Diabetic: Yes     Tobacco smoker: Yes     Systolic Blood Pressure: 120 mmHg     Is BP treated: No     HDL Cholesterol: 51.6 mg/dL     Total Cholesterol: 233 mg/dL  Lab Results  Component Value Date   CHOL 233 (H) 10/04/2022   HDL 51.60 10/04/2022   LDLCALC 145 (H) 10/04/2022   TRIG 183.0 (H) 10/04/2022   CHOLHDL 5 10/04/2022   Lab Results  Component Value Date   ALT 20 10/04/2022   AST 21 10/04/2022   ALKPHOS 75 10/04/2022   BILITOT 0.4 10/04/2022   Diabetes: With hyperglycemia, A1c had increased in March.  Initially started on metformin, intolerant due to gastrointestinal side effects, changed to Jardiance, 10 mg daily. Jardiance too  expensive.  Taking metformin 2 times per week - stomach upset, diarrhea, gas and bloating with metformin. No recent use. Some bloating. Not sure if it was from metformin. No n/v. Some increased gas at times.  Microalbumin: today.  No recent home readings.  Blood sugar 170 few weeks ago.   Lab Results  Component Value Date   HGBA1C 7.5 (H) 10/04/2022   HGBA1C 6.8 (H) 11/16/2021   Lab Results  Component Value Date   LDLCALC 145 (H) 10/04/2022   CREATININE 1.00 12/21/2022   Wt Readings from Last 3 Encounters:  02/28/23 163 lb 3.2 oz (74 kg)  01/08/23 169 lb (76.7 kg)  11/08/22 170 lb 6.4 oz (77.3 kg)     History Patient Active Problem List   Diagnosis Date Noted   Granular cell tumor 12/07/2013   Fibroid    Bell's palsy    Glaucoma    Past Medical History:  Diagnosis Date   Allergy    Anemia    Bell's palsy    Fibroid    Glaucoma    Open   Hypertension    Vertigo    Past Surgical History:  Procedure Laterality Date   BREAST EXCISIONAL BIOPSY Left    BREAST LUMPECTOMY WITH RADIOACTIVE SEED LOCALIZATION Left 01/12/2014   Procedure: LEFT BREAST LUMPECTOMY WITH RADIOACTIVE SEED LOCALIZATION;  Surgeon: Maisie Fus A. Cornett, MD;   Location: [facility];  Service: General;  Laterality: Left;   BREAST SURGERY     Cyst removed   ENDOSCOPIC MUCOSAL RESECTION N/A 01/08/2023   Procedure: ENDOSCOPIC MUCOSAL RESECTION;  Surgeon: Meridee Score Netty Starring., MD;  Location: [facility];  Service: Gastroenterology;  Laterality: N/A;   EUS N/A 01/08/2023   Procedure: LOWER ENDOSCOPIC ULTRASOUND (EUS);  Surgeon: Lemar Lofty., MD;  Location: [facility];  Service: Gastroenterology;  Laterality: N/A;   FLEXIBLE SIGMOIDOSCOPY N/A 01/08/2023   Procedure: FLEXIBLE SIGMOIDOSCOPY;  Surgeon: Meridee Score Netty Starring., MD;  Location: [facility];  Service: Gastroenterology;  Laterality: N/A;   HEMOSTASIS CLIP PLACEMENT  01/08/2023   Procedure: HEMOSTASIS CLIP PLACEMENT;  Surgeon: Lemar Lofty., MD;  Location: [facility];  Service: Gastroenterology;;   HYSTEROSCOPY  2002   myomectomy   MYOMECTOMY  10/2005   PELVIC LAPAROSCOPY     Expl. Lap.   REFRACTIVE SURGERY  07/26/2022   SUBMUCOSAL LIFTING INJECTION  01/08/2023   Procedure: SUBMUCOSAL LIFTING INJECTION;  Surgeon: Meridee Score Netty Starring., MD;  Location: [facility];  Service: Gastroenterology;;   Allergies  Allergen Reactions   Latex Itching and Rash   Metformin And Related Other (See Comments)    Diarrhea    Prior to Admission medications   Medication Sig Start Date End Date Taking? Authorizing Provider  ACCU-CHEK GUIDE test strip TEST ONCE DAILY 02/04/23  Yes Shade Flood, MD  acetaminophen (TYLENOL) 325 MG tablet Take 2 tablets (650 mg total) by mouth every 6 (six) hours as needed for up to 30 doses for moderate pain or mild pain. 10/12/22  Yes Trifan, Kermit Balo, MD  albuterol (VENTOLIN HFA) 108 (90 Base) MCG/ACT inhaler Inhale 2 puffs into the lungs every 6 (six) hours as needed (bronchitis/Asthma). 09/17/21  Yes [provider]  Biotin w/ Vitamins C & E (HAIR SKIN & NAILS GUMMIES PO) Take 2 Pieces of gum by mouth daily.   Yes [provider]  Blood Glucose Monitoring Suppl DEVI 1 each by Does not apply route daily. May substitute to any manufacturer covered by patient's insurance. 11/08/22  Yes  Shade Flood, MD  cyclobenzaprine (FLEXERIL) 10 MG tablet Take 1 tablet (10 mg total) by mouth 2 (two) times daily as needed for up to 14 doses for muscle spasms. 10/12/22  Yes Terald Sleeper, MD  empagliflozin (JARDIANCE) 10 MG TABS tablet Take 1 tablet (10 mg total) by mouth daily before breakfast. 11/23/22  Yes Shade Flood, MD  ibuprofen (ADVIL) 600 MG tablet Take 1 tablet (600 mg total) by mouth every 6 (six) hours as needed for up to 30 doses for headache or mild pain. 01/15/23  Yes Mansouraty, Netty Starring., MD  metFORMIN (GLUCOPHAGE) 500 MG tablet Take 500 mg by mouth daily with breakfast.   Yes [provider]  prednisoLONE acetate (PRED FORTE) 1 % ophthalmic suspension Place 1 drop into the left eye daily. 11/09/22  Yes [provider]  sertraline (ZOLOFT) 25 MG tablet Take 1 tablet (25 mg total) by mouth daily. 11/08/22  Yes Shade Flood, MD   Social History   Socioeconomic History   Marital status: Single    Spouse name: Not on file   Number of children: Not on file   Years of education: Not on file   Highest education level: Not on file  Occupational History   Not on file  Tobacco Use   Smoking status: Every Day    Current packs/day: 0.50    Types: Cigarettes   Smokeless tobacco: Never  Vaping Use   Vaping status: Never Used  Substance and Sexual Activity   Alcohol use: Not Currently    Alcohol/week: 5.0 standard drinks of alcohol    Types: 5 Cans of beer per week   Drug use: No   Sexual activity: Not Currently    Comment: 1st intercourse 57 yo-Fewer than 5 partners  Other Topics Concern   Not on file  Social History Narrative   Not on file   Social Determinants of Health   Financial Resource Strain: Not on file  Food Insecurity: No Food Insecurity (09/18/2022)    Hunger Vital Sign    Worried About Running Out of Food in the Last Year: Never true    Ran Out of Food in the Last Year: Never true  Transportation Needs: No Transportation Needs (09/18/2022)   PRAPARE - Administrator, Civil Service (Medical): No    Lack of Transportation (Non-Medical): No  Physical Activity: Not on file  Stress: Not on file  Social Connections: Not on file  Intimate Partner Violence: Not on file    Review of Systems   Objective:   Vitals:   02/28/23 1335  BP: 120/78  Pulse: 87  Temp: 98.8 F (37.1 C)  TempSrc: Temporal  SpO2: 98%  Weight: 163 lb 3.2 oz (74 kg)  Height: 5\' 7"  (1.702 m)     Physical Exam Vitals reviewed.  Constitutional:      Appearance: Normal appearance. She is well-developed.  HENT:     Head: Normocephalic and atraumatic.  Eyes:     Conjunctiva/sclera: Conjunctivae normal.     Pupils: Pupils are equal, round, and reactive to light.  Neck:     Vascular: No carotid bruit.  Cardiovascular:     Rate and Rhythm: Normal rate and regular rhythm.     Heart sounds: Normal heart sounds.  Pulmonary:     Effort: Pulmonary effort is normal.     Breath sounds: Normal breath sounds.  Abdominal:     Palpations: Abdomen is soft. There is no pulsatile mass.  Tenderness: There is no abdominal tenderness.  Musculoskeletal:     Right lower leg: No edema.     Left lower leg: No edema.  Skin:    General: Skin is warm and dry.  Neurological:     Mental Status: She is alert and oriented to person, place, and time.  Psychiatric:        Mood and Affect: Mood normal.        Behavior: Behavior normal.        Assessment & Plan:  Brandi Cooke is a 57 y.o. female . Hyperlipidemia associated with type 2 diabetes mellitus (HCC) - Plan: Comprehensive metabolic panel, Hemoglobin A1c, Microalbumin / creatinine urine ratio, Lipid panel, atorvastatin (LIPITOR) 10 MG tablet  -Undergoing weight loss, but likely will still need  statin given diabetic history and elevated readings previously.  Check updated labs, trial of Lipitor 10 mg initially once per week then increase dosing frequency as tolerated up to daily.  Potential side effects and risk discussed.  Anxiety with depression  -Overall stable, typical dosing of SSRI with daily dosing discussed along with food.  Continue 25 mg for now.  If well-controlled on once weekly dosing she may be able to stop medication and monitor symptoms off SSRI but she preferred to continue on medication at this time.  Abdominal bloating Type 2 diabetes mellitus with hyperglycemia, without long-term current use of insulin (HCC)  -Suspect abdominal bloating may be in part due to metformin.  Advised to stop that altogether at this point.  Handout given on abdominal bloating as well as potential trigger foods, close follow-up, especially if not improving off metformin to look into other causes.  Check A1c and depending on that level can decide on other options, potentially GLP, GIP, or DPP 4, as SGLT2 was cost prohibitive.   Meds ordered this encounter  Medications   atorvastatin (LIPITOR) 10 MG tablet    Sig: Take 1 tablet (10 mg total) by mouth daily. Okay to start once per week then slowly increase dosing as tolerated.    Dispense:  90 tablet    Refill:  0   Patient Instructions  Daily sertraline with food is typically recommended. Glad you are doing ok , but as we discussed you can take that medication every day.  Cholesterol is elevated and with diabetes you should be on a statin medication.  I will write for Lipitor, start with 1 day/week and if you tolerate that dose can increase by an additional day per week all the way up to every day if it is tolerated.  We can see how things are going in 3 weeks when we discuss labs further at that time.  Keep up the good work with diet and exercise, and congratulations on the weight loss since last visit!  Stop metformin.  See information  below on foods that can cause increased gas or bloating.  Will discuss this further in 3 weeks and if you continue to have bloating symptoms off of the metformin we can look at other causes.  Depending on blood sugar test today we will discuss other meds, but if I do prescribe a medicine that is too costly, please let me know so I can make a change.  Abdominal Bloating When you have abdominal bloating, your abdomen may feel full, tight, or painful. It may also look bigger than normal or swollen (distended). Common causes of abdominal bloating include: Swallowing air. Constipation. Problems digesting food. Eating too much. Irritable bowel syndrome.  This is a condition that affects the large intestine. Lactose intolerance. This is an inability to digest lactose, a natural sugar in dairy products. Celiac disease. This is a condition that affects the ability to digest gluten, a protein found in some grains. Gastroparesis. This is a condition that slows down the movement of food in the stomach and small intestine. It is more common in people with diabetes mellitus. Gastroesophageal reflux disease (GERD). This is a condition that makes stomach acid flow back into the esophagus. Urinary retention. This means that the body is holding onto urine, and the bladder cannot be emptied all the way. Follow these instructions at home: Eating and drinking Avoid eating too much. Try not to swallow air while talking or eating. Avoid eating while lying down. Avoid these foods and drinks: Foods that cause gas, such as broccoli, cabbage, cauliflower, and baked beans. Carbonated drinks. Hard candy. Chewing gum. Medicines Take over-the-counter and prescription medicines only as told by your health care provider. Take probiotic medicines. These medicines contain live bacteria or yeasts that can help digestion. Take coated peppermint oil capsules. General instructions Try to exercise regularly. Exercise may help  to relieve bloating that is caused by gas and relieve constipation. Keep all follow-up visits. This is important. Contact a health care provider if: You have nausea and vomiting. You have diarrhea. You have abdominal pain. You have unusual weight loss or weight gain. You have severe pain, and medicines do not help. Get help right away if: You have chest pain. You have trouble breathing. You have shortness of breath. You have trouble urinating. You have darker urine than normal. You have blood in your stools or have dark, tarry stools. These symptoms may represent a serious problem that is an emergency. Do not wait to see if the symptoms will go away. Get medical help right away. Call your local emergency services (911 in the [place] Do not drive yourself to the hospital. Summary Abdominal bloating means that the abdomen is swollen. Common causes of abdominal bloating are swallowing air, constipation, and problems digesting food. Avoid eating too much and avoid swallowing air. Avoid foods that cause gas, carbonated drinks, hard candy, and chewing gum. This information is not intended to replace advice given to you by your health care provider. Make sure you discuss any questions you have with your health care provider. Document Revised: 02/16/2020 Document Reviewed: 02/16/2020 Elsevier Patient Education  2024 Elsevier Inc.      Signed,   Meredith Staggers, MD [facility], [facility] 02/28/23 2:00 PM

## 2023-03-13 ENCOUNTER — Other Ambulatory Visit: Payer: Self-pay | Admitting: Family Medicine

## 2023-03-18 ENCOUNTER — Other Ambulatory Visit: Payer: Self-pay | Admitting: Family Medicine

## 2023-03-18 DIAGNOSIS — F418 Other specified anxiety disorders: Secondary | ICD-10-CM

## 2023-03-22 ENCOUNTER — Ambulatory Visit (INDEPENDENT_AMBULATORY_CARE_PROVIDER_SITE_OTHER): Payer: 59 | Admitting: Family Medicine

## 2023-03-22 ENCOUNTER — Other Ambulatory Visit: Payer: Self-pay | Admitting: Family Medicine

## 2023-03-22 VITALS — BP 122/74 | HR 81 | Temp 98.0°F | Resp 16 | Ht 67.0 in | Wt 163.0 lb

## 2023-03-22 DIAGNOSIS — E1165 Type 2 diabetes mellitus with hyperglycemia: Secondary | ICD-10-CM | POA: Diagnosis not present

## 2023-03-22 DIAGNOSIS — R140 Abdominal distension (gaseous): Secondary | ICD-10-CM | POA: Diagnosis not present

## 2023-03-22 LAB — COMPREHENSIVE METABOLIC PANEL
ALT: 19 U/L (ref 0–35)
AST: 18 U/L (ref 0–37)
Albumin: 4.2 g/dL (ref 3.5–5.2)
Alkaline Phosphatase: 78 U/L (ref 39–117)
BUN: 8 mg/dL (ref 6–23)
CO2: 26 mEq/L (ref 19–32)
Calcium: 9.2 mg/dL (ref 8.4–10.5)
Chloride: 108 mEq/L (ref 96–112)
Creatinine, Ser: 0.85 mg/dL (ref 0.40–1.20)
GFR: 77.39 mL/min (ref >60.00)
Glucose, Bld: 105 mg/dL — ABNORMAL HIGH (ref 70–99)
Potassium: 4.4 mEq/L (ref 3.5–5.1)
Sodium: 142 mEq/L (ref 135–145)
Total Bilirubin: 0.3 mg/dL (ref 0.2–1.2)
Total Protein: 6.9 g/dL (ref 6.0–8.3)

## 2023-03-22 LAB — LDL CHOLESTEROL, DIRECT: Direct LDL: 172 mg/dL

## 2023-03-22 LAB — LIPID PANEL
Cholesterol: 231 mg/dL — ABNORMAL HIGH (ref 0–200)
HDL: 41.6 mg/dL (ref >39.00)
NonHDL: 189.39
Total CHOL/HDL Ratio: 6
Triglycerides: 213 mg/dL — ABNORMAL HIGH (ref 0.0–149.0)
VLDL: 42.6 mg/dL — ABNORMAL HIGH (ref 0.0–40.0)

## 2023-03-22 LAB — HEMOGLOBIN A1C: Hgb A1c MFr Bld: 7.2 % — ABNORMAL HIGH (ref 4.6–6.5)

## 2023-03-22 LAB — MICROALBUMIN / CREATININE URINE RATIO
Creatinine,U: 326.5 mg/dL
Microalb Creat Ratio: 0.3 mg/g (ref 0.0–30.0)
Microalb, Ur: 0.9 mg/dL (ref 0.0–1.9)

## 2023-03-22 NOTE — Telephone Encounter (Signed)
Spoke w/ Josh at [facility] the pharmacist . He states the pt 's insurance not with [employer] any longer . She will need to call the back of her card to verify what pharmacy she can use . I attempted to call pt and no answer and unable to leave a VM

## 2023-03-22 NOTE — Progress Notes (Unsigned)
Subjective:  Patient ID: Brandi Cooke, female    DOB: 08/09/1965  Age: 56 y.o. MRN: [id number]  CC:  Chief Complaint  Patient presents with   Follow-up    Follow up 3 Months    HPI Brandi Cooke presents for   Follow-up from August 1. Did not have labs last visit.   Abdominal bloating: Discussed at last visit, thought to be due to metformin.  Advised to stop metformin at that time as well as potential trigger foods discussed for abdominal bloating. Has not taken metformin since 8/1.  Bloating has improved. Has adjusted diet.   Diabetes: As above, stopped metformin last visit.  Unable to get Jardiance - not covered by insurance.  Losing weight, we have discussed statin with Lipitor, started at last visit. Told lipitor was also not covered by insurance. Did not fill. [facility] on [address].  No current meds for diabetes  Home readings- not checking.  Microalbumin: due.   Lab Results  Component Value Date   HGBA1C 7.5 (H) 10/04/2022   HGBA1C 6.8 (H) 11/16/2021   Lab Results  Component Value Date   LDLCALC 145 (H) 10/04/2022   CREATININE 1.00 12/21/2022        History Patient Active Problem List   Diagnosis Date Noted   Granular cell tumor 12/07/2013   Fibroid    Bell's palsy    Glaucoma    Past Medical History:  Diagnosis Date   Allergy    Anemia    Bell's palsy    Fibroid    Glaucoma    Open   Hypertension    Vertigo    Past Surgical History:  Procedure Laterality Date   BREAST EXCISIONAL BIOPSY Left    BREAST LUMPECTOMY WITH RADIOACTIVE SEED LOCALIZATION Left 01/12/2014   Procedure: LEFT BREAST LUMPECTOMY WITH RADIOACTIVE SEED LOCALIZATION;  Surgeon: Maisie Fus A. Cornett, MD;  Location: [facility];  Service: General;  Laterality: Left;   BREAST SURGERY     Cyst removed   ENDOSCOPIC MUCOSAL RESECTION N/A 01/08/2023   Procedure: ENDOSCOPIC MUCOSAL RESECTION;  Surgeon: Meridee Score Netty Starring., MD;  Location: [facility];   Service: Gastroenterology;  Laterality: N/A;   EUS N/A 01/08/2023   Procedure: LOWER ENDOSCOPIC ULTRASOUND (EUS);  Surgeon: Lemar Lofty., MD;  Location: [facility];  Service: Gastroenterology;  Laterality: N/A;   FLEXIBLE SIGMOIDOSCOPY N/A 01/08/2023   Procedure: FLEXIBLE SIGMOIDOSCOPY;  Surgeon: Meridee Score Netty Starring., MD;  Location: [facility];  Service: Gastroenterology;  Laterality: N/A;   HEMOSTASIS CLIP PLACEMENT  01/08/2023   Procedure: HEMOSTASIS CLIP PLACEMENT;  Surgeon: Lemar Lofty., MD;  Location: [facility];  Service: Gastroenterology;;   HYSTEROSCOPY  2002   myomectomy   MYOMECTOMY  10/2005   PELVIC LAPAROSCOPY     Expl. Lap.   REFRACTIVE SURGERY  07/26/2022   SUBMUCOSAL LIFTING INJECTION  01/08/2023   Procedure: SUBMUCOSAL LIFTING INJECTION;  Surgeon: Meridee Score Netty Starring., MD;  Location: [facility];  Service: Gastroenterology;;   Allergies  Allergen Reactions   Latex Itching and Rash   Metformin And Related Other (See Comments)    Diarrhea    Prior to Admission medications   Medication Sig Start Date End Date Taking? Authorizing Provider  ACCU-CHEK GUIDE test strip TEST ONCE DAILY 02/04/23  Yes Shade Flood, MD  Accu-Chek Softclix Lancets lancets TEST ONCE DAILY 03/14/23  Yes Shade Flood, MD  acetaminophen (TYLENOL) 325 MG tablet Take 2 tablets (650 mg total) by mouth every 6 (six)  hours as needed for up to 30 doses for moderate pain or mild pain. 10/12/22  Yes Trifan, Kermit Balo, MD  albuterol (VENTOLIN HFA) 108 (90 Base) MCG/ACT inhaler Inhale 2 puffs into the lungs every 6 (six) hours as needed (bronchitis/Asthma). 09/17/21  Yes [provider]  atorvastatin (LIPITOR) 10 MG tablet Take 1 tablet (10 mg total) by mouth daily. Okay to start once per week then slowly increase dosing as tolerated. 02/28/23  Yes Shade Flood, MD  Biotin w/ Vitamins C & E (HAIR SKIN & NAILS GUMMIES PO) Take 2 Pieces of gum by mouth daily.   Yes  [provider]  Blood Glucose Monitoring Suppl DEVI 1 each by Does not apply route daily. May substitute to any manufacturer covered by patient's insurance. 11/08/22  Yes Shade Flood, MD  cyclobenzaprine (FLEXERIL) 10 MG tablet Take 1 tablet (10 mg total) by mouth 2 (two) times daily as needed for up to 14 doses for muscle spasms. 10/12/22  Yes Terald Sleeper, MD  empagliflozin (JARDIANCE) 10 MG TABS tablet Take 1 tablet (10 mg total) by mouth daily before breakfast. 11/23/22  Yes Shade Flood, MD  ibuprofen (ADVIL) 600 MG tablet Take 1 tablet (600 mg total) by mouth every 6 (six) hours as needed for up to 30 doses for headache or mild pain. 01/15/23  Yes Mansouraty, Netty Starring., MD  prednisoLONE acetate (PRED FORTE) 1 % ophthalmic suspension Place 1 drop into the left eye daily. 11/09/22  Yes [provider]  sertraline (ZOLOFT) 25 MG tablet TAKE 1 TABLET(25 MG) BY MOUTH DAILY 03/19/23  Yes Shade Flood, MD   Social History   Socioeconomic History   Marital status: Single    Spouse name: Not on file   Number of children: Not on file   Years of education: Not on file   Highest education level: Not on file  Occupational History   Not on file  Tobacco Use   Smoking status: Every Day    Current packs/day: 0.50    Types: Cigarettes   Smokeless tobacco: Never  Vaping Use   Vaping status: Never Used  Substance and Sexual Activity   Alcohol use: Not Currently    Alcohol/week: 5.0 standard drinks of alcohol    Types: 5 Cans of beer per week   Drug use: No   Sexual activity: Not Currently    Comment: 1st intercourse 57 yo-Fewer than 5 partners  Other Topics Concern   Not on file  Social History Narrative   Not on file   Social Determinants of Health   Financial Resource Strain: Not on file  Food Insecurity: No Food Insecurity (09/18/2022)   Hunger Vital Sign    Worried About Running Out of Food in the Last Year: Never true    Ran Out of Food in the  Last Year: Never true  Transportation Needs: No Transportation Needs (09/18/2022)   PRAPARE - Administrator, Civil Service (Medical): No    Lack of Transportation (Non-Medical): No  Physical Activity: Not on file  Stress: Not on file  Social Connections: Not on file  Intimate Partner Violence: Not on file    Review of Systems  Constitutional:  Negative for fatigue and unexpected weight change.  Respiratory:  Negative for chest tightness and shortness of breath.   Cardiovascular:  Negative for chest pain, palpitations and leg swelling.  Gastrointestinal:  Negative for abdominal pain and blood in stool.  Neurological:  Negative for  dizziness, syncope, light-headedness and headaches.     Objective:   Vitals:   03/22/23 1115  BP: 122/74  Pulse: 81  Resp: 16  Temp: 98 F (36.7 C)  TempSrc: Oral  SpO2: 96%  Weight: 163 lb (73.9 kg)  Height: 5\' 7"  (1.702 m)     Physical Exam Vitals reviewed.  Constitutional:      Appearance: Normal appearance. She is well-developed.  HENT:     Head: Normocephalic and atraumatic.  Eyes:     Conjunctiva/sclera: Conjunctivae normal.     Pupils: Pupils are equal, round, and reactive to light.  Neck:     Vascular: No carotid bruit.  Cardiovascular:     Rate and Rhythm: Normal rate and regular rhythm.     Heart sounds: Normal heart sounds.  Pulmonary:     Effort: Pulmonary effort is normal.     Breath sounds: Normal breath sounds.  Abdominal:     Palpations: Abdomen is soft. There is no pulsatile mass.     Tenderness: There is no abdominal tenderness.  Musculoskeletal:     Right lower leg: No edema.     Left lower leg: No edema.  Skin:    General: Skin is warm and dry.  Neurological:     Mental Status: She is alert and oriented to person, place, and time.  Psychiatric:        Mood and Affect: Mood normal.        Behavior: Behavior normal.        Assessment & Plan:  TROI RIRIE is a 57 y.o. female . No  diagnosis found.   No orders of the defined types were placed in this encounter.  There are no Patient Instructions on file for this visit.    Signed,   Meredith Staggers, MD [facility], [facility] 03/22/23 12:07 PM

## 2023-03-22 NOTE — Telephone Encounter (Signed)
Patient in office today and reports that Lipitor was not able to be filled at pharmacy for [facility], please call and check into issue.  Was this a coverage issue or availability issue?

## 2023-03-22 NOTE — Progress Notes (Signed)
Labs changed to future orders for lab to release

## 2023-03-22 NOTE — Patient Instructions (Signed)
Glad to hear that the bloating has improved.  I will check some labs today and then we can decide on changing medications.  I will also have our staff call your pharmacy to figure out the issues with the cholesterol pill and other medication.  Thank you for coming in today.

## 2023-03-23 ENCOUNTER — Encounter: Payer: Self-pay | Admitting: Family Medicine

## 2023-03-26 ENCOUNTER — Telehealth: Payer: Self-pay

## 2023-03-26 NOTE — Telephone Encounter (Signed)
Attempted to reach patient. Unable to leave VM.

## 2023-03-26 NOTE — Telephone Encounter (Signed)
-----   Message from Shade Flood sent at 03/26/2023  1:24 PM EDT ----- Results sent by MyChart, but no recent login.  Please call her with these results and plan.

## 2023-04-24 ENCOUNTER — Telehealth: Payer: Self-pay | Admitting: Family Medicine

## 2023-04-24 NOTE — Telephone Encounter (Signed)
Disability Determination Services requesting forms be completed and returned.   Charge sheet attached and placed in front bin.

## 2023-04-30 ENCOUNTER — Encounter: Payer: Self-pay | Admitting: Family Medicine

## 2023-04-30 NOTE — Telephone Encounter (Signed)
Letter printed for sign, then fax tomorrow.

## 2023-05-01 ENCOUNTER — Encounter: Payer: Self-pay | Admitting: Family Medicine

## 2023-05-01 NOTE — Telephone Encounter (Signed)
Printed, signed, ready to fax, placed in fax bin at back nurse station

## 2023-06-20 ENCOUNTER — Encounter: Payer: Self-pay | Admitting: Family Medicine

## 2023-06-20 ENCOUNTER — Ambulatory Visit (INDEPENDENT_AMBULATORY_CARE_PROVIDER_SITE_OTHER): Payer: 59 | Admitting: Family Medicine

## 2023-06-20 VITALS — BP 124/78 | HR 86 | Temp 97.9°F | Ht 67.0 in | Wt 160.2 lb

## 2023-06-20 DIAGNOSIS — E785 Hyperlipidemia, unspecified: Secondary | ICD-10-CM

## 2023-06-20 DIAGNOSIS — E1169 Type 2 diabetes mellitus with other specified complication: Secondary | ICD-10-CM

## 2023-06-20 DIAGNOSIS — E1165 Type 2 diabetes mellitus with hyperglycemia: Secondary | ICD-10-CM

## 2023-06-20 NOTE — Progress Notes (Signed)
Subjective:  Patient ID: Brandi Cooke, female    DOB: 02-19-1966  Age: 57 y.o. MRN: [id number]  CC:  Chief Complaint  Patient presents with   Medical Management of Chronic Issues    Pt is doing well no concerns at this time     HPI Brandi Cooke presents for   Diabetes: Complicated by hyperglycemia.  Initially treated with metformin, then due to GI side effects changed to Jardiance but [name] was cost prohibitive.Difficulty obtaining Jardiance at last visit.  Had made some dietary changes and weight loss. Previous abdominal bloating improved off metformin as well as with dietary changes Has lost 3 pounds since last visit. Cut back on portions.  Soda - minimal. More water.  Fast food - cut back.  Home readings - unknown - does not remember readings - normal. No 200's or lows.   Statin recommended previously with diabetes, ASCVD risk score 13%.  Lipitor have been ordered, reported possible issue with pharmacy coverage at last visit. Told not accepted with her medicaid? No diabetes meds, no abdominal bloating or pain.  Microalbumin: Normal ratio 03/22/2023 Shingrix - declined.  Flu vaccine - declines.   Wt Readings from Last 3 Encounters:  06/20/23 160 lb 3.2 oz (72.7 kg)  03/22/23 163 lb (73.9 kg)  02/28/23 163 lb 3.2 oz (74 kg)  Body mass index is 25.09 kg/m.   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 Cornett, MD;  Location: [facility];  Service: General;  Laterality: Left;   BREAST SURGERY     Cyst removed   ENDOSCOPIC MUCOSAL RESECTION N/A 01/08/2023   Procedure: ENDOSCOPIC MUCOSAL RESECTION;  Surgeon: Meridee Score Netty Starring., MD;  Location: [facility];  Service: Gastroenterology;  Laterality: N/A;   EUS N/A 01/08/2023   Procedure: LOWER ENDOSCOPIC ULTRASOUND (EUS);  Surgeon: Lemar Lofty., MD;  Location: [facility];  Service: Gastroenterology;  Laterality: N/A;   FLEXIBLE SIGMOIDOSCOPY N/A 01/08/2023   Procedure: FLEXIBLE SIGMOIDOSCOPY;  Surgeon: Meridee Score Netty Starring., MD;  Location: [facility];  Service: Gastroenterology;  Laterality: N/A;   HEMOSTASIS CLIP PLACEMENT  01/08/2023   Procedure: HEMOSTASIS CLIP PLACEMENT;  Surgeon: Lemar Lofty., MD;  Location: [facility];  Service: Gastroenterology;;   HYSTEROSCOPY  2002   myomectomy   MYOMECTOMY  10/2005   PELVIC LAPAROSCOPY     Expl. Lap.   REFRACTIVE SURGERY  07/26/2022   SUBMUCOSAL LIFTING INJECTION  01/08/2023   Procedure: SUBMUCOSAL LIFTING INJECTION;  Surgeon: Meridee Score Netty Starring., MD;  Location: [facility];  Service: Gastroenterology;;   Allergies  Allergen Reactions   Latex Itching and Rash   Metformin And Related Other (See Comments)    Diarrhea    Prior to Admission medications   Medication Sig Start Date  End Date Taking? Authorizing Provider  ACCU-CHEK GUIDE test strip TEST ONCE DAILY 02/04/23  Yes Shade Flood, MD  Accu-Chek Softclix Lancets lancets TEST ONCE DAILY 03/14/23  Yes Shade Flood, MD  acetaminophen (TYLENOL) 325 MG tablet Take 2 tablets (650 mg total) by mouth every 6 (six) hours as needed for up to 30 doses for moderate pain or mild pain. 10/12/22  Yes Trifan, Kermit Balo, MD  albuterol (VENTOLIN HFA) 108 (90 Base) MCG/ACT inhaler Inhale 2 puffs into the lungs every 6 (six) hours as needed  (bronchitis/Asthma). 09/17/21  Yes [provider]  atorvastatin (LIPITOR) 10 MG tablet Take 1 tablet (10 mg total) by mouth daily. Okay to start once per week then slowly increase dosing as tolerated. 02/28/23  Yes Shade Flood, MD  Biotin w/ Vitamins C & E (HAIR SKIN & NAILS GUMMIES PO) Take 2 Pieces of gum by mouth daily.   Yes [provider]  Blood Glucose Monitoring Suppl DEVI 1 each by Does not apply route daily. May substitute to any manufacturer covered by patient's insurance. 11/08/22  Yes Shade Flood, MD  cyclobenzaprine (FLEXERIL) 10 MG tablet Take 1 tablet (10 mg total) by mouth 2 (two) times daily as needed for up to 14 doses for muscle spasms. 10/12/22  Yes Terald Sleeper, MD  empagliflozin (JARDIANCE) 10 MG TABS tablet Take 1 tablet (10 mg total) by mouth daily before breakfast. 11/23/22  Yes Shade Flood, MD  ibuprofen (ADVIL) 600 MG tablet Take 1 tablet (600 mg total) by mouth every 6 (six) hours as needed for up to 30 doses for headache or mild pain. 01/15/23  Yes Mansouraty, Netty Starring., MD  prednisoLONE acetate (PRED FORTE) 1 % ophthalmic suspension Place 1 drop into the left eye daily. 11/09/22  Yes [provider]  sertraline (ZOLOFT) 25 MG tablet TAKE 1 TABLET(25 MG) BY MOUTH DAILY 03/19/23  Yes Shade Flood, MD   Social History   Socioeconomic History   Marital status: Single    Spouse name: Not on file   Number of children: Not on file   Years of education: Not on file   Highest education level: Not on file  Occupational History   Not on file  Tobacco Use   Smoking status: Every Day    Current packs/day: 0.50    Types: Cigarettes   Smokeless tobacco: Never  Vaping Use   Vaping status: Never Used  Substance and Sexual Activity   Alcohol use: Not Currently    Alcohol/week: 5.0 standard drinks of alcohol    Types: 5 Cans of beer per week   Drug use: No   Sexual activity: Not Currently    Comment: 1st intercourse 57  yo-Fewer than 5 partners  Other Topics Concern   Not on file  Social History Narrative   Not on file   Social Determinants of Health   Financial Resource Strain: Not on file  Food Insecurity: No Food Insecurity (09/18/2022)   Hunger Vital Sign    Worried About Running Out of Food in the Last Year: Never true    Ran Out of Food in the Last Year: Never true  Transportation Needs: No Transportation Needs (09/18/2022)   PRAPARE - Administrator, Civil Service (Medical): No    Lack of Transportation (Non-Medical): No  Physical Activity: Not on file  Stress: Not on file  Social Connections: Not on file  Intimate Partner Violence: Not on file  Review of Systems  Constitutional:  Negative for fatigue and unexpected weight change.  Respiratory:  Negative for chest tightness and shortness of breath.   Cardiovascular:  Negative for chest pain, palpitations and leg swelling.  Gastrointestinal:  Negative for abdominal pain and blood in stool.  Neurological:  Negative for dizziness, syncope, light-headedness and headaches.     Objective:   Vitals:   06/20/23 1334  BP: 124/78  Pulse: 86  Temp: 97.9 F (36.6 C)  TempSrc: Temporal  SpO2: 98%  Weight: 160 lb 3.2 oz (72.7 kg)  Height: 5\' 7"  (1.702 m)     Physical Exam Vitals reviewed.  Constitutional:      Appearance: Normal appearance. She is well-developed.  HENT:     Head: Normocephalic and atraumatic.  Eyes:     Conjunctiva/sclera: Conjunctivae normal.     Pupils: Pupils are equal, round, and reactive to light.  Neck:     Vascular: No carotid bruit.  Cardiovascular:     Rate and Rhythm: Normal rate and regular rhythm.     Heart sounds: Normal heart sounds.  Pulmonary:     Effort: Pulmonary effort is normal.     Breath sounds: Normal breath sounds.  Abdominal:     Palpations: Abdomen is soft. There is no pulsatile mass.     Tenderness: There is no abdominal tenderness.  Musculoskeletal:     Right lower  leg: No edema.     Left lower leg: No edema.  Skin:    General: Skin is warm and dry.  Neurological:     Mental Status: She is alert and oriented to person, place, and time.  Psychiatric:        Mood and Affect: Mood normal.        Behavior: Behavior normal.        Assessment & Plan:  Brandi Cooke is a 57 y.o. female . Type 2 diabetes mellitus with hyperglycemia, without long-term current use of insulin (HCC) - Plan: Comprehensive metabolic panel, Hemoglobin A1c  -Check A1c, labs and adjust plan accordingly.  No current meds at this time.  Based on last A1c may need to add different medication as intolerant to metformin.  Previous bloating and abdominal pain resolved off that medication without recurrence.  Has lost some weight as above, commended on dietary efforts, regular healthy meals discussed.  Hyperlipidemia associated with type 2 diabetes mellitus (HCC) - Plan: Comprehensive metabolic panel  -As above question regarding coverage for atorvastatin.  Will need to check with pharmacy for more information and consider other statin depending on coverage.  Lab visit at [facility] next week as not quite 90 days since last A1c.  No orders of the defined types were placed in this encounter.  Patient Instructions  Please have labs at [facility] location next week.  If blood sugar levels are still elevated we will need to look into other medications. We will check into the issue with the Lipitor.   Follow up in 3 months. Keep up the good work with diet!   Take care.   [facility] or xray: Walk in 8:30-4:30 during weekdays, no appointment needed [address]  [place], [place] [address]      Signed,   Meredith Staggers, MD [facility], [facility] 06/20/23 5:30 PM

## 2023-06-20 NOTE — Patient Instructions (Addendum)
Please have labs at [facility] location next week.  If blood sugar levels are still elevated we will need to look into other medications. We will check into the issue with the Lipitor.   Follow up in 3 months. Keep up the good work with diet!   Take care.   [facility] or xray: Walk in 8:30-4:30 during weekdays, no appointment needed [address]  [place], [place] [address]

## 2023-06-25 ENCOUNTER — Other Ambulatory Visit (INDEPENDENT_AMBULATORY_CARE_PROVIDER_SITE_OTHER): Payer: 59

## 2023-06-25 DIAGNOSIS — E785 Hyperlipidemia, unspecified: Secondary | ICD-10-CM | POA: Diagnosis not present

## 2023-06-25 DIAGNOSIS — E1169 Type 2 diabetes mellitus with other specified complication: Secondary | ICD-10-CM | POA: Diagnosis not present

## 2023-06-25 DIAGNOSIS — E1165 Type 2 diabetes mellitus with hyperglycemia: Secondary | ICD-10-CM | POA: Diagnosis not present

## 2023-06-25 LAB — COMPREHENSIVE METABOLIC PANEL
ALT: 20 U/L (ref 0–35)
AST: 18 U/L (ref 0–37)
Albumin: 4.4 g/dL (ref 3.5–5.2)
Alkaline Phosphatase: 75 U/L (ref 39–117)
BUN: 10 mg/dL (ref 6–23)
CO2: 26 meq/L (ref 19–32)
Calcium: 9.6 mg/dL (ref 8.4–10.5)
Chloride: 106 meq/L (ref 96–112)
Creatinine, Ser: 0.87 mg/dL (ref 0.40–1.20)
GFR: 75.12 mL/min (ref >60.00)
Glucose, Bld: 94 mg/dL (ref 70–99)
Potassium: 4 meq/L (ref 3.5–5.1)
Sodium: 141 meq/L (ref 135–145)
Total Bilirubin: 0.3 mg/dL (ref 0.2–1.2)
Total Protein: 7.3 g/dL (ref 6.0–8.3)

## 2023-06-25 LAB — HEMOGLOBIN A1C: Hgb A1c MFr Bld: 7.3 % — ABNORMAL HIGH (ref 4.6–6.5)

## 2023-07-03 ENCOUNTER — Telehealth: Payer: Self-pay

## 2023-07-03 NOTE — Telephone Encounter (Signed)
Unable to leave vm.

## 2023-07-03 NOTE — Telephone Encounter (Signed)
Electrolytes, kidney, liver tests looked okay.  83-month blood sugar test 7.3, close to goal of 7.0 or lower.  We could consider a medication called Januvia once per day, or [place] which is similar to [place] that was prescribed previously.  Please check with your pharmacist to see if that would be too costly since Jardiance was too high.  If okay, I will send 1 of these to your pharmacy, and would recheck levels in 3 months.  Let me know if there are questions.   Dr. Neva Seat

## 2023-07-04 NOTE — Telephone Encounter (Signed)
Letter out

## 2023-07-04 NOTE — Telephone Encounter (Signed)
Called again with no answer and still unable to LM

## 2023-07-05 DIAGNOSIS — H2512 Age-related nuclear cataract, left eye: Secondary | ICD-10-CM | POA: Diagnosis not present

## 2023-07-05 DIAGNOSIS — H401133 Primary open-angle glaucoma, bilateral, severe stage: Secondary | ICD-10-CM | POA: Diagnosis not present

## 2023-07-12 ENCOUNTER — Telehealth: Payer: Self-pay | Admitting: Family Medicine

## 2023-07-12 NOTE — Telephone Encounter (Signed)
Per last lab results   Electrolytes, kidney, liver tests looked okay.  63-month blood sugar test 7.3, close to goal of 7.0 or lower.  We could consider a medication called Januvia once per day, or [place] which is similar to [place] that was prescribed previously.  Please check with your pharmacist to see if that would be too costly since Jardiance was too high.  If okay, I will send 1 of these to your pharmacy.

## 2023-07-12 NOTE — Telephone Encounter (Signed)
Caller name: AYN CARRITHERS  On DPR?: Yes  Call back number: [phone] (mobile)  Provider they see: Shade Flood, MD  Reason for call:  Pt called and said she received a letter in the mail regarding lab results. She would like a return call to discuss results

## 2023-07-12 NOTE — Telephone Encounter (Signed)
Informed pt of results. Pt will check on price and call back if she can afford either medication or decides to take either medication.

## 2023-09-12 ENCOUNTER — Other Ambulatory Visit: Payer: Self-pay | Admitting: Family Medicine

## 2023-09-12 DIAGNOSIS — Z1231 Encounter for screening mammogram for malignant neoplasm of breast: Secondary | ICD-10-CM

## 2023-09-25 NOTE — Progress Notes (Signed)
 58 y.o. G1P1001 Single African American female here for annual exam.    Wants to know if her fibroids are ok.  No bleeding.   She had a CT scan 12/21/22 and she had a fibroid uterus with a 3.3 cm subserosal fibroid and a 1 cm either a submucuous fibroid or uterine polyp.   Has glaucoma in her left eye.  Seeing an eye doctor.   Feels depressed with bad weather.  Some anxiety. Started taking Zoloft a few days ago. PCP prescribing.  Declines suicidal ideation.   No partner change.  Desires STD screening.   Smoking.  Uses nicotine pouches orally.   PCP: Shade Flood, MD   Patient's last menstrual period was 10/06/2014.           Sexually active: No.  The current method of family planning is post menopausal status.    Menopausal hormone therapy:  n/a Exercising: Yes.     walking Smoker:  no  OB History  Gravida Para Term Preterm AB Living  1 1 1   1   SAB IAB Ectopic Multiple Live Births          # Outcome Date GA Lbr Len/2nd Weight Sex Type Anes PTL Lv  1 Term              HEALTH MAINTENANCE: Last 2 paps:  08/02/22 neg: HR HPV neg, 07/06/19 neg History of abnormal Pap or positive HPV:  no Mammogram:   12/07/22 Breast Density Cat B, BI-RADS CAT 1 neg Colonoscopy:  10/05/22 - neuroendocrine tumor of colon.  Bone Density:  n/a  Result  n/a   Immunization History  Administered Date(s) Administered   Influenza, Seasonal, Injecte, Preservative Fre 09/26/2023   Influenza,inj,Quad PF,6+ Mos 08/29/2020   PFIZER Comirnaty(Gray Top)Covid-19 Tri-Sucrose Vaccine 11/26/2019, 12/17/2019, 07/24/2020   PNEUMOCOCCAL CONJUGATE-20 09/26/2023   Pfizer Covid-19 Vaccine Bivalent Booster 72yrs & up 02/16/2021   Tdap 11/16/2021      reports that she has been smoking cigarettes. She has never used smokeless tobacco. She reports that she does not currently use alcohol after a past usage of about 5.0 standard drinks of alcohol per week. She reports that she does not use drugs.  Past  Medical History:  Diagnosis Date   Allergy    Anemia    Anxiety    Bell's palsy    Depression    Fibroid    Glaucoma    Open   Hypertension    Neuroendocrine tumor 2024   colon   Vertigo     Past Surgical History:  Procedure Laterality Date   BREAST EXCISIONAL BIOPSY Left    BREAST LUMPECTOMY WITH RADIOACTIVE SEED LOCALIZATION Left 01/12/2014   Procedure: LEFT BREAST LUMPECTOMY WITH RADIOACTIVE SEED LOCALIZATION;  Surgeon: Maisie Fus A. Cornett, MD;  Location: [facility];  Service: General;  Laterality: Left;   BREAST SURGERY     Cyst removed   ENDOSCOPIC MUCOSAL RESECTION N/A 01/08/2023   Procedure: ENDOSCOPIC MUCOSAL RESECTION;  Surgeon: Meridee Score Netty Starring., MD;  Location: [facility];  Service: Gastroenterology;  Laterality: N/A;   EUS N/A 01/08/2023   Procedure: LOWER ENDOSCOPIC ULTRASOUND (EUS);  Surgeon: Lemar Lofty., MD;  Location: [facility];  Service: Gastroenterology;  Laterality: N/A;   FLEXIBLE SIGMOIDOSCOPY N/A 01/08/2023   Procedure: FLEXIBLE SIGMOIDOSCOPY;  Surgeon: Meridee Score Netty Starring., MD;  Location: [facility];  Service: Gastroenterology;  Laterality: N/A;   HEMOSTASIS CLIP PLACEMENT  01/08/2023   Procedure: HEMOSTASIS CLIP PLACEMENT;  Surgeon: Meridee Score,  Netty Starring., MD;  Location: [facility];  Service: Gastroenterology;;   HYSTEROSCOPY  2002   myomectomy   MYOMECTOMY  10/2005   PELVIC LAPAROSCOPY     Expl. Lap.   REFRACTIVE SURGERY  07/26/2022   SUBMUCOSAL LIFTING INJECTION  01/08/2023   Procedure: SUBMUCOSAL LIFTING INJECTION;  Surgeon: Lemar Lofty., MD;  Location: [facility];  Service: Gastroenterology;;    Current Outpatient Medications  Medication Sig Dispense Refill   ACCU-CHEK GUIDE test strip TEST ONCE DAILY 100 strip 0   Accu-Chek Softclix Lancets lancets TEST ONCE DAILY 100 each 1   acetaminophen (TYLENOL) 325 MG tablet Take 2 tablets (650 mg total) by mouth every 6 (six) hours as needed for up to  30 doses for moderate pain or mild pain. 30 tablet 0   albuterol (VENTOLIN HFA) 108 (90 Base) MCG/ACT inhaler Inhale 2 puffs into the lungs every 6 (six) hours as needed (bronchitis/Asthma).     atorvastatin (LIPITOR) 10 MG tablet Take 1 tablet (10 mg total) by mouth daily. Okay to start once per week then slowly increase dosing as tolerated. 90 tablet 0   Biotin w/ Vitamins C & E (HAIR SKIN & NAILS GUMMIES PO) Take 2 Pieces of gum by mouth daily.     Blood Glucose Monitoring Suppl DEVI 1 each by Does not apply route daily. May substitute to any manufacturer covered by patient's insurance. 1 each 0   cyclobenzaprine (FLEXERIL) 10 MG tablet Take 1 tablet (10 mg total) by mouth 2 (two) times daily as needed for up to 14 doses for muscle spasms. 14 tablet 0   ibuprofen (ADVIL) 600 MG tablet Take 1 tablet (600 mg total) by mouth every 6 (six) hours as needed for up to 30 doses for headache or mild pain. 30 tablet 0   Multiple Vitamin (MULTIVITAMIN WITH MINERALS) TABS tablet Take 1 tablet by mouth daily.     prednisoLONE acetate (PRED FORTE) 1 % ophthalmic suspension Place 1 drop into the left eye daily.     sertraline (ZOLOFT) 25 MG tablet Take 1 tablet (25 mg total) by mouth daily. 90 tablet 1   No current facility-administered medications for this visit.    ALLERGIES: Latex and Metformin and related  Family History  Problem Relation Age of Onset   Hypertension Mother    Diabetes Mother    Stroke Brother    Migraines Brother    Migraines Brother    Heart disease Brother    Thyroid disease Brother    Cancer Maternal Uncle        Unknown type   Colon polyps Neg Hx    Colon cancer Neg Hx    Esophageal cancer Neg Hx    Stomach cancer Neg Hx    Rectal cancer Neg Hx     Review of Systems  All other systems reviewed and are negative.   PHYSICAL EXAM:  BP 132/82 (BP Location: Right Arm, Patient Position: Sitting, Cuff Size: Small)   Pulse 95   Ht 5\' 6"  (1.676 m)   Wt 159 lb (72.1 kg)    LMP 10/06/2014   SpO2 97%   BMI 25.66 kg/m     General appearance: alert, cooperative and appears stated age Head: normocephalic, without obvious abnormality, atraumatic Neck: no adenopathy, supple, symmetrical, trachea midline and thyroid normal to inspection and palpation Lungs: clear to auscultation bilaterally Breasts: normal appearance, no masses or tenderness, No nipple retraction or dimpling, No nipple discharge or bleeding, No axillary adenopathy Heart: regular  rate and rhythm Abdomen: soft, non-tender; no masses, no organomegaly Extremities: extremities normal, atraumatic, no cyanosis or edema Skin: skin color, texture, turgor normal. No rashes or lesions Lymph nodes: cervical, supraclavicular, and axillary nodes normal. Neurologic: grossly normal  Pelvic: External genitalia:  no lesions              No abnormal inguinal nodes palpated.              Urethra:  normal appearing urethra with no masses, tenderness or lesions              Bartholins and Skenes: normal                 Vagina: normal appearing vagina with normal color and discharge, no lesions              Cervix: no lesions              Pap taken: No. Bimanual Exam:  Uterus:  normal size, contour, position, consistency, mobility, non-tender              Adnexa: no mass, fullness, tenderness              Rectal exam: Yes.  .  Confirms.              Anus:  normal sphincter tone, no lesions  Chaperone was present for exam:  Warren Lacy, CMA  ASSESSMENT: Well woman visit with gynecologic exam. STD screening.  Fibroids on CT scan.  Vaginal atrophy. Hx left breast biopsy.  HTN.  Hx stroke.  Tobacco use.  Neuroendocrine tumor of colon.  Glaucoma.  Depression and anxiety.  Just started Zoloft.  PHQ-9: 6  PLAN: Mammogram screening discussed. Self breast awareness reviewed. Pap and HRV collected:  No.  Due in 2029  Guidelines for Calcium, Vitamin D, regular exercise program including cardiovascular and weight  bearing exercise. Medication refills:  NA STD screening.  We discussed transdermal nicotine patch as an alternative to oral nicotine.  Return for pelvic US.  Follow up:  yearly and prn.

## 2023-09-26 ENCOUNTER — Ambulatory Visit (INDEPENDENT_AMBULATORY_CARE_PROVIDER_SITE_OTHER): Payer: 59 | Admitting: Family Medicine

## 2023-09-26 ENCOUNTER — Encounter: Payer: Self-pay | Admitting: Family Medicine

## 2023-09-26 VITALS — BP 122/80 | HR 87 | Temp 99.7°F | Ht 67.0 in | Wt 155.0 lb

## 2023-09-26 DIAGNOSIS — E785 Hyperlipidemia, unspecified: Secondary | ICD-10-CM | POA: Diagnosis not present

## 2023-09-26 DIAGNOSIS — F418 Other specified anxiety disorders: Secondary | ICD-10-CM | POA: Diagnosis not present

## 2023-09-26 DIAGNOSIS — E1165 Type 2 diabetes mellitus with hyperglycemia: Secondary | ICD-10-CM | POA: Diagnosis not present

## 2023-09-26 DIAGNOSIS — E1169 Type 2 diabetes mellitus with other specified complication: Secondary | ICD-10-CM | POA: Diagnosis not present

## 2023-09-26 DIAGNOSIS — Z23 Encounter for immunization: Secondary | ICD-10-CM | POA: Diagnosis not present

## 2023-09-26 LAB — COMPREHENSIVE METABOLIC PANEL
ALT: 20 U/L (ref 0–35)
AST: 20 U/L (ref 0–37)
Albumin: 4.5 g/dL (ref 3.5–5.2)
Alkaline Phosphatase: 76 U/L (ref 39–117)
BUN: 8 mg/dL (ref 6–23)
CO2: 29 meq/L (ref 19–32)
Calcium: 9.6 mg/dL (ref 8.4–10.5)
Chloride: 104 meq/L (ref 96–112)
Creatinine, Ser: 0.9 mg/dL (ref 0.40–1.20)
GFR: 72 mL/min (ref >60.00)
Glucose, Bld: 78 mg/dL (ref 70–99)
Potassium: 4.3 meq/L (ref 3.5–5.1)
Sodium: 140 meq/L (ref 135–145)
Total Bilirubin: 0.4 mg/dL (ref 0.2–1.2)
Total Protein: 8 g/dL (ref 6.0–8.3)

## 2023-09-26 LAB — LIPID PANEL
Cholesterol: 204 mg/dL — ABNORMAL HIGH (ref 0–200)
HDL: 47.2 mg/dL (ref >39.00)
LDL Cholesterol: 121 mg/dL — ABNORMAL HIGH (ref 0–99)
NonHDL: 156.5
Total CHOL/HDL Ratio: 4
Triglycerides: 178 mg/dL — ABNORMAL HIGH (ref 0.0–149.0)
VLDL: 35.6 mg/dL (ref 0.0–40.0)

## 2023-09-26 LAB — HEMOGLOBIN A1C: Hgb A1c MFr Bld: 6.9 % — ABNORMAL HIGH (ref 4.6–6.5)

## 2023-09-26 MED ORDER — ATORVASTATIN CALCIUM 10 MG PO TABS: 10.0000 mg | ORAL_TABLET | Freq: Every day | ORAL | 0 refills | Status: DC

## 2023-09-26 MED ORDER — SERTRALINE HCL 25 MG PO TABS
25.0000 mg | ORAL_TABLET | Freq: Every day | ORAL | 1 refills | Status: DC
Start: 2023-09-26 — End: 2024-04-01

## 2023-09-26 MED ORDER — PNEUMOCOCCAL 20-VAL CONJ VACC 0.5 ML IM SUSY: 0.5000 mL | PREFILLED_SYRINGE | INTRAMUSCULAR | 0 refills | Status: AC

## 2023-09-26 NOTE — Progress Notes (Signed)
 Subjective:  Patient ID: Brandi Cooke, female    DOB: August 31, 1965  Age: 58 y.o. MRN: [id number]  CC:  Chief Complaint  Patient presents with   Diabetes    Per last visit rechecking A1C and possible medication change  Pt states everything is going okay, no concerns  Pt is fasting for labs     HPI Brandi Cooke presents for follow up. No health changes.   Diabetes: Complicated by hyperglycemia.  Did not tolerate metformin due to GI side effects.  [name] was cost prohibitive.  She made dietary changes and weight loss and had been losing weight at her November visit, with cutting back on portions.  More water and less soda.  Also  cut back on fast food. Weigh has continued to improve - down 5# form last visit. Better diet. Some exercise with walking and active at work, steps at home.  Statins have been recommended with diabetes and ASCVD risk score.  Lipitor has been ordered.  Some concern last visit about coverage. She has not started. Requests repeat referral. Approved for disability, medicare will start in July 2026. Currently on Medicaid.  Microalbumin: Normal ratio 03/22/2023 Optho, foot exam, pneumovax:  Appt with optho 3/14.  Foot exam today.  Prevnar 20 today.  Flu vaccine today.  Defers shingrix to next visit.  Covid booster - recommended.   Lab Results  Component Value Date   HGBA1C 7.3 (H) 06/25/2023   HGBA1C 7.2 (H) 03/22/2023   HGBA1C 7.5 (H) 10/04/2022   Lab Results  Component Value Date   MICROALBUR 0.9 03/22/2023   LDLCALC 145 (H) 10/04/2022   CREATININE 0.87 06/25/2023   Wt Readings from Last 3 Encounters:  09/26/23 155 lb (70.3 kg)  06/20/23 160 lb 3.2 oz (72.7 kg)  03/22/23 163 lb (73.9 kg)   Diabetic Foot Exam - Simple   Simple Foot Form Diabetic Foot exam was performed with the following findings: Yes 09/26/2023 11:46 AM  Visual Inspection No deformities, no ulcerations, no other skin breakdown bilaterally: Yes Sensation Testing Intact  to touch and monofilament testing bilaterally: Yes Pulse Check Posterior Tibialis and Dorsalis pulse intact bilaterally: Yes Comments      Hyperlipidemia: Lipitor 10 mg previously prescribed. Has not started as above. Agrees with new rx.  Fasting except.  Lab Results  Component Value Date   CHOL 231 (H) 03/22/2023   HDL 41.60 03/22/2023   LDLCALC 145 (H) 10/04/2022   LDLDIRECT 172.0 03/22/2023   TRIG 213.0 (H) 03/22/2023   CHOLHDL 6 03/22/2023   Lab Results  Component Value Date   ALT 20 06/25/2023   AST 18 06/25/2023   ALKPHOS 75 06/25/2023   BILITOT 0.3 06/25/2023   Anxiety Improving on last discussed in August.  Was only taking sertraline intermittently at that time, few days per week.  Low-dose of 25 mg without any side effects.  We discussed typical dosing of daily dosing, but also option to stop medication if symptoms controlled without medication use. Doing ok - anxiety better controlled. Taking every other day. Has not tried daily. No side effects.  Anxiety in car usually.   Congestion a week or two ago, better now. No fever.       09/26/2023   11:53 AM 06/20/2023    1:37 PM 03/22/2023   11:23 AM 02/28/2023    1:35 PM 11/08/2022   11:04 AM  Depression screen PHQ 2/9  Decreased Interest 2 1 0 1 2  Down, Depressed, Hopeless 2 0  0 1 2  PHQ - 2 Score 4 1 0 2 4  Altered sleeping 1 1 0 2 3  Tired, decreased energy 1 1 0 0 0  Change in appetite 2 0 0 0 0  Feeling bad or failure about yourself  3 0 0 1 3  Trouble concentrating 3 0 0 0 0  Moving slowly or fidgety/restless 0 0 0 0 0  Suicidal thoughts 0 0 0 0 0  PHQ-9 Score 14 3 0 5 10  Difficult doing work/chores     Not difficult at all      09/26/2023   11:54 AM 06/20/2023    1:37 PM 02/28/2023    1:36 PM 11/08/2022   11:05 AM  GAD 7 : Generalized Anxiety Score  Nervous, Anxious, on Edge 1 1 0 0  Control/stop worrying 3 0 0 0  Worry too much - different things 3 0 0 0  Trouble relaxing 0 1 1 1   Restless 2 0  0 0  Easily annoyed or irritable 2 1 0 1  Afraid - awful might happen 0 1 0 0  Total GAD 7 Score 11 4 1 2   Anxiety Difficulty Not difficult at all       History Patient Active Problem List   Diagnosis Date Noted   Granular cell tumor 12/07/2013   Fibroid    Bell's palsy    Glaucoma    Past Medical History:  Diagnosis Date   Allergy    Anemia    Bell's palsy    Fibroid    Glaucoma    Open   Hypertension    Vertigo    Past Surgical History:  Procedure Laterality Date   BREAST EXCISIONAL BIOPSY Left    BREAST LUMPECTOMY WITH RADIOACTIVE SEED LOCALIZATION Left 01/12/2014   Procedure: LEFT BREAST LUMPECTOMY WITH RADIOACTIVE SEED LOCALIZATION;  Surgeon: Maisie Fus A. Cornett, MD;  Location: [facility];  Service: General;  Laterality: Left;   BREAST SURGERY     Cyst removed   ENDOSCOPIC MUCOSAL RESECTION N/A 01/08/2023   Procedure: ENDOSCOPIC MUCOSAL RESECTION;  Surgeon: Meridee Score Netty Starring., MD;  Location: [facility];  Service: Gastroenterology;  Laterality: N/A;   EUS N/A 01/08/2023   Procedure: LOWER ENDOSCOPIC ULTRASOUND (EUS);  Surgeon: Lemar Lofty., MD;  Location: [facility];  Service: Gastroenterology;  Laterality: N/A;   FLEXIBLE SIGMOIDOSCOPY N/A 01/08/2023   Procedure: FLEXIBLE SIGMOIDOSCOPY;  Surgeon: Meridee Score Netty Starring., MD;  Location: [facility];  Service: Gastroenterology;  Laterality: N/A;   HEMOSTASIS CLIP PLACEMENT  01/08/2023   Procedure: HEMOSTASIS CLIP PLACEMENT;  Surgeon: Lemar Lofty., MD;  Location: [facility];  Service: Gastroenterology;;   HYSTEROSCOPY  2002   myomectomy   MYOMECTOMY  10/2005   PELVIC LAPAROSCOPY     Expl. Lap.   REFRACTIVE SURGERY  07/26/2022   SUBMUCOSAL LIFTING INJECTION  01/08/2023   Procedure: SUBMUCOSAL LIFTING INJECTION;  Surgeon: Meridee Score Netty Starring., MD;  Location: [facility];  Service: Gastroenterology;;   Allergies  Allergen Reactions   Latex Itching and Rash   Metformin  And Related Other (See Comments)    Diarrhea    Prior to Admission medications   Medication Sig Start Date End Date Taking? Authorizing Provider  ACCU-CHEK GUIDE test strip TEST ONCE DAILY 02/04/23   Shade Flood, MD  Accu-Chek Softclix Lancets lancets TEST ONCE DAILY 03/14/23   Shade Flood, MD  acetaminophen (TYLENOL) 325 MG tablet Take 2 tablets (650 mg total) by mouth every  6 (six) hours as needed for up to 30 doses for moderate pain or mild pain. 10/12/22   Terald Sleeper, MD  albuterol (VENTOLIN HFA) 108 (90 Base) MCG/ACT inhaler Inhale 2 puffs into the lungs every 6 (six) hours as needed (bronchitis/Asthma). 09/17/21   [provider]  atorvastatin (LIPITOR) 10 MG tablet Take 1 tablet (10 mg total) by mouth daily. Okay to start once per week then slowly increase dosing as tolerated. 02/28/23   Shade Flood, MD  Biotin w/ Vitamins C & E (HAIR SKIN & NAILS GUMMIES PO) Take 2 Pieces of gum by mouth daily.    [provider]  Blood Glucose Monitoring Suppl DEVI 1 each by Does not apply route daily. May substitute to any manufacturer covered by patient's insurance. 11/08/22   Shade Flood, MD  cyclobenzaprine (FLEXERIL) 10 MG tablet Take 1 tablet (10 mg total) by mouth 2 (two) times daily as needed for up to 14 doses for muscle spasms. 10/12/22   Terald Sleeper, MD  ibuprofen (ADVIL) 600 MG tablet Take 1 tablet (600 mg total) by mouth every 6 (six) hours as needed for up to 30 doses for headache or mild pain. 01/15/23   Mansouraty, Netty Starring., MD  prednisoLONE acetate (PRED FORTE) 1 % ophthalmic suspension Place 1 drop into the left eye daily. 11/09/22   [provider]  sertraline (ZOLOFT) 25 MG tablet TAKE 1 TABLET(25 MG) BY MOUTH DAILY 03/19/23   Shade Flood, MD   Social History   Socioeconomic History   Marital status: Single    Spouse name: Not on file   Number of children: Not on file   Years of education: Not on file   Highest  education level: Not on file  Occupational History   Not on file  Tobacco Use   Smoking status: Every Day    Current packs/day: 0.50    Types: Cigarettes   Smokeless tobacco: Never  Vaping Use   Vaping status: Never Used  Substance and Sexual Activity   Alcohol use: Not Currently    Alcohol/week: 5.0 standard drinks of alcohol    Types: 5 Cans of beer per week   Drug use: No   Sexual activity: Not Currently    Comment: 1st intercourse 58 yo-Fewer than 5 partners  Other Topics Concern   Not on file  Social History Narrative   Not on file   Social Drivers of Health   Financial Resource Strain: Not on file  Food Insecurity: No Food Insecurity (09/18/2022)   Hunger Vital Sign    Worried About Running Out of Food in the Last Year: Never true    Ran Out of Food in the Last Year: Never true  Transportation Needs: No Transportation Needs (09/18/2022)   PRAPARE - Administrator, Civil Service (Medical): No    Lack of Transportation (Non-Medical): No  Physical Activity: Not on file  Stress: Not on file  Social Connections: Not on file  Intimate Partner Violence: Not on file    Review of Systems   Objective:   Vitals:   09/26/23 1124  BP: 122/80  Pulse: 87  Temp: 99.7 F (37.6 C)  TempSrc: Temporal  SpO2: 97%  Weight: 155 lb (70.3 kg)  Height: 5\' 7"  (1.702 m)     Physical Exam     Assessment & Plan:  Brandi Cooke is a 58 y.o. female . Type 2 diabetes mellitus with hyperglycemia, without long-term current use  of insulin (HCC) - Plan: Hemoglobin A1c  -Check A1c and then decide on med adjustments if needed.  Currently diet/exercise approach with improved weight, commended on her efforts.  Hyperlipidemia associated with type 2 diabetes mellitus (HCC) - Plan: Comprehensive metabolic panel, Lipid panel, atorvastatin (LIPITOR) 10 MG tablet  -Check updated labs but discussed reasons for use of Lipitor.  Will send prescription to pharmacy again.  Should  not be an issue with coverage but advised to let me know if there is any issue filling that medication.  Anxiety with depression - Plan: sertraline (ZOLOFT) 25 MG tablet  -Improved but still based on PHQ, GAD 7, room for improvement.  Recommended daily dosing of the sertraline for now, option of higher dosing if persistent symptoms, 57-month recheck.  Flu vaccine need - Plan: Flu vaccine trivalent PF, 6mos and older(Flulaval,Afluria,Fluarix,Fluzone)  Need for shingles vaccine -will defer to next visit or at pharmacy.  Need for vaccination with 20-polyvalent pneumococcal conjugate vaccine - Plan: pneumococcal 20-valent conjugate vaccine (PREVNAR 20) 0.5 ML injection   Meds ordered this encounter  Medications   pneumococcal 20-valent conjugate vaccine (PREVNAR 20) 0.5 ML injection    Sig: Inject 0.5 mLs into the muscle tomorrow at 10 am for 1 dose.    Dispense:  0.5 mL    Refill:  0   atorvastatin (LIPITOR) 10 MG tablet    Sig: Take 1 tablet (10 mg total) by mouth daily. Okay to start once per week then slowly increase dosing as tolerated.    Dispense:  90 tablet    Refill:  0   sertraline (ZOLOFT) 25 MG tablet    Sig: Take 1 tablet (25 mg total) by mouth daily.    Dispense:  90 tablet    Refill:  1   Patient Instructions  Thank you for coming in today.  Try taking sertraline daily to see if that helps with anxiety and depression symptoms even further.  If any new side effects or worsening symptoms let me know but otherwise we can recheck this in 3 months.  I sent the cholesterol pill atorvastatin to your pharmacy, take that once daily.  If any new side effects let me know.  Depending on labs we can discuss if other changes needed but otherwise follow-up in 3 months.  Keep up the good work with weight loss, I think that will help your lab work.  Flu vaccine and pneumonia vaccine given today.  With your new insurance it may be best to have shingles vaccine at the pharmacy.  Let me know if  you have questions and take care    Signed,   Meredith Staggers, MD [facility], [facility] 09/26/23 12:03 PM

## 2023-09-26 NOTE — Patient Instructions (Addendum)
 Thank you for coming in today.  Try taking sertraline daily to see if that helps with anxiety and depression symptoms even further.  If any new side effects or worsening symptoms let me know but otherwise we can recheck this in 3 months.  I sent the cholesterol pill atorvastatin to your pharmacy, take that once daily.  If any new side effects let me know.  Depending on labs we can discuss if other changes needed but otherwise follow-up in 3 months.  Keep up the good work with weight loss, I think that will help your lab work.  Flu vaccine and pneumonia vaccine given today.  With your new insurance it may be best to have shingles vaccine at the pharmacy.  Let me know if you have questions and take care

## 2023-10-03 ENCOUNTER — Encounter: Payer: Self-pay | Admitting: Family Medicine

## 2023-10-09 ENCOUNTER — Ambulatory Visit (INDEPENDENT_AMBULATORY_CARE_PROVIDER_SITE_OTHER): Payer: Medicaid Other | Admitting: Obstetrics and Gynecology

## 2023-10-09 ENCOUNTER — Encounter: Payer: Self-pay | Admitting: Obstetrics and Gynecology

## 2023-10-09 ENCOUNTER — Other Ambulatory Visit (HOSPITAL_COMMUNITY)
Admission: RE | Admit: 2023-10-09 | Discharge: 2023-10-09 | Disposition: A | Discharge status: Home / Self Care | Source: Ambulatory Visit | Attending: Obstetrics and Gynecology | Admitting: Obstetrics and Gynecology

## 2023-10-09 VITALS — BP 132/82 | HR 95 | Ht 66.0 in | Wt 159.0 lb

## 2023-10-09 DIAGNOSIS — Z113 Encounter for screening for infections with a predominantly sexual mode of transmission: Secondary | ICD-10-CM | POA: Insufficient documentation

## 2023-10-09 DIAGNOSIS — D219 Benign neoplasm of connective and other soft tissue, unspecified: Secondary | ICD-10-CM | POA: Diagnosis not present

## 2023-10-09 DIAGNOSIS — D3A8 Other benign neuroendocrine tumors: Secondary | ICD-10-CM

## 2023-10-09 DIAGNOSIS — Z01419 Encounter for gynecological examination (general) (routine) without abnormal findings: Secondary | ICD-10-CM

## 2023-10-09 DIAGNOSIS — Z114 Encounter for screening for human immunodeficiency virus [HIV]: Secondary | ICD-10-CM

## 2023-10-09 DIAGNOSIS — Z1331 Encounter for screening for depression: Secondary | ICD-10-CM

## 2023-10-09 DIAGNOSIS — Z1159 Encounter for screening for other viral diseases: Secondary | ICD-10-CM

## 2023-10-09 DIAGNOSIS — F419 Anxiety disorder, unspecified: Secondary | ICD-10-CM | POA: Insufficient documentation

## 2023-10-09 NOTE — Telephone Encounter (Signed)
-----   Message from Shade Flood sent at 10/08/2023 12:15 PM EDT ----- Results sent by MyChart, but appears patient has not yet reviewed those results.  Please call and make sure they have either seen note or discuss result note.  Thanks.

## 2023-10-09 NOTE — Telephone Encounter (Signed)
 Called Patient for lab results, Was not able to leave a VM for call to be returned. VM box is not set up.

## 2023-10-09 NOTE — Patient Instructions (Signed)

## 2023-10-10 LAB — HEPATITIS C ANTIBODY: Hepatitis C Ab: NONREACTIVE

## 2023-10-10 LAB — CERVICOVAGINAL ANCILLARY ONLY
Chlamydia: NEGATIVE
Comment: NEGATIVE
Comment: NEGATIVE
Comment: NORMAL
Neisseria Gonorrhea: NEGATIVE
Trichomonas: NEGATIVE

## 2023-10-10 LAB — RPR: RPR Ser Ql: NONREACTIVE

## 2023-10-10 LAB — HIV ANTIBODY (ROUTINE TESTING W REFLEX): HIV 1&2 Ab, 4th Generation: NONREACTIVE

## 2023-10-10 NOTE — Telephone Encounter (Signed)
 This is a note sent by MyChart. Patient is to call for any questions and concerns

## 2023-10-11 ENCOUNTER — Encounter: Payer: Self-pay | Admitting: Obstetrics and Gynecology

## 2023-10-21 ENCOUNTER — Telehealth: Payer: Self-pay | Admitting: Gastroenterology

## 2023-10-21 NOTE — Telephone Encounter (Signed)
 Tried to call the patient back. No answer. No voicemail. Wanted to let the patient know that she will be contacted some time in the next 2 weeks by Hilma Favors RN regarding scheduling.

## 2023-10-21 NOTE — Telephone Encounter (Signed)
 Patient called and stated that the nurse had called her but did not know why. I advise the patient per the information below and asked her if she had any question. Patient stated that she did not and that she will await for the 2 weeks for Hilma Favors RN to get in contact with her. Please advise.

## 2023-10-21 NOTE — Telephone Encounter (Signed)
 Inbound call from patient wishing to discuss scheduling recall EUS. Requesting a Wednesday or Thursday in June. Please advise, thank you.

## 2023-10-23 ENCOUNTER — Encounter: Payer: Self-pay | Admitting: Obstetrics and Gynecology

## 2023-10-28 NOTE — Telephone Encounter (Signed)
 Dr Meridee Score last year you had asked for a lower EUS to be done in 1 year but the patient path letter says colon.  Can you clarify which she needs?   "I recommend that you have a repeat colonoscopy in 1 year. If all looks well at that time, we will plan to follow you up for an additional 5 years."

## 2023-10-28 NOTE — Telephone Encounter (Signed)
 Lower EUS for hx Rectal NET. Thanks. GM

## 2023-10-29 NOTE — Telephone Encounter (Signed)
Lower EUS scheduled, pt instructed and medications reviewed.  Patient instructions mailed to home.  Patient to call with any questions or concerns.

## 2023-10-29 NOTE — Telephone Encounter (Signed)
 Lower EUS has been set up for 11/21/23 at [facility] with GM at 11 am

## 2023-11-06 ENCOUNTER — Telehealth: Payer: Self-pay | Admitting: Obstetrics and Gynecology

## 2023-11-06 NOTE — Telephone Encounter (Signed)
 Dr Edward Jolly has placed order for patient to schedule ultrasound. Tried twice to contact patient by phone her voicemail is not set up. Sent MyChart message and letter mailed for patient to call and schedule ultrasound appointment; patient has not called back.

## 2023-11-13 ENCOUNTER — Telehealth: Payer: Self-pay | Admitting: Gastroenterology

## 2023-11-13 NOTE — Telephone Encounter (Addendum)
 Procedure:Lower Endoscopic Ultrasound (EUS) Procedure date: 11/21/23 Procedure location: [facility] Arrival Time: 9:30 am Spoke with the patient Y/N:   No, Unable to leave a message, mailbox have not been set up 11/13/23 @ 10:30 am No, Unable to leave a message, mailbox have not been set up 11/14/23 @ 11:38 am Mychart message sent 11/18/23 @ 10:02 am  Any prep concerns? ___  Has the patient obtained the prep from the pharmacy ? ___ Do you have a care partner and transportation: ___ Any additional concerns? ___

## 2023-11-15 ENCOUNTER — Encounter (HOSPITAL_COMMUNITY): Payer: Self-pay | Admitting: Gastroenterology

## 2023-11-18 NOTE — Telephone Encounter (Signed)
 Appt has been cancelled. Will call pt with new date and time

## 2023-11-18 NOTE — Telephone Encounter (Signed)
 PT is sick and would like to reschedule her EUS for 4/24 at [facility]. Please advise.

## 2023-11-18 NOTE — Telephone Encounter (Signed)
 Noted pt will be called with new appt date and time

## 2023-11-20 ENCOUNTER — Other Ambulatory Visit: Payer: Self-pay

## 2023-11-20 DIAGNOSIS — D3A8 Other benign neuroendocrine tumors: Secondary | ICD-10-CM

## 2023-11-20 NOTE — Telephone Encounter (Signed)
 EUS scheduled, pt instructed and medications reviewed.  Patient instructions mailed to home.  Patient to call with any questions or concerns.

## 2023-11-20 NOTE — Telephone Encounter (Signed)
 Spoke with patient, PUS scheduled for 12/10/23 at 0930, consult to follow at 1000 with Dr. Colvin Dec. Patient is agreeable to date and time.   Routing to provider for final review. Patient is agreeable to disposition. Will close encounter.

## 2023-11-20 NOTE — Telephone Encounter (Signed)
 Appt has been moved to 01/16/24 at 8 am at [facility] with GM

## 2023-11-28 DIAGNOSIS — J45909 Unspecified asthma, uncomplicated: Secondary | ICD-10-CM

## 2023-11-28 HISTORY — DX: Unspecified asthma, uncomplicated: J45.909

## 2023-12-09 ENCOUNTER — Ambulatory Visit
Admission: RE | Admit: 2023-12-09 | Discharge: 2023-12-09 | Disposition: A | Discharge status: Home / Self Care | Payer: 59 | Source: Ambulatory Visit | Attending: Family Medicine | Admitting: Family Medicine

## 2023-12-09 DIAGNOSIS — Z1231 Encounter for screening mammogram for malignant neoplasm of breast: Secondary | ICD-10-CM

## 2023-12-10 ENCOUNTER — Encounter: Payer: Self-pay | Admitting: Obstetrics and Gynecology

## 2023-12-10 ENCOUNTER — Ambulatory Visit (INDEPENDENT_AMBULATORY_CARE_PROVIDER_SITE_OTHER)

## 2023-12-10 ENCOUNTER — Ambulatory Visit (INDEPENDENT_AMBULATORY_CARE_PROVIDER_SITE_OTHER): Admitting: Obstetrics and Gynecology

## 2023-12-10 VITALS — BP 118/82 | HR 92

## 2023-12-10 DIAGNOSIS — D219 Benign neoplasm of connective and other soft tissue, unspecified: Secondary | ICD-10-CM | POA: Diagnosis not present

## 2023-12-10 DIAGNOSIS — R9389 Abnormal findings on diagnostic imaging of other specified body structures: Secondary | ICD-10-CM

## 2023-12-10 NOTE — Patient Instructions (Signed)
 Endometrial Biopsy  An endometrial biopsy is a procedure where a tissue sample is removed from the lining of the uterus. This lining is called the endometrium. The tissue sample is then sent to a lab for testing. You may have this type of biopsy to check for: Cancer. Infection. Growths called polyps. Uterine bleeding that can't be explained. Tell a health care provider about: Any allergies you have. All medicines you're taking including vitamins, herbs, eye drops, creams, and over-the-counter medicines. Any problems you or family members have had with anesthesia. Any bleeding problems you have. Any surgeries you have had. Any medical problems you have. Whether you're pregnant or may be pregnant. What are the risks? Your health care provider will talk with you about risks. These may include: Bleeding. Infection. Allergic reactions to medicines. Damage to the wall of the uterus. This is rare. What happens before the procedure? Keep track of your period. You may need to have this biopsy when you're not having your period. Ask your provider about: Changing or stopping your regular medicines. These include any diabetes medicines or blood thinners you take. Taking medicines such as aspirin and ibuprofen. These medicines can thin your blood. Do not take them unless your provider tells you to. Taking over-the-counter medicines, vitamins, herbs, and supplements. Bring a pad with you. You may need to wear one after the biopsy. Plan to have a responsible adult take you home from the hospital or clinic. You won't be allowed to drive. What happens during the procedure? A tool will be put into your vagina to hold it open. This helps your provider see the cervix. The cervix is the lowest part of the uterus. Your cervix will be cleaned with a solution that kills germs. You will be given anesthesia. This keeps you from feeling pain. It will numb your cervix. A tool called forceps will be used to  hold your cervix steady. A thin tool called a uterine sound will be put through your cervix. It will be used to: Find the length of your uterus. Find where to take the sample from. A soft tube called a catheter will be put into your uterus. The catheter will remove a tissue sample. The tube and tools will be removed. The sample will be sent to a lab for testing. The procedure may vary among providers and hospitals. What happens after the procedure? Your blood pressure, heart rate, breathing rate, and blood oxygen level will be monitored until you leave the hospital or clinic. It's up to you to get the results of your procedure. Ask your provider, or the department that is doing the procedure, when your results will be ready. This information is not intended to replace advice given to you by your health care provider. Make sure you discuss any questions you have with your health care provider. Document Revised: 09/25/2022 Document Reviewed: 09/25/2022 Elsevier Patient Education  2024 [employer]  Sonohysterogram  A sonohysterogram is a procedure to look at the inside of the uterus (womb). This procedure uses sound waves that are sent to a computer to make images of the lining of the uterus (endometrium). To get the best images, a germ-free (sterile) saline solution is put into the uterus through the vagina. This solution is made of salt and water. You may have this procedure if you have certain reproductive problems, such as: Abnormal uterine bleeding. Infertility. Repeated miscarriage. This procedure can show what may be causing these problems. Possible causes include scarring or abnormal growths, such as  fibroids or polyps, inside your uterus. It can also show if your uterus is an abnormal shape, or if there are any problems with the lining of your uterus. Tell a health care provider about: Any allergies you have. All medicines you are taking, including vitamins, herbs, eye drops,  creams, and over-the-counter medicines. The date of the first day of your last menstrual period or whether you are pregnant or may be pregnant. The procedure will not be done if you are pregnant. Any signs of infection, such as fever, pain in your lower abdomen, or abnormal discharge from your vagina. The procedure will not be done if you have an infection. Any bleeding problems you have. Any medical conditions or surgeries you have had. Any problems you or family members have had with anesthetic medicines. What are the risks? Generally, this is a safe procedure. However, problems may occur, including: Pain or fever a day or two after the procedure. Increased vaginal discharge. Infection. What happens before the procedure? You may be asked to take a pregnancy test. This is usually in the form of a urine test. You may be given medicine to stop any abnormal bleeding. Your health care provider may have you take an over-the-counter pain medicine. You will be asked to empty your bladder. You will be asked to undress from the waist down. You will lie down on the exam table with your feet in stirrups or with your knees bent and your feet flat on the table. You may have a pelvic exam. What happens during the procedure? You will have a transvaginal ultrasound. This is a test that uses sound waves to take pictures of the female genital tract. The pictures are taken with a device, called a transducer, that is placed in the vagina. For this test: The transducer will have a slippery substance put on it and will be placed into your vagina. The transducer will be positioned to send sound waves to your uterus. The sound waves will be sent to a computer and turned into images, which your health care provider will see during the procedure. The transducer will be removed from your vagina. Fluid will be put into your uterus. To do this: An instrument called a speculum will be put into your vagina to widen the  opening. A swab with germ-killing (antiseptic) saline solution will be used to clean the opening to your uterus (cervix). A long, thin tube (catheter) will be placed through your cervix into your uterus, and the speculum will be removed. The transvaginal ultrasound will be repeated. The ultrasound transducer will be put into your vagina again to take more images. Your uterus will be slowly filled with a sterile saline solution through the catheter. You may feel some cramping. If your fallopian tubes need to be checked, fluid containing air bubbles will be placed. The transducer and catheter will be removed. The procedure may vary among health care providers and hospitals. What can I expect after the procedure? Your blood pressure, heart rate, breathing rate, and blood oxygen level will be monitored until you leave the hospital or clinic. It is up to you to get the results of your procedure. Ask your health care provider, or the department that is doing the procedure, when your results will be ready. Contact a health care provider if you have: Chills or a fever. Pain or cramping that does not go away even with medicine. An increase in vaginal discharge. Vaginal discharge that is yellow or green in color and has  a bad smell. Nausea. Get help right away if you have: Severe pain in your abdomen. Heavy bleeding from your vagina. These symptoms may be an emergency. Get help right away. Call 911. Do not wait to see if the symptoms will go away. Do not drive yourself to the hospital. Summary A sonohysterogram is a procedure that creates images of the inside of the uterus. You may have this procedure if you have certain reproductive problems, such as abnormal bleeding, infertility, or repeated miscarriage. You may need to have a pelvic exam and take a pregnancy test before this procedure. The procedure will not be done if you are pregnant or have an infection. It is up to you to get the results of  your procedure. Ask your health care provider, or the department that is doing the procedure, when your results will be ready. This information is not intended to replace advice given to you by your health care provider. Make sure you discuss any questions you have with your health care provider. Document Revised: 09/19/2021 Document Reviewed: 09/19/2021 Elsevier Patient Education  2024 [employer].

## 2023-12-10 NOTE — Progress Notes (Signed)
 GYNECOLOGY  VISIT   HPI: 58 y.o.   Single  African American female   G1P1001 with Patient's last menstrual period was 10/06/2014.   here for: U/S consult     She had a CT scan 12/21/22 done due to neuroendocrine tumor noted at time of colonoscopy.  GYN findings were a fibroid uterus with a 3.3 cm subserosal fibroid and a 1 cm either a submucuous fibroid or uterine polyp.   No bleeding or spotting.  Occasional pelvic pain, sharp, that comes and goes.   Having US  lower GI tract in hospital with GI on 01/26/24. Hx neuroendocrine tumor.   Patient shared pictures of her son today.  GYNECOLOGIC HISTORY: Patient's last menstrual period was 10/06/2014. Contraception:  PMP Menopausal hormone therapy:  n/a Last 2 paps:  08/02/22 ne HR HPV neg, 07/06/19 neg  History of abnormal Pap or positive HPV:  no Mammogram:  12/09/23         OB History     Gravida  1   Para  1   Term  1   Preterm      AB      Living  1      SAB      IAB      Ectopic      Multiple      Live Births                 Patient Active Problem List   Diagnosis Date Noted   Anxiety    Neuroendocrine tumor 2024   Granular cell tumor 12/07/2013   Fibroid    Bell's palsy    Glaucoma     Past Medical History:  Diagnosis Date   Allergy    Anemia    Anxiety    Bell's palsy    Depression    Fibroid    Glaucoma    Open   Hypertension    Neuroendocrine tumor 2024   colon   Vertigo     Past Surgical History:  Procedure Laterality Date   BREAST EXCISIONAL BIOPSY Left    BREAST LUMPECTOMY WITH RADIOACTIVE SEED LOCALIZATION Left 01/12/2014   Procedure: LEFT BREAST LUMPECTOMY WITH RADIOACTIVE SEED LOCALIZATION;  Surgeon: Andy Bannister A. Cornett, MD;  Location: [facility];  Service: General;  Laterality: Left;   BREAST SURGERY     Cyst removed   ENDOSCOPIC MUCOSAL RESECTION N/A 01/08/2023   Procedure: ENDOSCOPIC MUCOSAL RESECTION;  Surgeon: Brice Campi Albino Alu., MD;  Location: [facility];  Service: Gastroenterology;  Laterality: N/A;   EUS N/A 01/08/2023   Procedure: LOWER ENDOSCOPIC ULTRASOUND (EUS);  Surgeon: Normie Becton., MD;  Location: [facility];  Service: Gastroenterology;  Laterality: N/A;   FLEXIBLE SIGMOIDOSCOPY N/A 01/08/2023   Procedure: FLEXIBLE SIGMOIDOSCOPY;  Surgeon: Brice Campi Albino Alu., MD;  Location: [facility];  Service: Gastroenterology;  Laterality: N/A;   HEMOSTASIS CLIP PLACEMENT  01/08/2023   Procedure: HEMOSTASIS CLIP PLACEMENT;  Surgeon: Normie Becton., MD;  Location: [facility];  Service: Gastroenterology;;   HYSTEROSCOPY  2002   myomectomy   MYOMECTOMY  10/2005   PELVIC LAPAROSCOPY     Expl. Lap.   REFRACTIVE SURGERY  07/26/2022   SUBMUCOSAL LIFTING INJECTION  01/08/2023   Procedure: SUBMUCOSAL LIFTING INJECTION;  Surgeon: Brice Campi Albino Alu., MD;  Location: [facility];  Service: Gastroenterology;;    Current Outpatient Medications  Medication Sig Dispense Refill   ACCU-CHEK GUIDE test strip TEST ONCE DAILY 100 strip 0   Accu-Chek Softclix  Lancets lancets TEST ONCE DAILY 100 each 1   acetaminophen  (TYLENOL ) 325 MG tablet Take 2 tablets (650 mg total) by mouth every 6 (six) hours as needed for up to 30 doses for moderate pain or mild pain. 30 tablet 0   albuterol  (VENTOLIN  HFA) 108 (90 Base) MCG/ACT inhaler Inhale 2 puffs into the lungs every 6 (six) hours as needed (bronchitis/Asthma).     atorvastatin  (LIPITOR) 10 MG tablet Take 1 tablet (10 mg total) by mouth daily. Okay to start once per week then slowly increase dosing as tolerated. 90 tablet 0   Biotin w/ Vitamins C & E (HAIR SKIN & NAILS GUMMIES PO) Take 2 Pieces of gum by mouth daily.     Blood Glucose Monitoring Suppl DEVI 1 each by Does not apply route daily. May substitute to any manufacturer covered by patient's insurance. 1 each 0   cyclobenzaprine  (FLEXERIL ) 10 MG tablet Take 1 tablet (10 mg total) by mouth 2 (two) times daily as needed for up  to 14 doses for muscle spasms. 14 tablet 0   ibuprofen  (ADVIL ) 600 MG tablet Take 1 tablet (600 mg total) by mouth every 6 (six) hours as needed for up to 30 doses for headache or mild pain. 30 tablet 0   Multiple Vitamin (MULTIVITAMIN WITH MINERALS) TABS tablet Take 1 tablet by mouth daily.     prednisoLONE acetate (PRED FORTE) 1 % ophthalmic suspension Place 1 drop into the left eye daily.     sertraline  (ZOLOFT ) 25 MG tablet Take 1 tablet (25 mg total) by mouth daily. 90 tablet 1   No current facility-administered medications for this visit.     ALLERGIES: Latex and Metformin  and related  Family History  Problem Relation Age of Onset   Hypertension Mother    Diabetes Mother    Stroke Brother    Migraines Brother    Migraines Brother    Heart disease Brother    Thyroid disease Brother    Cancer Maternal Uncle        Unknown type   Colon polyps Neg Hx    Colon cancer Neg Hx    Esophageal cancer Neg Hx    Stomach cancer Neg Hx    Rectal cancer Neg Hx     Social History   Socioeconomic History   Marital status: Single    Spouse name: Not on file   Number of children: Not on file   Years of education: Not on file   Highest education level: Not on file  Occupational History   Not on file  Tobacco Use   Smoking status: Every Day    Current packs/day: 0.50    Types: Cigarettes   Smokeless tobacco: Never  Vaping Use   Vaping status: Never Used  Substance and Sexual Activity   Alcohol use: Not Currently    Alcohol/week: 5.0 standard drinks of alcohol    Types: 5 Cans of beer per week   Drug use: No   Sexual activity: Not Currently    Comment: 1st intercourse 58 yo-Fewer than 5 partners  Other Topics Concern   Not on file  Social History Narrative   Not on file   Social Drivers of Health   Financial Resource Strain: Not on file  Food Insecurity: No Food Insecurity (09/18/2022)   Hunger Vital Sign    Worried About Running Out of Food in the Last Year: Never true     Ran Out of Food in the Last Year: Never true  Transportation Needs: No Transportation Needs (09/18/2022)   PRAPARE - Administrator, Civil Service (Medical): No    Lack of Transportation (Non-Medical): No  Physical Activity: Not on file  Stress: Not on file  Social Connections: Not on file  Intimate Partner Violence: Not on file    Review of Systems  See HPI.  PHYSICAL EXAMINATION:   BP 118/82 (BP Location: Left Arm, Patient Position: Sitting, Cuff Size: Normal)   Pulse 92   LMP 10/06/2014   SpO2 97%     General appearance: alert, cooperative and appears stated age   Pelvic US  Uterus 7.40 x 6.01 x 5.20 cm. Multiple fibroids:  0.95 cm, 0.66 cm, 1.36 cm, 1.36 cm, 2.97 cm, 0.78 cm.  Largest fibroid is left lateral 3.2 cm.  Submucosal fibroid 1.1 cm. EMS 12.06 mm.  Cystic areas seen with possible feeder vessel.  Also suspected submucous fibroid 1.1 cm.  Left ovary:  2.72 x 1.69 x 1.37 cm.  Normal.  Right ovary:  2.28 x 1.63 x 1.33 cm.  Normal.  No adnexal masses.  No free fluid.   ASSESSMENT:  Endometrial thickening.  Fibroids.   PLAN:  Pelvic US  images and report reviewed.  Fibroids discussed.  Return for sonohysterogram and endometrial biopsy.   Procedures explained.    25 min  total time was spent for this patient encounter, including preparation, face-to-face counseling with the patient, coordination of care, and documentation of the encounter.

## 2023-12-26 ENCOUNTER — Encounter: Payer: Self-pay | Admitting: Family Medicine

## 2023-12-26 ENCOUNTER — Ambulatory Visit: Payer: 59 | Admitting: Family Medicine

## 2023-12-26 VITALS — BP 108/64 | HR 79 | Temp 97.8°F | Resp 14 | Ht 66.0 in | Wt 161.6 lb

## 2023-12-26 DIAGNOSIS — F418 Other specified anxiety disorders: Secondary | ICD-10-CM | POA: Diagnosis not present

## 2023-12-26 DIAGNOSIS — J45909 Unspecified asthma, uncomplicated: Secondary | ICD-10-CM | POA: Diagnosis not present

## 2023-12-26 DIAGNOSIS — E785 Hyperlipidemia, unspecified: Secondary | ICD-10-CM | POA: Diagnosis not present

## 2023-12-26 DIAGNOSIS — E1165 Type 2 diabetes mellitus with hyperglycemia: Secondary | ICD-10-CM

## 2023-12-26 DIAGNOSIS — F17210 Nicotine dependence, cigarettes, uncomplicated: Secondary | ICD-10-CM

## 2023-12-26 DIAGNOSIS — E1169 Type 2 diabetes mellitus with other specified complication: Secondary | ICD-10-CM | POA: Diagnosis not present

## 2023-12-26 LAB — LIPID PANEL
Cholesterol: 207 mg/dL — ABNORMAL HIGH (ref 0–200)
HDL: 50.9 mg/dL (ref >39.00)
LDL Cholesterol: 135 mg/dL — ABNORMAL HIGH (ref 0–99)
NonHDL: 155.84
Total CHOL/HDL Ratio: 4
Triglycerides: 105 mg/dL (ref 0.0–149.0)
VLDL: 21 mg/dL (ref 0.0–40.0)

## 2023-12-26 LAB — COMPREHENSIVE METABOLIC PANEL WITH GFR
ALT: 17 U/L (ref 0–35)
AST: 19 U/L (ref 0–37)
Albumin: 4.3 g/dL (ref 3.5–5.2)
Alkaline Phosphatase: 65 U/L (ref 39–117)
BUN: 12 mg/dL (ref 6–23)
CO2: 27 meq/L (ref 19–32)
Calcium: 9.2 mg/dL (ref 8.4–10.5)
Chloride: 106 meq/L (ref 96–112)
Creatinine, Ser: 0.83 mg/dL (ref 0.40–1.20)
GFR: 79.21 mL/min (ref >60.00)
Glucose, Bld: 113 mg/dL — ABNORMAL HIGH (ref 70–99)
Potassium: 3.9 meq/L (ref 3.5–5.1)
Sodium: 141 meq/L (ref 135–145)
Total Bilirubin: 0.3 mg/dL (ref 0.2–1.2)
Total Protein: 7.3 g/dL (ref 6.0–8.3)

## 2023-12-26 LAB — HEMOGLOBIN A1C: Hgb A1c MFr Bld: 7.3 % — ABNORMAL HIGH (ref 4.6–6.5)

## 2023-12-26 MED ORDER — ATORVASTATIN CALCIUM 10 MG PO TABS: 10.0000 mg | ORAL_TABLET | Freq: Every day | ORAL | 0 refills | Status: DC

## 2023-12-26 MED ORDER — ALBUTEROL SULFATE HFA 108 (90 BASE) MCG/ACT IN AERS: 2.0000 | INHALATION_SPRAY | Freq: Four times a day (QID) | RESPIRATORY_TRACT | 0 refills | Status: AC | PRN

## 2023-12-26 NOTE — Progress Notes (Unsigned)
 Subjective:  Patient ID: Brandi Cooke, female    DOB: 08-04-1965  Age: 58 y.o. MRN: [id number]  CC:  Chief Complaint  Patient presents with   Medical Management of Chronic Issues    Pt is doing well, no concerns per patient     HPI Brandi Cooke presents for   Diabetes: Complicated by hyperglycemia.  Intolerant to metformin  due to gastrointestinal side effects and Jardiance  was cost prohibitive.  She had made some significant changes with diet and weight had been improving.  A1c had also improved in February at 6.9. no meds.  We have discussed statins including with diabetes and her ASCVD risk score.  Lipitor was ordered previously, not taken.  Recommended again at her February visit.  New prescription given. Weight is similar, up 2 pounds from last visit. Home readings - none Taking statin 2 times per week - no side effects.   Microalbumin: Normal ratio 03/22/2023. Optho, foot exam, pneumovax: Ophtho appointment in March.  Foot exam last visit.  Prevnar last visit.   Lab Results  Component Value Date   HGBA1C 6.9 (H) 09/26/2023   HGBA1C 7.3 (H) 06/25/2023   HGBA1C 7.2 (H) 03/22/2023   Lab Results  Component Value Date   MICROALBUR 0.9 03/22/2023   LDLCALC 121 (H) 09/26/2023   CREATININE 0.90 09/26/2023   Wt Readings from Last 3 Encounters:  12/26/23 161 lb 9.6 oz (73.3 kg)  10/09/23 159 lb (72.1 kg)  09/26/23 155 lb (70.3 kg)     Anxiety Discussed in February, doing okay at that time, improved control with taking sertraline  25 mg every other day.  Option of daily use discussed.  Typically anxiety flares with driving in car.  Denies any side effects with medications.  GAD-7 score same as last visit. Sertraline  - taking 3-4 times per week. No side effects.  Forgets to take at times.  Does not have pill minder - recommended.       12/26/2023   10:40 AM 09/26/2023   11:54 AM 06/20/2023    1:37 PM 02/28/2023    1:36 PM  GAD 7 : Generalized Anxiety Score   Nervous, Anxious, on Edge 2 1 1  0  Control/stop worrying 2 3 0 0  Worry too much - different things 2 3 0 0  Trouble relaxing 2 0 1 1  Restless 1 2 0 0  Easily annoyed or irritable 2 2 1  0  Afraid - awful might happen 0 0 1 0  Total GAD 7 Score 11 11 4 1   Anxiety Difficulty Somewhat difficult Not difficult at all     Nicotine addiction: 1/2-1ppd.  Smoker for 40 years, 1/2 ppd on average.  Thoughts of quitting, has patches to help quit when ready.  Has albuterol  inhaler - only using about once per month. Helps when used. Hx of asthma, bronchitis.  Has info on quitting smoking - declines additional info today.   HM: Shingrix discussed - declines until visit when she does not work the next day.     History Patient Active Problem List   Diagnosis Date Noted   Anxiety    Neuroendocrine tumor 2024   Granular cell tumor 12/07/2013   Fibroid    Bell's palsy    Glaucoma    Past Medical History:  Diagnosis Date   Allergy    Anemia    Anxiety    Bell's palsy    Depression    Fibroid    Glaucoma    Open  Hypertension    Neuroendocrine tumor 2024   colon   Vertigo    Past Surgical History:  Procedure Laterality Date   BREAST EXCISIONAL BIOPSY Left    BREAST LUMPECTOMY WITH RADIOACTIVE SEED LOCALIZATION Left 01/12/2014   Procedure: LEFT BREAST LUMPECTOMY WITH RADIOACTIVE SEED LOCALIZATION;  Surgeon: Andy Bannister A. Cornett, MD;  Location: [facility];  Service: General;  Laterality: Left;   BREAST SURGERY     Cyst removed   ENDOSCOPIC MUCOSAL RESECTION N/A 01/08/2023   Procedure: ENDOSCOPIC MUCOSAL RESECTION;  Surgeon: Brice Campi Albino Alu., MD;  Location: [facility];  Service: Gastroenterology;  Laterality: N/A;   EUS N/A 01/08/2023   Procedure: LOWER ENDOSCOPIC ULTRASOUND (EUS);  Surgeon: Normie Becton., MD;  Location: [facility];  Service: Gastroenterology;  Laterality: N/A;   FLEXIBLE SIGMOIDOSCOPY N/A 01/08/2023   Procedure: FLEXIBLE  SIGMOIDOSCOPY;  Surgeon: Brice Campi Albino Alu., MD;  Location: [facility];  Service: Gastroenterology;  Laterality: N/A;   HEMOSTASIS CLIP PLACEMENT  01/08/2023   Procedure: HEMOSTASIS CLIP PLACEMENT;  Surgeon: Normie Becton., MD;  Location: [facility];  Service: Gastroenterology;;   HYSTEROSCOPY  2002   myomectomy   MYOMECTOMY  10/2005   PELVIC LAPAROSCOPY     Expl. Lap.   REFRACTIVE SURGERY  07/26/2022   SUBMUCOSAL LIFTING INJECTION  01/08/2023   Procedure: SUBMUCOSAL LIFTING INJECTION;  Surgeon: Brice Campi Albino Alu., MD;  Location: [facility];  Service: Gastroenterology;;   Allergies  Allergen Reactions   Latex Itching and Rash   Metformin  And Related Other (See Comments)    Diarrhea    Prior to Admission medications   Medication Sig Start Date End Date Taking? Authorizing Provider  ACCU-CHEK GUIDE test strip TEST ONCE DAILY 02/04/23  Yes Benjiman Bras, MD  Accu-Chek Softclix Lancets lancets TEST ONCE DAILY 03/14/23  Yes Benjiman Bras, MD  acetaminophen  (TYLENOL ) 325 MG tablet Take 2 tablets (650 mg total) by mouth every 6 (six) hours as needed for up to 30 doses for moderate pain or mild pain. 10/12/22  Yes Trifan, Janalyn Me, MD  albuterol  (VENTOLIN  HFA) [phone] Base) MCG/ACT inhaler Inhale 2 puffs into the lungs every 6 (six) hours as needed (bronchitis/Asthma). 09/17/21  Yes [provider]  atorvastatin  (LIPITOR) 10 MG tablet Take 1 tablet (10 mg total) by mouth daily. Okay to start once per week then slowly increase dosing as tolerated. 09/26/23  Yes Benjiman Bras, MD  Biotin w/ Vitamins C & E (HAIR SKIN & NAILS GUMMIES PO) Take 2 Pieces of gum by mouth daily.   Yes [provider]  Blood Glucose Monitoring Suppl DEVI 1 each by Does not apply route daily. May substitute to any manufacturer covered by patient's insurance. 11/08/22  Yes Benjiman Bras, MD  cyclobenzaprine  (FLEXERIL ) 10 MG tablet Take 1 tablet (10 mg total) by mouth 2 (two)  times daily as needed for up to 14 doses for muscle spasms. 10/12/22  Yes Trifan, Janalyn Me, MD  ibuprofen  (ADVIL ) 600 MG tablet Take 1 tablet (600 mg total) by mouth every 6 (six) hours as needed for up to 30 doses for headache or mild pain. 01/15/23  Yes Mansouraty, Albino Alu., MD  Multiple Vitamin (MULTIVITAMIN WITH MINERALS) TABS tablet Take 1 tablet by mouth daily.   Yes [provider]  prednisoLONE acetate (PRED FORTE) 1 % ophthalmic suspension Place 1 drop into the left eye daily. 11/09/22  Yes [provider]  sertraline  (ZOLOFT ) 25 MG tablet Take 1 tablet (25 mg  total) by mouth daily. 09/26/23  Yes Benjiman Bras, MD   Social History   Socioeconomic History   Marital status: Single    Spouse name: Not on file   Number of children: Not on file   Years of education: Not on file   Highest education level: Not on file  Occupational History   Not on file  Tobacco Use   Smoking status: Every Day    Current packs/day: 0.50    Types: Cigarettes   Smokeless tobacco: Never  Vaping Use   Vaping status: Never Used  Substance and Sexual Activity   Alcohol use: Not Currently    Alcohol/week: 5.0 standard drinks of alcohol    Types: 5 Cans of beer per week   Drug use: No   Sexual activity: Not Currently    Comment: 1st intercourse 58 yo-Fewer than 5 partners  Other Topics Concern   Not on file  Social History Narrative   Not on file   Social Drivers of Health   Financial Resource Strain: Not on file  Food Insecurity: No Food Insecurity (09/18/2022)   Hunger Vital Sign    Worried About Running Out of Food in the Last Year: Never true    Ran Out of Food in the Last Year: Never true  Transportation Needs: No Transportation Needs (09/18/2022)   PRAPARE - Administrator, Civil Service (Medical): No    Lack of Transportation (Non-Medical): No  Physical Activity: Not on file  Stress: Not on file  Social Connections: Not on file  Intimate Partner  Violence: Not on file    Review of Systems  Constitutional:  Negative for fatigue and unexpected weight change.  Respiratory:  Negative for chest tightness and shortness of breath.   Cardiovascular:  Negative for chest pain, palpitations and leg swelling.  Gastrointestinal:  Negative for abdominal pain and blood in stool.  Neurological:  Negative for dizziness, syncope, light-headedness and headaches.     Objective:   Vitals:   12/26/23 1036  BP: 108/64  Pulse: 79  Resp: 14  Temp: 97.8 F (36.6 C)  TempSrc: Temporal  SpO2: 98%  Weight: 161 lb 9.6 oz (73.3 kg)  Height: 5\' 6"  (1.676 m)     Physical Exam Vitals reviewed.  Constitutional:      Appearance: Normal appearance. She is well-developed.  HENT:     Head: Normocephalic and atraumatic.  Eyes:     Conjunctiva/sclera: Conjunctivae normal.     Pupils: Pupils are equal, round, and reactive to light.  Neck:     Vascular: No carotid bruit.  Cardiovascular:     Rate and Rhythm: Normal rate and regular rhythm.     Heart sounds: Normal heart sounds.  Pulmonary:     Effort: Pulmonary effort is normal.     Breath sounds: Normal breath sounds.  Abdominal:     Palpations: Abdomen is soft. There is no pulsatile mass.     Tenderness: There is no abdominal tenderness.  Musculoskeletal:     Right lower leg: No edema.     Left lower leg: No edema.  Skin:    General: Skin is warm and dry.  Neurological:     Mental Status: She is alert and oriented to person, place, and time.  Psychiatric:        Mood and Affect: Mood normal.        Behavior: Behavior normal.        Assessment & Plan:  Brandi Cooke  is a 58 y.o. female . Type 2 diabetes mellitus with hyperglycemia, without long-term current use of insulin (HCC) - Plan: Comprehensive metabolic panel with GFR, Hemoglobin A1c  Hyperlipidemia associated with type 2 diabetes mellitus (HCC) - Plan: atorvastatin  (LIPITOR) 10 MG tablet, Comprehensive metabolic panel  with GFR, Lipid panel  Anxiety with depression  Asthma, unspecified asthma severity, unspecified whether complicated, unspecified whether persistent - Plan: albuterol  (VENTOLIN  HFA) 108 (90 Base) MCG/ACT inhaler  Cigarette nicotine dependence without complication   Meds ordered this encounter  Medications   albuterol  (VENTOLIN  HFA) 108 (90 Base) MCG/ACT inhaler    Sig: Inhale 2 puffs into the lungs every 6 (six) hours as needed (bronchitis/Asthma).    Dispense:  18 g    Refill:  0   atorvastatin  (LIPITOR) 10 MG tablet    Sig: Take 1 tablet (10 mg total) by mouth daily.    Dispense:  90 tablet    Refill:  0   Patient Instructions  Depending on cholesterol levels, may need to take lipitor more often. I will let you know.   Anxiety score today is exactly the same as last time.  I do think there is room for improvement in treatment of your anxiety symptoms and again I would recommend taking the sertraline  every single day, not skipping any doses.  If you take it every day for a few weeks and do not notice any significant side effects or improvement of symptoms, then we can increase the dose to 50 mg.  Let me know.  I do recommend using a pill minder over-the-counter to help you remember medications.  I will check the diabetes test and other labs today and let you know if any concerns.  It is very important that you quit smoking, please let me know if we can help.  I did refill albuterol  temporarily but if you have to use that more than once a month I do recommend some further testing or evaluation as symptoms could be related to COPD, not just asthma with history of smoking.  Let me know.   Take care!      Signed,   Caro Christmas, MD [facility] [place] [facility] 12/26/23 11:24 AM

## 2023-12-26 NOTE — Patient Instructions (Addendum)
 Depending on cholesterol levels, may need to take lipitor more often. I will let you know.   Anxiety score today is exactly the same as last time.  I do think there is room for improvement in treatment of your anxiety symptoms and again I would recommend taking the sertraline  every single day, not skipping any doses.  If you take it every day for a few weeks and do not notice any significant side effects or improvement of symptoms, then we can increase the dose to 50 mg.  Let me know.  I do recommend using a pill minder over-the-counter to help you remember medications.  I will check the diabetes test and other labs today and let you know if any concerns.  It is very important that you quit smoking, please let me know if we can help.  I did refill albuterol  temporarily but if you have to use that more than once a month I do recommend some further testing or evaluation as symptoms could be related to COPD, not just asthma with history of smoking.  Let me know.   Take care!

## 2023-12-31 ENCOUNTER — Ambulatory Visit: Payer: Self-pay | Admitting: Family Medicine

## 2024-01-07 NOTE — Progress Notes (Signed)
 GYNECOLOGY  VISIT   HPI: 58 y.o.   Single  African American female   G1P1001 with Patient's last menstrual period was 10/06/2014.   here for: sonohysterogram with endometrial biopsy   Denies vaginal bleeding.   Evaluation started with a CT of abdomen and pelvis on 12/21/22 for her neuroendocrine tumor which showed a 3.3 cm subserosal fibroid and a 1 cm either a submucuous fibroid or uterine polyp.   Follow up office pelvic US  12/10/23: Uterus 7.40 x 6.01 x 5.20 cm. Multiple fibroids:  0.95 cm, 0.66 cm, 1.36 cm, 1.36 cm, 2.97 cm, 0.78 cm.  Largest fibroid is left lateral 3.2 cm.  Submucosal fibroid 1.1 cm. EMS 12.06 mm.  Cystic areas seen with possible feeder vessel.  Also suspected submucous fibroid 1.1 cm.  Left ovary:  2.72 x 1.69 x 1.37 cm.  Normal.  Right ovary:  2.28 x 1.63 x 1.33 cm.  Normal.  No adnexal masses.  No free fluid.   GYNECOLOGIC HISTORY: Patient's last menstrual period was 10/06/2014. Contraception:  PMP Menopausal hormone therapy:  n/a Last 2 paps:  08/02/22 neg, HR HPV neg, 07/06/19 neg  History of abnormal Pap or positive HPV:  no Mammogram:  12/09/23 Breast Density Cat B, BIRADS cat 1 neg         OB History     Gravida  1   Para  1   Term  1   Preterm      AB      Living  1      SAB      IAB      Ectopic      Multiple      Live Births                 Patient Active Problem List   Diagnosis Date Noted   Anxiety    Neuroendocrine tumor 2024   Granular cell tumor 12/07/2013   Fibroid    Bell's palsy    Glaucoma     Past Medical History:  Diagnosis Date   Allergy    Anemia    Anxiety    Bell's palsy    Depression    Fibroid    Glaucoma    Open   Hypertension    Neuroendocrine tumor 2024   colon   Vertigo     Past Surgical History:  Procedure Laterality Date   BREAST EXCISIONAL BIOPSY Left    BREAST LUMPECTOMY WITH RADIOACTIVE SEED LOCALIZATION Left 01/12/2014   Procedure: LEFT BREAST LUMPECTOMY WITH RADIOACTIVE  SEED LOCALIZATION;  Surgeon: Andy Bannister A. Cornett, MD;  Location: [facility];  Service: General;  Laterality: Left;   BREAST SURGERY     Cyst removed   ENDOSCOPIC MUCOSAL RESECTION N/A 01/08/2023   Procedure: ENDOSCOPIC MUCOSAL RESECTION;  Surgeon: Brice Campi Albino Alu., MD;  Location: [facility];  Service: Gastroenterology;  Laterality: N/A;   EUS N/A 01/08/2023   Procedure: LOWER ENDOSCOPIC ULTRASOUND (EUS);  Surgeon: Normie Becton., MD;  Location: [facility];  Service: Gastroenterology;  Laterality: N/A;   FLEXIBLE SIGMOIDOSCOPY N/A 01/08/2023   Procedure: FLEXIBLE SIGMOIDOSCOPY;  Surgeon: Brice Campi Albino Alu., MD;  Location: [facility];  Service: Gastroenterology;  Laterality: N/A;   HEMOSTASIS CLIP PLACEMENT  01/08/2023   Procedure: HEMOSTASIS CLIP PLACEMENT;  Surgeon: Normie Becton., MD;  Location: [facility];  Service: Gastroenterology;;   HYSTEROSCOPY  2002   myomectomy   MYOMECTOMY  10/2005   PELVIC LAPAROSCOPY     Expl.  Lap.   REFRACTIVE SURGERY  07/26/2022   SUBMUCOSAL LIFTING INJECTION  01/08/2023   Procedure: SUBMUCOSAL LIFTING INJECTION;  Surgeon: Normie Becton., MD;  Location: [facility];  Service: Gastroenterology;;    Current Outpatient Medications  Medication Sig Dispense Refill   ACCU-CHEK GUIDE test strip TEST ONCE DAILY 100 strip 0   Accu-Chek Softclix Lancets lancets TEST ONCE DAILY 100 each 1   acetaminophen  (TYLENOL ) 325 MG tablet Take 2 tablets (650 mg total) by mouth every 6 (six) hours as needed for up to 30 doses for moderate pain or mild pain. 30 tablet 0   albuterol  (VENTOLIN  HFA) 108 (90 Base) MCG/ACT inhaler Inhale 2 puffs into the lungs every 6 (six) hours as needed (bronchitis/Asthma). 18 g 0   atorvastatin  (LIPITOR) 10 MG tablet Take 1 tablet (10 mg total) by mouth daily. 90 tablet 0   Biotin w/ Vitamins C & E (HAIR SKIN & NAILS GUMMIES PO) Take 2 Pieces of gum by mouth daily.     Blood Glucose Monitoring  Suppl DEVI 1 each by Does not apply route daily. May substitute to any manufacturer covered by patient's insurance. 1 each 0   cyclobenzaprine  (FLEXERIL ) 10 MG tablet Take 1 tablet (10 mg total) by mouth 2 (two) times daily as needed for up to 14 doses for muscle spasms. 14 tablet 0   ibuprofen  (ADVIL ) 600 MG tablet Take 1 tablet (600 mg total) by mouth every 6 (six) hours as needed for up to 30 doses for headache or mild pain. 30 tablet 0   Multiple Vitamin (MULTIVITAMIN WITH MINERALS) TABS tablet Take 1 tablet by mouth daily.     prednisoLONE acetate (PRED FORTE) 1 % ophthalmic suspension Place 1 drop into the left eye daily.     sertraline  (ZOLOFT ) 25 MG tablet Take 1 tablet (25 mg total) by mouth daily. 90 tablet 1   No current facility-administered medications for this visit.     ALLERGIES: Latex and Metformin  and related  Family History  Problem Relation Age of Onset   Hypertension Mother    Diabetes Mother    Stroke Brother    Migraines Brother    Migraines Brother    Heart disease Brother    Thyroid disease Brother    Cancer Maternal Uncle        Unknown type   Colon polyps Neg Hx    Colon cancer Neg Hx    Esophageal cancer Neg Hx    Stomach cancer Neg Hx    Rectal cancer Neg Hx     Social History   Socioeconomic History   Marital status: Single    Spouse name: Not on file   Number of children: Not on file   Years of education: Not on file   Highest education level: Not on file  Occupational History   Not on file  Tobacco Use   Smoking status: Every Day    Current packs/day: 0.50    Types: Cigarettes   Smokeless tobacco: Never  Vaping Use   Vaping status: Never Used  Substance and Sexual Activity   Alcohol use: Not Currently    Alcohol/week: 5.0 standard drinks of alcohol    Types: 5 Cans of beer per week   Drug use: No   Sexual activity: Not Currently    Comment: 1st intercourse 58 yo-Fewer than 5 partners  Other Topics Concern   Not on file  Social  History Narrative   Not on file   Social Drivers of  Health   Financial Resource Strain: Not on file  Food Insecurity: No Food Insecurity (09/18/2022)   Hunger Vital Sign    Worried About Running Out of Food in the Last Year: Never true    Ran Out of Food in the Last Year: Never true  Transportation Needs: No Transportation Needs (09/18/2022)   PRAPARE - Administrator, Civil Service (Medical): No    Lack of Transportation (Non-Medical): No  Physical Activity: Not on file  Stress: Not on file  Social Connections: Not on file  Intimate Partner Violence: Not on file    Review of Systems  All other systems reviewed and are negative.   PHYSICAL EXAMINATION:   BP 124/82 (BP Location: Left Arm, Patient Position: Sitting)   Pulse 73   LMP 10/06/2014   SpO2 96%     General appearance: alert, cooperative and appears stated age   Procedures:  sonohysterogram and endometrial biopsy.  Consent and time out done.   Pelvic US : Uterus 7.85 x 5.39 x 4.55 cm.  Retroverted.  Multiple fibroids:  2.65 cm, 1.01 cm, 1.41 cm, 1.09 cm, 1.32 cm, 0.69 cm, 1.06 cm, 1.08 cm.  EMS 8.96 mm.  Submucosal fibroid, cystic endometrium.  Left ovary 2.60 x 1.34 x 1.28 cm.  Atrophic.  Right ovary 2.22 x 1.57 x 1.58 cm.  Atrophic.  No adnexal masses.  No free fluid.   Sonohysterogram:  Consent and time out done.  Sterile prep with betadine. Tenaculum placed to the posterior and then anterior cervical lip.  Sterile saline fluid injected inside uterine cavity.   2 filling defects noted:  1 cm submucosal fibroid, 15 x 3 mm intracavitary mass.   Attempted endometrial biopsy:  Consent and time out done.  Sterile prep with betadine.  Tenaculum to anterior cervical lip.  Pipelle will not pass.  Os finder used and will not pass.  Patient not able to tolerate the procedure also. Procedure aborted.   No complications to procedures.  Minimal EBL.   Chaperone was present for exam:  Cottie Diss,  CMA  ASSESSMENT:  Fibroids, including submucous fibroid  Endometrial mass.  Cervical stenosis.   PLAN:  Sonohysterogram findings reviewed.  Inability to do the endometrial biopsy also explained.  We discussed options of repeat endometrial biopsy on another day with paracervical block and cervical dilation versus referral to surgical GYN colleague for hysteroscopy, Myosure resection of submucous fibroid and endometrial mass with dilation and curettage.   Patient prefers surgical treatment which will biopsy and removed the intrauterine pathology.  Written information given.   32 min  total time was spent for this patient encounter, including preparation, face-to-face counseling with the patient, coordination of care, and documentation of the encounter in addition to doing the sonohysterogram and attempted endometrial biopsy.

## 2024-01-08 ENCOUNTER — Other Ambulatory Visit: Payer: Self-pay | Admitting: Obstetrics and Gynecology

## 2024-01-08 ENCOUNTER — Ambulatory Visit

## 2024-01-08 ENCOUNTER — Ambulatory Visit (INDEPENDENT_AMBULATORY_CARE_PROVIDER_SITE_OTHER): Admitting: Obstetrics and Gynecology

## 2024-01-08 ENCOUNTER — Telehealth: Payer: Self-pay | Admitting: Gastroenterology

## 2024-01-08 ENCOUNTER — Encounter: Payer: Self-pay | Admitting: Obstetrics and Gynecology

## 2024-01-08 ENCOUNTER — Encounter (HOSPITAL_COMMUNITY): Payer: Self-pay | Admitting: Gastroenterology

## 2024-01-08 VITALS — BP 124/82 | HR 73

## 2024-01-08 DIAGNOSIS — R9389 Abnormal findings on diagnostic imaging of other specified body structures: Secondary | ICD-10-CM

## 2024-01-08 DIAGNOSIS — D250 Submucous leiomyoma of uterus: Secondary | ICD-10-CM

## 2024-01-08 DIAGNOSIS — N9489 Other specified conditions associated with female genital organs and menstrual cycle: Secondary | ICD-10-CM | POA: Diagnosis not present

## 2024-01-08 NOTE — Telephone Encounter (Addendum)
 Procedure:EUS Procedure date: 01/16/24 Procedure location: [facility] Arrival Time: 6:30 am Spoke with the patient Y/N:   No, called [phone] 01/08/24 @ 9:34 am, voicemail have not been set up yet, unable to leave a message, my chart message sent No, called [phone] 01/09/24 @ 4:12 pm, voicemail have not been set up yet, unable to leave a message  Any prep concerns? ___  Has the patient obtained the prep from the pharmacy ? No prep needed Do you have a care partner and transportation: ___ Any additional concerns? ___

## 2024-01-08 NOTE — Progress Notes (Signed)
 Attempted to obtain medical history via telephone, unable to reach at this time. Unable to leave voicemail to return pre surgical testing department's phone call,due to mailbox not set up.

## 2024-01-08 NOTE — Patient Instructions (Signed)
 Removing Tissue From the Uterus (Dilation and Curettage): What to Expect Dilation and curettage, also called a D&C, is a procedure where tissue is removed from the uterus. It involves opening the cervix (dilation) and then gently scraping the inside lining of the uterus. The tissue is then removed through the cervix and vagina. In some cases, the lining and tissue in the uterus are removed using gentle suction. This is called vacuum curettage. Curettage may be performed to either diagnose or treat a problem. It may be done if you have: Spotting between periods, long periods, or other abnormal bleeding. Bleeding after menopause. No period. A change in the size and shape of the uterus. This procedure may also be done: To remove an intrauterine device (IUD). To remove the remaining placenta after giving birth. To remove growths or tumors in the lining of the uterus. During an abortion or after a miscarriage. Tell a health care provider about: Any allergies you have. All medicines you take. These include vitamins, herbs, eye drops, and creams. Any surgeries you've had. Any medical problems you have. Any bleeding problems you have. Any problems you or family members have had with anesthesia. Whether you're pregnant or may be pregnant. Any recent vaginal infections, periods, or vaginal bleeding. What form of birth control you use. What are the risks? Your health care provider will talk with you about risks. These may include: Infection. Heavy vaginal bleeding. Allergic reactions to medicines. Damage to the cervix or nearby structures or organs. Scar tissue forming inside the uterus. What happens before? When to stop eating and drinking Eat and drink only as you've been told. You may be told this: 8 hours before your procedure Stop eating most foods. Do not eat meat, fried foods, or fatty foods. Eat only light foods, such as toast or crackers. All liquids are OK except energy drinks and  alcohol. 6 hours before your procedure Stop eating. Drink only clear liquids, such as water, clear fruit juice, black coffee, plain tea, and sports drinks. Do not drink energy drinks or alcohol. 2 hours before your procedure Stop drinking all liquids. You may be allowed to take medicines with small sips of water. If you don't eat and drink as told, your procedure may be delayed or canceled. Medicines Ask about changing or stopping: Any medicines you take. Any vitamins, herbs, or supplements you take. Do not take aspirin or ibuprofen  unless you're told to. You may be given medicine to open the cervix. You may be given antibiotics. Tests You may have testing, such as: A pregnancy test. Blood tests. Pee tests. Ultrasound. General instructions For the time you're told before your procedure: Do not douche, use tampons, or have sex. Do not use medicines, creams, or suppositories in the vagina. Do not smoke, vape, or use nicotine or tobacco for at least 4 weeks before the procedure. Ask if you'll be staying overnight in the hospital. If you'll be going home right after the procedure, plan to have a responsible adult: Drive you home from the hospital or clinic. You won't be allowed to drive. Stay with you for the time you're told. What happens during the dilation and curettage?  An IV will be put into a vein in your hand or arm. You may be given: A sedative to help you relax. Anesthesia to keep you from feeling pain. You'll lie down on your back, with your knees bent and your feet in footrests. A special tool covered with gel, called a speculum, will be inserted  into your vagina. This will let your provider see your cervix. Thin rods will be put into your cervix to open it. A small, curved tool will be put through your cervix. Your provider will use this to remove tissue or cells. In some cases, gentle suction will also be used. The cells will be sent to a lab for testing. These  steps may vary. Ask what you can expect. What happens after? You'll be watched closely until you leave. This includes checking your pain level, blood pressure, heart rate, and breathing rate. You may have mild cramping, a backache, pain, and light bleeding or spotting. You may pass small blood clots from your vagina. You may have to wear compression stockings. These stockings help to prevent blood clots and reduce swelling in your legs. This information is not intended to replace advice given to you by your health care provider. Make sure you discuss any questions you have with your health care provider. Document Revised: 05/03/2023 Document Reviewed: 05/03/2023 Elsevier Patient Education  2025 [employer]Hysteroscopy Hysteroscopy is a procedure used to look inside a person's uterus. It should be done right after your period. You may have this procedure if your health care provider wants to: Look for tumors and other growths in the uterus. Know more about your bleeding, fibroids, tumors, polyps, scar tissue, or cancer in the uterus. Know why you aren't able to get pregnant, or why you lose your pregnancies. Find an intrauterine device (IUD) in your body. Put a birth control device in the fallopian tubes. Tell a health care provider about: Any allergies you have. All medicines you are taking. These include vitamins, herbs, eye drops, creams, and over-the-counter medicines. Any problems you or family members have had with anesthesia. Any bleeding problems you have. Any medical conditions or surgeries you've had. Whether you're pregnant or may be pregnant. Whether you have or have had a sexually transmitted infection (STI), or you think you have an STI. What are the risks? Your provider will talk with you about risks. These may include: Bleeding that's worse. Infection. Damage to parts of the uterus or other organs. Allergies to medicines or fluids that are used in the procedure. What  happens before the procedure? When to stop eating and drinking Follow instructions from your provider about what you may eat and drink. These may include: 8 hours before your procedure Stop eating most foods. Do not eat meat, fried foods, or fatty foods. Eat only light foods, such as toast or crackers. All liquids are okay except energy drinks and alcohol. 6 hours before your procedure Stop eating. Drink only clear liquids, such as water, clear fruit juice, black coffee, plain tea, and sports drinks. Do not drink energy drinks or alcohol. 2 hours before your procedure Stop drinking all liquids. You may be allowed to take medicines with small sips of water. If you do not follow your provider's instructions, your procedure may be delayed or canceled. Medicines Ask your provider about: Changing or stopping your regular medicines. These include any diabetes medicines or blood thinners you take. Taking medicines such as aspirin and ibuprofen . These medicines can thin your blood. Do not take them unless your provider tells you to. Taking over-the-counter medicines, vitamins, herbs, and supplements. Medicine may be placed in your cervix the day before the procedure. This medicine causes the cervix to open (dilate). The larger opening makes it easier for the hysteroscope to be inserted into the uterus. General instructions Ask your provider what steps will be  taken to help prevent infection. These steps may include: Washing skin with a soap that kills germs. Taking antibiotic medicine. Do not smoke, vape, or use any products that have nicotine or tobacco for at least 4 weeks before the surgery. If you need help quitting, ask your provider. If you will be going home right after the procedure, plan to have an adult: Take you home from the hospital or clinic. You will not be allowed to drive. Care for you for the time you are told. Pee before the procedure begins. What happens during the  procedure? You may be given: A sedative. This helps you relax. Anesthesia. This keeps you from feeling pain. It will make you fall asleep for surgery. A small tube with a light and camera called a hysteroscope will be put into your uterus. The camera sends images to a screen in the room so your provider can see the inside of your uterus. Air or fluid will be used to enlarge your uterus to allow your provider to see it better. In some cases, tissue may be gently scraped from inside the uterus and sent to a lab for testing (biopsy). The procedure may vary among providers and hospitals. What happens after the procedure? Your blood pressure, heart rate, breathing rate, and blood oxygen level will be monitored until you leave the hospital or clinic. You may have cramps. You may be given medicines for this. You may have bleeding. Bleeding may be light or heavy. This is normal. If you had a biopsy, it is up to you to get the results. Ask your provider, or the department that is doing the procedure, when your results will be ready. This information is not intended to replace advice given to you by your health care provider. Make sure you discuss any questions you have with your health care provider. Document Revised: 11/16/2022 Document Reviewed: 11/16/2022 Elsevier Patient Education  2024 [employer].

## 2024-01-09 ENCOUNTER — Telehealth: Payer: Self-pay | Admitting: Gastroenterology

## 2024-01-09 NOTE — Telephone Encounter (Signed)
 error

## 2024-01-11 NOTE — Telephone Encounter (Signed)
 Once per day is good. She was only taking 2 times per week at last visit. Stay at once per day for now.

## 2024-01-15 NOTE — Anesthesia Preprocedure Evaluation (Signed)
 Anesthesia Evaluation  Patient identified by MRN, date of birth, ID band Patient awake    Reviewed: Allergy & Precautions, NPO status , Patient's Chart, lab work & pertinent test results  Airway Mallampati: II  TM Distance: >3 FB Neck ROM: Full    Dental  (+) Dental Advisory Given, Missing   Pulmonary Current SmokerPatient did not abstain from smoking.   Pulmonary exam normal breath sounds clear to auscultation       Cardiovascular hypertension, Normal cardiovascular exam Rhythm:Regular Rate:Normal     Neuro/Psych  PSYCHIATRIC DISORDERS Anxiety Depression     Neuromuscular disease    GI/Hepatic Neg liver ROS,,,Rectal cancer Neuroendocrine tumor, colon    Endo/Other  diabetes, Type 2, Oral Hypoglycemic Agents    Renal/GU negative Renal ROS     Musculoskeletal negative musculoskeletal ROS (+)    Abdominal   Peds  Hematology negative hematology ROS (+)   Anesthesia Other Findings   Reproductive/Obstetrics                             Anesthesia Physical Anesthesia Plan  ASA: 3  Anesthesia Plan: MAC   Post-op Pain Management: Minimal or no pain anticipated   Induction: Intravenous  PONV Risk Score and Plan: 1 and TIVA  Airway Management Planned: Natural Airway and Simple Face Mask  Additional Equipment:   Intra-op Plan:   Post-operative Plan:   Informed Consent: I have reviewed the patients History and Physical, chart, labs and discussed the procedure including the risks, benefits and alternatives for the proposed anesthesia with the patient or authorized representative who has indicated his/her understanding and acceptance.     Dental advisory given  Plan Discussed with: CRNA  Anesthesia Plan Comments:        Anesthesia Quick Evaluation

## 2024-01-16 ENCOUNTER — Ambulatory Visit (HOSPITAL_BASED_OUTPATIENT_CLINIC_OR_DEPARTMENT_OTHER): Payer: Self-pay | Admitting: Anesthesiology

## 2024-01-16 ENCOUNTER — Other Ambulatory Visit: Payer: Self-pay

## 2024-01-16 ENCOUNTER — Ambulatory Visit (HOSPITAL_COMMUNITY): Payer: Self-pay | Admitting: Anesthesiology

## 2024-01-16 ENCOUNTER — Encounter (HOSPITAL_COMMUNITY)
Admission: RE | Disposition: A | Discharge status: Home / Self Care | Payer: Self-pay | Source: Home / Self Care | Attending: Gastroenterology

## 2024-01-16 ENCOUNTER — Ambulatory Visit (HOSPITAL_COMMUNITY)
Admission: RE | Admit: 2024-01-16 | Discharge: 2024-01-16 | Disposition: A | Discharge status: Home / Self Care | Attending: Gastroenterology | Admitting: Gastroenterology

## 2024-01-16 ENCOUNTER — Encounter (HOSPITAL_COMMUNITY): Payer: Self-pay | Admitting: Gastroenterology

## 2024-01-16 DIAGNOSIS — D490 Neoplasm of unspecified behavior of digestive system: Secondary | ICD-10-CM | POA: Diagnosis present

## 2024-01-16 DIAGNOSIS — F17210 Nicotine dependence, cigarettes, uncomplicated: Secondary | ICD-10-CM | POA: Insufficient documentation

## 2024-01-16 DIAGNOSIS — G709 Myoneural disorder, unspecified: Secondary | ICD-10-CM | POA: Insufficient documentation

## 2024-01-16 DIAGNOSIS — K5730 Diverticulosis of large intestine without perforation or abscess without bleeding: Secondary | ICD-10-CM

## 2024-01-16 DIAGNOSIS — I10 Essential (primary) hypertension: Secondary | ICD-10-CM | POA: Insufficient documentation

## 2024-01-16 DIAGNOSIS — D128 Benign neoplasm of rectum: Secondary | ICD-10-CM | POA: Diagnosis not present

## 2024-01-16 DIAGNOSIS — K621 Rectal polyp: Secondary | ICD-10-CM

## 2024-01-16 DIAGNOSIS — Z8249 Family history of ischemic heart disease and other diseases of the circulatory system: Secondary | ICD-10-CM | POA: Insufficient documentation

## 2024-01-16 DIAGNOSIS — I899 Noninfective disorder of lymphatic vessels and lymph nodes, unspecified: Secondary | ICD-10-CM

## 2024-01-16 DIAGNOSIS — Z8719 Personal history of other diseases of the digestive system: Secondary | ICD-10-CM

## 2024-01-16 DIAGNOSIS — Q439 Congenital malformation of intestine, unspecified: Secondary | ICD-10-CM

## 2024-01-16 DIAGNOSIS — Z9889 Other specified postprocedural states: Secondary | ICD-10-CM | POA: Insufficient documentation

## 2024-01-16 DIAGNOSIS — Z833 Family history of diabetes mellitus: Secondary | ICD-10-CM | POA: Diagnosis not present

## 2024-01-16 DIAGNOSIS — K641 Second degree hemorrhoids: Secondary | ICD-10-CM | POA: Diagnosis not present

## 2024-01-16 DIAGNOSIS — E119 Type 2 diabetes mellitus without complications: Secondary | ICD-10-CM | POA: Diagnosis not present

## 2024-01-16 DIAGNOSIS — K6289 Other specified diseases of anus and rectum: Secondary | ICD-10-CM

## 2024-01-16 DIAGNOSIS — Z08 Encounter for follow-up examination after completed treatment for malignant neoplasm: Secondary | ICD-10-CM

## 2024-01-16 DIAGNOSIS — R933 Abnormal findings on diagnostic imaging of other parts of digestive tract: Secondary | ICD-10-CM | POA: Diagnosis not present

## 2024-01-16 DIAGNOSIS — D3A8 Other benign neuroendocrine tumors: Secondary | ICD-10-CM

## 2024-01-16 DIAGNOSIS — Z85048 Personal history of other malignant neoplasm of rectum, rectosigmoid junction, and anus: Secondary | ICD-10-CM

## 2024-01-16 HISTORY — PX: POLYPECTOMY: SHX149

## 2024-01-16 HISTORY — PX: EUS: SHX5427

## 2024-01-16 HISTORY — PX: FLEXIBLE SIGMOIDOSCOPY: SHX5431

## 2024-01-16 HISTORY — DX: Type 2 diabetes mellitus without complications: E11.9

## 2024-01-16 LAB — GLUCOSE, CAPILLARY: Glucose-Capillary: 130 mg/dL — ABNORMAL HIGH (ref 70–99)

## 2024-01-16 SURGERY — ULTRASOUND, LOWER GI TRACT, ENDOSCOPIC
Anesthesia: Monitor Anesthesia Care

## 2024-01-16 MED ORDER — PROPOFOL 500 MG/50ML IV EMUL
INTRAVENOUS | Status: AC
  Filled 2024-01-16: qty 50

## 2024-01-16 MED ORDER — PROPOFOL 1000 MG/100ML IV EMUL
INTRAVENOUS | Status: AC
  Filled 2024-01-16: qty 100

## 2024-01-16 MED ORDER — SODIUM CHLORIDE 0.9 % IV SOLN
INTRAVENOUS | Status: AC | PRN
  Administered 2024-01-16: 500 mL via INTRAVENOUS

## 2024-01-16 MED ORDER — PROPOFOL 500 MG/50ML IV EMUL
INTRAVENOUS | Status: DC | PRN
  Administered 2024-01-16: 150 ug/kg/min via INTRAVENOUS

## 2024-01-16 MED ORDER — SODIUM CHLORIDE 0.9 % IV SOLN: INTRAVENOUS | Status: DC

## 2024-01-16 MED ORDER — PROPOFOL 10 MG/ML IV BOLUS
INTRAVENOUS | Status: DC | PRN
  Administered 2024-01-16: 20 mg via INTRAVENOUS
  Administered 2024-01-16: 10 mg via INTRAVENOUS
  Administered 2024-01-16: 20 mg via INTRAVENOUS

## 2024-01-16 NOTE — Discharge Instructions (Signed)

## 2024-01-16 NOTE — Op Note (Signed)
 [facility] Patient Name: Brandi Cooke Procedure Date: 01/16/2024 MRN: [id number] Attending MD: Yong Henle , MD, [id number] Date of Birth: 1965-12-16 CSN: [id number] Age: 58 Admit Type: Outpatient Procedure:                Lower EUS Indications:              Post-op endosonographic surveillance for rectal                            neuroendocrine tumor recurrence, Rectal deformity                            found on endoscopy; subepithelial tumor versus                            extrinsic compression, Neuroendocrine Tumor Providers:                Yong Henle, MD, Bradley Caffey, Marvelyn Slim, Technician Referring MD:              Medicines:                Monitored Anesthesia Care Complications:            No immediate complications. Estimated Blood Loss:     Estimated blood loss was minimal. Procedure:                Pre-Anesthesia Assessment:                           - Prior to the procedure, a History and Physical                            was performed, and patient medications and                            allergies were reviewed. The patient's tolerance of                            previous anesthesia was also reviewed. The risks                            and benefits of the procedure and the sedation                            options and risks were discussed with the patient.                            All questions were answered, and informed consent                            was obtained. Prior Anticoagulants: The patient has                            taken no anticoagulant or antiplatelet  agents                            except for NSAID medication. ASA Grade Assessment:                            II - A patient with mild systemic disease. After                            reviewing the risks and benefits, the patient was                            deemed in satisfactory condition to undergo the                             procedure.                           After obtaining informed consent, the endoscope was                            passed under direct vision. Throughout the                            procedure, the patient's blood pressure, pulse, and                            oxygen saturations were monitored continuously. The                            [id number]-[id number] ([id number]) Olympus endoscope was introduced                            through the anus and advanced to the the left                            transverse colon. The GF-[id number]-[id number] ([id number])                            Olympus radial ultrasound scope was introduced                            through the anus and advanced to the the sigmoid                            colon for ultrasound. The lower EUS was                            accomplished without difficulty. The patient                            tolerated the procedure. The quality of the bowel  preparation was adequate. Scope In: 8:08:44 AM Scope Out: 8:33:05 AM Total Procedure Duration: 0 hours 24 minutes 21 seconds  Findings:      The digital rectal exam findings include hemorrhoids. Pertinent       negatives include no palpable rectal lesions.      ENDOSCOPIC FINDING: :      A moderate amount of semi-liquid semi-solid stool was found in the       entire colon, interfering with visualization. Lavage of the area was       performed using copious amounts, resulting in clearance with adequate       visualization.      The left colon was mildly tortuous.      Multiple small-mouthed diverticula were found in the recto-sigmoid colon       and sigmoid colon.      A medium post mucosectomy scar was found in the mid rectum. The scar       tissue was healthy in appearance.      Three sessile polyps were found in the rectum. The polyps were 2 to 3 mm       in size. The polyps were removed with a cold snare. Resection and       retrieval were  complete.      Non-bleeding non-thrombosed internal hemorrhoids were found during       retroflexion, during perianal exam and during digital exam. The       hemorrhoids were Grade II (internal hemorrhoids that prolapse but reduce       spontaneously).      ENDOSONOGRAPHIC FINDING: :      The rectum was normal even at area of scar site.      The perirectal space was normal.      The rectosigmoid junction was normal.      No malignant-appearing lymph nodes were visualized in the perirectal       region and in the left iliac region. The nodes were.      The internal anal sphincter was visualized endosonographically and       appeared normal. Impression:               FLEX Impression:                           - Hemorrhoids found on digital rectal exam.                           - Stool in the entire examined colon. Lavaged with                            adequate visualization.                           - Tortuous left colon.                           - Diverticulosis in the recto-sigmoid colon and in                            the sigmoid colon.                           - Post  mucosectomy scar in the mid rectum.                           - Three 2 to 3 mm polyps in the rectum, removed                            with a cold snare. Resected and retrieved.                           - Non-bleeding non-thrombosed internal hemorrhoids.                           EUS Impression:                           - Endosonographic images of the rectum were                            unremarkable.                           - Endosonographic images of the perirectal space                            were unremarkable.                           - Endosonographic imaging showed no sign of                            significant pathology at the rectosigmoid junction.                           - No malignant-appearing lymph nodes were                            visualized endosonographically in the  perirectal                            region and in the left iliac region.                           - The internal anal sphincter was visualized                            endosonographically and appeared normal. Moderate Sedation:      Not Applicable - Patient had care per Anesthesia. Recommendation:           - The patient will be observed post-procedure,                            until all discharge criteria are met.                           - Discharge patient to home.                           -  Patient has a contact number available for                            emergencies. The signs and symptoms of potential                            delayed complications were discussed with the                            patient. Return to normal activities tomorrow.                            Written discharge instructions were provided to the                            patient.                           - Resume previous diet.                           - Await path results.                           - Repeat lower endoscopic ultrasound in 2 years for                            surveillance of previously resected neuroendocrine                            tumor of the rectum.                           - The findings and recommendations were discussed                            with the patient.                           - The findings and recommendations were discussed                            with the designated responsible adult. Procedure Code(s):        --- Professional ---                           [phone], Sigmoidoscopy, flexible; with endoscopic                            ultrasound examination                           [phone], Sigmoidoscopy, flexible; with removal of                            tumor(s), polyp(s), or other lesion(s) by snare  technique Diagnosis Code(s):        --- Professional ---                           K64.1, Second degree  hemorrhoids                           Z98.890, Other specified postprocedural states                           D12.8, Benign neoplasm of rectum                           Z08, Encounter for follow-up examination after                            completed treatment for malignant neoplasm                           Z85.048, Personal history of other malignant                            neoplasm of rectum, rectosigmoid junction, and anus                           K62.89, Other specified diseases of anus and rectum                           K57.30, Diverticulosis of large intestine without                            perforation or abscess without bleeding                           Q43.8, Other specified congenital malformations of                            intestine CPT copyright 2022 American [employer] Association. All rights reserved. The codes documented in this report are preliminary and upon coder review may  be revised to meet current compliance requirements. Yong Henle, MD 01/16/2024 8:43:42 AM Number of Addenda: 0

## 2024-01-16 NOTE — Transfer of Care (Signed)
 Immediate Anesthesia Transfer of Care Note  Patient: Brandi Cooke  Procedure(s) Performed: ULTRASOUND, LOWER GI TRACT, ENDOSCOPIC SIGMOIDOSCOPY, FLEXIBLE POLYPECTOMY, INTESTINE  Patient Location: PACU  Anesthesia Type:MAC  Level of Consciousness: sedated  Airway & Oxygen Therapy: Patient Spontanous Breathing and Patient connected to face mask oxygen  Post-op Assessment: Report given to RN and Post -op Vital signs reviewed and stable  Post vital signs: Reviewed and stable  Last Vitals:  Vitals Value Taken Time  BP    Temp    Pulse    Resp    SpO2      Last Pain:  Vitals:   01/16/24 0733  TempSrc: Temporal  PainSc: 0-No pain         Complications: No notable events documented.

## 2024-01-16 NOTE — Anesthesia Postprocedure Evaluation (Signed)
 Anesthesia Post Note  Patient: CINZIA DEVOS  Procedure(s) Performed: ULTRASOUND, LOWER GI TRACT, ENDOSCOPIC SIGMOIDOSCOPY, FLEXIBLE POLYPECTOMY, INTESTINE     Patient location during evaluation: Endoscopy Anesthesia Type: MAC Level of consciousness: oriented, awake and alert and awake Pain management: pain level controlled Vital Signs Assessment: post-procedure vital signs reviewed and stable Respiratory status: spontaneous breathing, nonlabored ventilation, respiratory function stable and patient connected to nasal cannula oxygen Cardiovascular status: blood pressure returned to baseline and stable Postop Assessment: no headache, no backache and no apparent nausea or vomiting Anesthetic complications: no   No notable events documented.  Last Vitals:  Vitals:   01/16/24 0850 01/16/24 0900  BP: 105/81 (!) 134/92  Pulse: 64 (!) 55  Resp: 18 18  Temp:    SpO2: 99% 100%    Last Pain:  Vitals:   01/16/24 0900  TempSrc:   PainSc: 0-No pain                 Erin Havers

## 2024-01-16 NOTE — H&P (Signed)
 GASTROENTEROLOGY PROCEDURE H&P NOTE   Primary Care Physician: Benjiman Bras, MD  HPI: Brandi Cooke is a 58 y.o. 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  Past Medical History:  Diagnosis Date   Allergy    Anemia    Anxiety    Bell's palsy    Depression    Diabetes mellitus without complication (HCC)    Fibroid    Glaucoma    Open   Hypertension    Neuroendocrine tumor 2024   colon   Vertigo    Past Surgical History:  Procedure Laterality Date   BREAST EXCISIONAL BIOPSY Left    BREAST LUMPECTOMY WITH RADIOACTIVE SEED LOCALIZATION Left 01/12/2014   Procedure: LEFT BREAST LUMPECTOMY WITH RADIOACTIVE SEED LOCALIZATION;  Surgeon: Andy Bannister A. Cornett, MD;  Location: [facility];  Service: General;  Laterality: Left;   BREAST SURGERY     Cyst removed   ENDOSCOPIC MUCOSAL RESECTION N/A 01/08/2023   Procedure: ENDOSCOPIC MUCOSAL RESECTION;  Surgeon: Brice Campi Albino Alu., MD;  Location: [facility];  Service: Gastroenterology;  Laterality: N/A;   EUS N/A 01/08/2023   Procedure: LOWER ENDOSCOPIC ULTRASOUND (EUS);  Surgeon: Normie Becton., MD;  Location: [facility];  Service: Gastroenterology;  Laterality: N/A;   FLEXIBLE SIGMOIDOSCOPY N/A 01/08/2023   Procedure: FLEXIBLE SIGMOIDOSCOPY;  Surgeon: Brice Campi Albino Alu., MD;  Location: [facility];  Service: Gastroenterology;  Laterality: N/A;   HEMOSTASIS CLIP PLACEMENT  01/08/2023   Procedure: HEMOSTASIS CLIP PLACEMENT;  Surgeon: Normie Becton., MD;  Location: [facility];  Service: Gastroenterology;;   HYSTEROSCOPY  2002   myomectomy   MYOMECTOMY  10/2005   PELVIC LAPAROSCOPY     Expl. Lap.   REFRACTIVE SURGERY  07/26/2022   SUBMUCOSAL LIFTING INJECTION  01/08/2023   Procedure: SUBMUCOSAL LIFTING INJECTION;  Surgeon: Brice Campi Albino Alu., MD;  Location: [facility];  Service: Gastroenterology;;   Current Facility-Administered Medications  Medication Dose  Route Frequency Provider Last Rate Last Admin   0.9 %  sodium chloride  infusion   Intravenous Continuous Mansouraty, Albino Alu., MD        Current Facility-Administered Medications:    0.9 %  sodium chloride  infusion, , Intravenous, Continuous, Mansouraty, Albino Alu., MD Allergies  Allergen Reactions   Latex Itching and Rash   Metformin  And Related Other (See Comments)    Diarrhea    Family History  Problem Relation Age of Onset   Hypertension Mother    Diabetes Mother    Stroke Brother    Migraines Brother    Migraines Brother    Heart disease Brother    Thyroid disease Brother    Cancer Maternal Uncle        Unknown type   Colon polyps Neg Hx    Colon cancer Neg Hx    Esophageal cancer Neg Hx    Stomach cancer Neg Hx    Rectal cancer Neg Hx    Social History   Socioeconomic History   Marital status: Single    Spouse name: Not on file   Number of children: Not on file   Years of education: Not on file   Highest education level: Not on file  Occupational History   Not on file  Tobacco Use   Smoking status: Every Day    Current packs/day: 0.50    Types: Cigarettes   Smokeless tobacco: Never  Vaping Use   Vaping status: Never Used  Substance and Sexual Activity   Alcohol use: Not  Currently    Alcohol/week: 5.0 standard drinks of alcohol    Types: 5 Cans of beer per week   Drug use: No   Sexual activity: Not Currently    Comment: 1st intercourse 58 yo-Fewer than 5 partners  Other Topics Concern   Not on file  Social History Narrative   Not on file   Social Drivers of Health   Financial Resource Strain: Not on file  Food Insecurity: No Food Insecurity (09/18/2022)   Hunger Vital Sign    Worried About Running Out of Food in the Last Year: Never true    Ran Out of Food in the Last Year: Never true  Transportation Needs: No Transportation Needs (09/18/2022)   PRAPARE - Administrator, Civil Service (Medical): No    Lack of Transportation  (Non-Medical): No  Physical Activity: Not on file  Stress: Not on file  Social Connections: Not on file  Intimate Partner Violence: Not on file    Physical Exam: Today's Vitals   11/15/23 1158  Weight: 72 kg   Body mass index is 25.62 kg/m. GEN: NAD EYE: Sclerae anicteric ENT: MMM CV: Non-tachycardic GI: Soft, NT/ND NEURO:  Alert & Oriented x 3  Lab Results: No results for input(s): WBC, HGB, HCT, PLT in the last 72 hours. BMET No results for input(s): NA, K, CL, CO2, GLUCOSE, BUN, CREATININE, CALCIUM  in the last 72 hours. LFT No results for input(s): PROT, ALBUMIN, AST, ALT, ALKPHOS, BILITOT, BILIDIR, IBILI in the last 72 hours. PT/INR No results for input(s): LABPROT, INR in the last 72 hours.   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  The risks of an EUS including intestinal perforation, bleeding, infection, aspiration, and medication effects were discussed as was the possibility it may not give a definitive diagnosis if a biopsy is performed.  When a biopsy of the pancreas is done as part of the EUS, there is an additional risk of pancreatitis at the rate of about 1-2%.  It was explained that procedure related pancreatitis is typically mild, although it can be severe and even life threatening, which is why we do not perform random pancreatic biopsies and only biopsy a lesion/area we feel is concerning enough to warrant the risk.   The risks and benefits of endoscopic evaluation/treatment were discussed with the patient and/or family; these include but are not limited to the risk of perforation, infection, bleeding, missed lesions, lack of diagnosis, severe illness requiring hospitalization, as well as anesthesia and sedation related illnesses.  The patient's history has been reviewed, patient examined, no change in status, and deemed stable for procedure.  The patient and/or  family is agreeable to proceed.    Yong Henle, MD [facility] Office # [phone]

## 2024-01-17 LAB — SURGICAL PATHOLOGY

## 2024-01-18 ENCOUNTER — Encounter (HOSPITAL_COMMUNITY): Payer: Self-pay | Admitting: Gastroenterology

## 2024-01-18 ENCOUNTER — Ambulatory Visit: Payer: Self-pay | Admitting: Gastroenterology

## 2024-01-30 ENCOUNTER — Encounter: Payer: Self-pay | Admitting: Obstetrics and Gynecology

## 2024-01-30 ENCOUNTER — Ambulatory Visit: Admitting: Obstetrics and Gynecology

## 2024-01-30 VITALS — BP 122/72 | HR 86 | Temp 98.7°F | Wt 165.0 lb

## 2024-01-30 DIAGNOSIS — D250 Submucous leiomyoma of uterus: Secondary | ICD-10-CM | POA: Diagnosis not present

## 2024-01-30 DIAGNOSIS — R9389 Abnormal findings on diagnostic imaging of other specified body structures: Secondary | ICD-10-CM

## 2024-01-30 DIAGNOSIS — N8500 Endometrial hyperplasia, unspecified: Secondary | ICD-10-CM | POA: Insufficient documentation

## 2024-01-30 NOTE — Progress Notes (Signed)
 58 y.o. G91P1001 female with endometrial mass, known fibroids, type 2 diabetes, tobacco use, asthma, neuroendocrine tumor of the colon (dx 2024) here for surgical consultation for endometrial polyp. Single. Referred by Dr. Nikki for surgical management.  According to Dr. Cyrilla last note: Evaluation started with a CT of abdomen and pelvis on 12/21/22 for her neuroendocrine tumor which showed a 3.3 cm subserosal fibroid and a 1 cm either a submucuous fibroid or uterine polyp.    Follow up office pelvic US  12/10/23: Uterus 7.40 x 6.01 x 5.20 cm. Multiple fibroids:  0.95 cm, 0.66 cm, 1.36 cm, 1.36 cm, 2.97 cm, 0.78 cm.  Largest fibroid is left lateral 3.2 cm.  Submucosal fibroid 1.1 cm. EMS 12.06 mm.  Cystic areas seen with possible feeder vessel.  Also suspected submucous fibroid 1.1 cm.  Left ovary:  2.72 x 1.69 x 1.37 cm.  Normal.  Right ovary:  2.28 x 1.63 x 1.33 cm.  Normal.  No adnexal masses.  No free fluid.   01/08/24 SIS: 2 filling defects noted: 1 cm submucosal fibroid, 15 x 3 mm intracavitary mass.  Failed EMB due to cervical stenosis.  She denies any postmenopausal bleeding. Asthma and tobacco use, using albuterol  twice a week.  Patient's last menstrual period was 10/06/2014.    Last PAP:    Component Value Date/Time   DIAGPAP  08/02/2022 0824    - Negative for intraepithelial lesion or malignancy (NILM)   HPVHIGH Negative 08/02/2022 0824   ADEQPAP  08/02/2022 0824    Satisfactory for evaluation; transformation zone component PRESENT.   GYN HISTORY: Fibroids, prior myomectomy Vaginal atrophy Hx left breast biopsy  OB History  Gravida Para Term Preterm AB Living  1 1 1   1   SAB IAB Ectopic Multiple Live Births          # Outcome Date GA Lbr Len/2nd Weight Sex Type Anes PTL Lv  1 Term             Past Medical History:  Diagnosis Date   Allergy    Anemia    Anxiety    Bell's palsy    Depression    Diabetes mellitus without complication (HCC)    Fibroid     Glaucoma    Open   Hypertension    Neuroendocrine tumor 2024   colon   Vertigo     Past Surgical History:  Procedure Laterality Date   BREAST EXCISIONAL BIOPSY Left    BREAST LUMPECTOMY WITH RADIOACTIVE SEED LOCALIZATION Left 01/12/2014   Procedure: LEFT BREAST LUMPECTOMY WITH RADIOACTIVE SEED LOCALIZATION;  Surgeon: Debby A. Cornett, MD;  Location: [facility];  Service: General;  Laterality: Left;   BREAST SURGERY     Cyst removed   ENDOSCOPIC MUCOSAL RESECTION N/A 01/08/2023   Procedure: ENDOSCOPIC MUCOSAL RESECTION;  Surgeon: Wilhelmenia Aloha Raddle., MD;  Location: [facility];  Service: Gastroenterology;  Laterality: N/A;   EUS N/A 01/08/2023   Procedure: LOWER ENDOSCOPIC ULTRASOUND (EUS);  Surgeon: Wilhelmenia Aloha Raddle., MD;  Location: [facility];  Service: Gastroenterology;  Laterality: N/A;   EUS N/A 01/16/2024   Procedure: ULTRASOUND, LOWER GI TRACT, ENDOSCOPIC;  Surgeon: Wilhelmenia Aloha Raddle., MD;  Location: [facility];  Service: Gastroenterology;  Laterality: N/A;   FLEXIBLE SIGMOIDOSCOPY N/A 01/08/2023   Procedure: FLEXIBLE SIGMOIDOSCOPY;  Surgeon: Wilhelmenia Aloha Raddle., MD;  Location: [facility];  Service: Gastroenterology;  Laterality: N/A;   FLEXIBLE SIGMOIDOSCOPY N/A 01/16/2024   Procedure: KINGSTON SIDE;  Surgeon: Wilhelmenia Aloha Raddle., MD;  Location: [facility];  Service: Gastroenterology;  Laterality: N/A;   HEMOSTASIS CLIP PLACEMENT  01/08/2023   Procedure: HEMOSTASIS CLIP PLACEMENT;  Surgeon: Wilhelmenia Aloha Raddle., MD;  Location: [facility];  Service: Gastroenterology;;   HYSTEROSCOPY  2002   myomectomy   MYOMECTOMY  10/2005   PELVIC LAPAROSCOPY     Expl. Lap.   POLYPECTOMY  01/16/2024   Procedure: POLYPECTOMY, INTESTINE;  Surgeon: Wilhelmenia Aloha Raddle., MD;  Location: [facility];  Service: Gastroenterology;;   REFRACTIVE SURGERY  07/26/2022   SUBMUCOSAL LIFTING INJECTION  01/08/2023   Procedure: SUBMUCOSAL LIFTING  INJECTION;  Surgeon: Wilhelmenia Aloha Raddle., MD;  Location: [facility];  Service: Gastroenterology;;    Current Outpatient Medications on File Prior to Visit  Medication Sig Dispense Refill   ACCU-CHEK GUIDE test strip TEST ONCE DAILY 100 strip 0   Accu-Chek Softclix Lancets lancets TEST ONCE DAILY 100 each 1   acetaminophen  (TYLENOL ) 325 MG tablet Take 2 tablets (650 mg total) by mouth every 6 (six) hours as needed for up to 30 doses for moderate pain or mild pain. 30 tablet 0   albuterol  (VENTOLIN  HFA) 108 (90 Base) MCG/ACT inhaler Inhale 2 puffs into the lungs every 6 (six) hours as needed (bronchitis/Asthma). 18 g 0   atorvastatin  (LIPITOR) 10 MG tablet Take 1 tablet (10 mg total) by mouth daily. 90 tablet 0   Biotin w/ Vitamins C & E (HAIR SKIN & NAILS GUMMIES PO) Take 2 Pieces of gum by mouth daily.     Blood Glucose Monitoring Suppl DEVI 1 each by Does not apply route daily. May substitute to any manufacturer covered by patient's insurance. 1 each 0   cyclobenzaprine  (FLEXERIL ) 10 MG tablet Take 1 tablet (10 mg total) by mouth 2 (two) times daily as needed for up to 14 doses for muscle spasms. 14 tablet 0   ibuprofen  (ADVIL ) 600 MG tablet Take 1 tablet (600 mg total) by mouth every 6 (six) hours as needed for up to 30 doses for headache or mild pain. 30 tablet 0   Multiple Vitamin (MULTIVITAMIN WITH MINERALS) TABS tablet Take 1 tablet by mouth daily.     prednisoLONE acetate (PRED FORTE) 1 % ophthalmic suspension Place 1 drop into the left eye daily.     sertraline  (ZOLOFT ) 25 MG tablet Take 1 tablet (25 mg total) by mouth daily. 90 tablet 1   No current facility-administered medications on file prior to visit.    Allergies  Allergen Reactions   Latex Itching and Rash   Metformin  And Related Other (See Comments)    Diarrhea       PE Today's Vitals   01/30/24 1201  BP: 122/72  Pulse: 86  Temp: 98.7 F (37.1 C)  TempSrc: Oral  SpO2: 99%  Weight: 165 lb (74.8 kg)    Body mass index is 25.84 kg/m.  Physical Exam Vitals reviewed.  Constitutional:      General: She is not in acute distress.    Appearance: Normal appearance.  HENT:     Head: Normocephalic and atraumatic.     Nose: Nose normal.  Eyes:     Extraocular Movements: Extraocular movements intact.     Conjunctiva/sclera: Conjunctivae normal.  Cardiovascular:     Rate and Rhythm: Normal rate and regular rhythm.     Heart sounds: No murmur heard.    No friction rub. No gallop.  Pulmonary:     Effort: Pulmonary effort is normal. No respiratory distress.     Breath sounds:  Normal breath sounds. No stridor. No wheezing, rhonchi or rales.  Musculoskeletal:        General: Normal range of motion.     Cervical back: Normal range of motion.  Neurological:     General: No focal deficit present.     Mental Status: She is alert.  Psychiatric:        Mood and Affect: Mood normal.        Behavior: Behavior normal.      Assessment and Plan:        Endometrial thickening on ultrasound Assessment & Plan: Plan for hysteroscopy, dilation and curettage, polypectomy.  Discussed outpatient procedure. Reviewed that  recovery is usually 1-2 days. Risks including infections, bleeding, and damage to surrounding organs reviewed. Recommend NPO prior to midnight and reviewed medication to take on day of surgery. Dicussed use of NSAIDS as needed for pain postoperatively.  Preop checklist: Antibiotics: none DVT ppx: SCDs Postop visit: 1 week Additional clearance: medical    Orders: -     Ambulatory Referral For Surgery Scheduling  Submucous uterine fibroid -     Ambulatory Referral For Surgery Scheduling    Vera LULLA Pa, MD

## 2024-01-30 NOTE — Assessment & Plan Note (Addendum)
 Discussed hysteroscopy vs hysterectomy. Patient desires hysteroscopy.  Plan for hysteroscopy, dilation and curettage, polypectomy.  Discussed outpatient procedure. Reviewed that  recovery is usually 1-2 days. Risks including infections, bleeding, and damage to surrounding organs reviewed. Recommend NPO prior to midnight and reviewed medication to take on day of surgery. Dicussed use of NSAIDS as needed for pain postoperatively.  Preop checklist: Antibiotics: none DVT ppx: SCDs Postop visit: 1 week Additional clearance: medical

## 2024-01-30 NOTE — Patient Instructions (Addendum)
 Preoperative instructions: Nothing to eat or drink after midnight, unless instructed differently regarding clear liquids by the anesthesia team at [facility]. Do not take any medications on the day of surgery, except those listed below: NONE Please follow all other instructions as provided by our surgical scheduler at [facility] and the anesthesia team at [facility].  Postoperative instructions: Take ibuprofen as prescribed and over-the-counter Tylenol as needed.

## 2024-02-19 ENCOUNTER — Encounter: Payer: Self-pay | Admitting: *Deleted

## 2024-02-19 ENCOUNTER — Telehealth: Payer: Self-pay | Admitting: Family Medicine

## 2024-02-19 NOTE — Telephone Encounter (Signed)
 Collected from the front desk looks like this is for a gynecological procedure using local anesthetic with no request for testing. Does she need an appointment to have this signed?

## 2024-02-19 NOTE — Telephone Encounter (Signed)
 Type of form received: Surgical Clearance Request  Additional comments:   Received by: Fax  Form should be Faxed/mailed to: (address/ fax #) [phone]  Is patient requesting call for pickup: N/A  Form placed:  Labeled & placed in provider bin  Attach charge sheet.  Provider will determine charge.  Individual made aware of 3-5 business day turn around? N/A

## 2024-02-20 NOTE — Telephone Encounter (Signed)
 I can review form then determine if visit needed.  Thanks

## 2024-02-26 ENCOUNTER — Encounter: Payer: Self-pay | Admitting: Family Medicine

## 2024-02-26 NOTE — Telephone Encounter (Addendum)
 Form reviewed, plan for diagnostic hysteroscopy, D&C, polypectomy with local standby MAC anesthesia. EKG on 10/04/2022 with normal sinus rhythm, normal PR interval, QTc 412.  No acute findings.  Most recent labs on May 29 with A1c of 7.3, and normal creatinine of 0.83, GFR 79.21.  Most recent CBC in March 2024 without anemia. Reassuring endoscopic ultrasound on June 19 for follow-up of prior rectal neuroendocrine tumor.   Contacted patient regarding this procedure.  No reactions to anesthesia in the past.  No hx of CVA, MI, or CHF.  Not on insulin. No CKD.  No CP/DOE with walking 2 city blocks. No CP with walking a flight of stairs, or with walking 1-2 miles in park.   Based on information above, and discussion with patient, no apparent increased risk of major adverse cardiac event and appears to be at acceptable risk for planned procedure.  Will complete letter for her gynecologist with this information.  All questions were answered from patient.    Paperwork completed and placed in fax bin at back nurse station

## 2024-02-27 NOTE — Telephone Encounter (Signed)
 Faxed paperwork and scanned to documents for patients chart. Placed in provider folder at front desk.

## 2024-03-16 NOTE — Progress Notes (Signed)
 58 y.o. G72P1001 female with endometrial mass, known fibroids, type 2 diabetes, tobacco use, asthma, neuroendocrine tumor of the colon (dx 2024) here for preop exam for diagnostic hysteroscopy, dilation and curettage, polypectomy. Single. Cleaner in the evenings. Referred by Dr. Nikki for surgical management.  According to Dr. Cyrilla last note: Evaluation started with a CT of abdomen and pelvis on 12/21/22 for her neuroendocrine tumor which showed a 3.3 cm subserosal fibroid and a 1 cm either a submucuous fibroid or uterine polyp.    Follow up office pelvic US  12/10/23: Uterus 7.40 x 6.01 x 5.20 cm. Multiple fibroids:  0.95 cm, 0.66 cm, 1.36 cm, 1.36 cm, 2.97 cm, 0.78 cm.  Largest fibroid is left lateral 3.2 cm.  Submucosal fibroid 1.1 cm. EMS 12.06 mm.  Cystic areas seen with possible feeder vessel.  Also suspected submucous fibroid 1.1 cm.  Left ovary:  2.72 x 1.69 x 1.37 cm.  Normal.  Right ovary:  2.28 x 1.63 x 1.33 cm.  Normal.  No adnexal masses.  No free fluid.   01/08/24 SIS: 2 filling defects noted: 1 cm submucosal fibroid, 15 x 3 mm intracavitary mass.  Failed EMB due to cervical stenosis.  She denies any postmenopausal bleeding. Asthma and tobacco use, using albuterol  twice a week. No CP or SOB.  Patient's last menstrual period was 10/06/2014.    Last PAP:    Component Value Date/Time   DIAGPAP  08/02/2022 0824    - Negative for intraepithelial lesion or malignancy (NILM)   HPVHIGH Negative 08/02/2022 0824   ADEQPAP  08/02/2022 0824    Satisfactory for evaluation; transformation zone component PRESENT.   GYN HISTORY: Fibroids, prior myomectomy Vaginal atrophy Hx left breast biopsy  OB History  Gravida Para Term Preterm AB Living  1 1 1   1   SAB IAB Ectopic Multiple Live Births      1    # Outcome Date GA Lbr Len/2nd Weight Sex Type Anes PTL Lv  1 Term      Vag-Spont   LIV    Past Medical History:  Diagnosis Date   Allergy    Anemia    Anxiety    Bell's  palsy    Depression    Diabetes mellitus without complication (HCC)    Fibroid    Glaucoma    Open   Hypertension    Neuroendocrine tumor 2024   colon   Vertigo     Past Surgical History:  Procedure Laterality Date   BREAST EXCISIONAL BIOPSY Left    BREAST LUMPECTOMY WITH RADIOACTIVE SEED LOCALIZATION Left 01/12/2014   Procedure: LEFT BREAST LUMPECTOMY WITH RADIOACTIVE SEED LOCALIZATION;  Surgeon: Debby A. Cornett, MD;  Location: [facility];  Service: General;  Laterality: Left;   BREAST SURGERY     Cyst removed   ENDOSCOPIC MUCOSAL RESECTION N/A 01/08/2023   Procedure: ENDOSCOPIC MUCOSAL RESECTION;  Surgeon: Wilhelmenia Aloha Raddle., MD;  Location: [facility];  Service: Gastroenterology;  Laterality: N/A;   EUS N/A 01/08/2023   Procedure: LOWER ENDOSCOPIC ULTRASOUND (EUS);  Surgeon: Wilhelmenia Aloha Raddle., MD;  Location: [facility];  Service: Gastroenterology;  Laterality: N/A;   EUS N/A 01/16/2024   Procedure: ULTRASOUND, LOWER GI TRACT, ENDOSCOPIC;  Surgeon: Wilhelmenia Aloha Raddle., MD;  Location: [facility];  Service: Gastroenterology;  Laterality: N/A;   FLEXIBLE SIGMOIDOSCOPY N/A 01/08/2023   Procedure: FLEXIBLE SIGMOIDOSCOPY;  Surgeon: Wilhelmenia Aloha Raddle., MD;  Location: [facility];  Service: Gastroenterology;  Laterality: N/A;   FLEXIBLE SIGMOIDOSCOPY N/A  01/16/2024   Procedure: KINGSTON SIDE;  Surgeon: Wilhelmenia Aloha Raddle., MD;  Location: [facility];  Service: Gastroenterology;  Laterality: N/A;   HEMOSTASIS CLIP PLACEMENT  01/08/2023   Procedure: HEMOSTASIS CLIP PLACEMENT;  Surgeon: Wilhelmenia Aloha Raddle., MD;  Location: [facility];  Service: Gastroenterology;;   HYSTEROSCOPY  2002   myomectomy   MYOMECTOMY  10/2005   PELVIC LAPAROSCOPY     Expl. Lap.   POLYPECTOMY  01/16/2024   Procedure: POLYPECTOMY, INTESTINE;  Surgeon: Wilhelmenia Aloha Raddle., MD;  Location: [facility];  Service: Gastroenterology;;   REFRACTIVE SURGERY   07/26/2022   SUBMUCOSAL LIFTING INJECTION  01/08/2023   Procedure: SUBMUCOSAL LIFTING INJECTION;  Surgeon: Wilhelmenia Aloha Raddle., MD;  Location: [facility];  Service: Gastroenterology;;    Current Outpatient Medications on File Prior to Visit  Medication Sig Dispense Refill   ACCU-CHEK GUIDE test strip TEST ONCE DAILY 100 strip 0   Accu-Chek Softclix Lancets lancets TEST ONCE DAILY 100 each 1   acetaminophen  (TYLENOL ) 325 MG tablet Take 2 tablets (650 mg total) by mouth every 6 (six) hours as needed for up to 30 doses for moderate pain or mild pain. 30 tablet 0   albuterol  (VENTOLIN  HFA) 108 (90 Base) MCG/ACT inhaler Inhale 2 puffs into the lungs every 6 (six) hours as needed (bronchitis/Asthma). 18 g 0   atorvastatin  (LIPITOR) 10 MG tablet Take 1 tablet (10 mg total) by mouth daily. 90 tablet 0   Biotin w/ Vitamins C & E (HAIR SKIN & NAILS GUMMIES PO) Take 2 Pieces of gum by mouth daily.     Blood Glucose Monitoring Suppl DEVI 1 each by Does not apply route daily. May substitute to any manufacturer covered by patient's insurance. 1 each 0   cyclobenzaprine  (FLEXERIL ) 10 MG tablet Take 1 tablet (10 mg total) by mouth 2 (two) times daily as needed for up to 14 doses for muscle spasms. 14 tablet 0   ibuprofen  (ADVIL ) 600 MG tablet Take 1 tablet (600 mg total) by mouth every 6 (six) hours as needed for up to 30 doses for headache or mild pain. 30 tablet 0   Multiple Vitamin (MULTIVITAMIN WITH MINERALS) TABS tablet Take 1 tablet by mouth daily.     prednisoLONE acetate (PRED FORTE) 1 % ophthalmic suspension Place 1 drop into the left eye daily.     sertraline  (ZOLOFT ) 25 MG tablet Take 1 tablet (25 mg total) by mouth daily. 90 tablet 1   No current facility-administered medications on file prior to visit.    Allergies  Allergen Reactions   Latex Itching and Rash   Metformin  And Related Other (See Comments)    Diarrhea       PE Today's Vitals   03/17/24 0913  BP: 122/78  Pulse: 83   Temp: 98.1 F (36.7 C)  TempSrc: Oral  SpO2: 98%  Weight: 170 lb (77.1 kg)   Body mass index is 26.63 kg/m.  Physical Exam Vitals reviewed.  Constitutional:      General: She is not in acute distress.    Appearance: Normal appearance.  HENT:     Head: Normocephalic and atraumatic.     Nose: Nose normal.  Eyes:     Extraocular Movements: Extraocular movements intact.     Conjunctiva/sclera: Conjunctivae normal.  Cardiovascular:     Rate and Rhythm: Normal rate and regular rhythm.     Heart sounds: No murmur heard.    No friction rub. No gallop.  Pulmonary:     Effort:  Pulmonary effort is normal. No respiratory distress.     Breath sounds: Normal breath sounds. No stridor. No wheezing, rhonchi or rales.  Musculoskeletal:        General: Normal range of motion.     Cervical back: Normal range of motion.  Neurological:     General: No focal deficit present.     Mental Status: She is alert.  Psychiatric:        Mood and Affect: Mood normal.        Behavior: Behavior normal.      Assessment and Plan:        Endometrial thickening on ultrasound Submucous uterine fibroid Assessment & Plan: Discussed hysteroscopy vs hysterectomy. Patient desires hysteroscopy.  Plan for hysteroscopy, dilation and curettage, polypectomy.  Discussed outpatient procedure. Reviewed that  recovery is usually 1-2 days. Risks including infections, bleeding, and damage to surrounding organs reviewed. Recommend NPO prior to midnight and reviewed medication to take on day of surgery. Dicussed use of NSAIDS as needed for pain postoperatively.  Preop checklist: Antibiotics: none DVT ppx: SCDs Postop visit: 1 week Additional clearance: [x]  Cleared by PCP 02/26/24   Vera LULLA Pa, MD

## 2024-03-16 NOTE — H&P (View-Only) (Signed)
 58 y.o. G4P1001 female with endometrial mass, known fibroids, type 2 diabetes, tobacco use, asthma, neuroendocrine tumor of the colon (dx 2024) here for preop exam for diagnostic hysteroscopy, dilation and curettage, polypectomy. Single. Cleaner in the evenings. Referred by Dr. Nikki for surgical management.  According to Dr. Cyrilla last note: Evaluation started with a CT of abdomen and pelvis on 12/21/22 for her neuroendocrine tumor which showed a 3.3 cm subserosal fibroid and a 1 cm either a submucuous fibroid or uterine polyp.    Follow up office pelvic US  12/10/23: Uterus 7.40 x 6.01 x 5.20 cm. Multiple fibroids:  0.95 cm, 0.66 cm, 1.36 cm, 1.36 cm, 2.97 cm, 0.78 cm.  Largest fibroid is left lateral 3.2 cm.  Submucosal fibroid 1.1 cm. EMS 12.06 mm.  Cystic areas seen with possible feeder vessel.  Also suspected submucous fibroid 1.1 cm.  Left ovary:  2.72 x 1.69 x 1.37 cm.  Normal.  Right ovary:  2.28 x 1.63 x 1.33 cm.  Normal.  No adnexal masses.  No free fluid.   01/08/24 SIS: 2 filling defects noted: 1 cm submucosal fibroid, 15 x 3 mm intracavitary mass.  Failed EMB due to cervical stenosis.  She denies any postmenopausal bleeding. Asthma and tobacco use, using albuterol  twice a week. No CP or SOB.  Patient's last menstrual period was 10/06/2014.    Last PAP:    Component Value Date/Time   DIAGPAP  08/02/2022 0824    - Negative for intraepithelial lesion or malignancy (NILM)   HPVHIGH Negative 08/02/2022 0824   ADEQPAP  08/02/2022 0824    Satisfactory for evaluation; transformation zone component PRESENT.   GYN HISTORY: Fibroids, prior myomectomy Vaginal atrophy Hx left breast biopsy  OB History  Gravida Para Term Preterm AB Living  1 1 1   1   SAB IAB Ectopic Multiple Live Births      1    # Outcome Date GA Lbr Len/2nd Weight Sex Type Anes PTL Lv  1 Term      Vag-Spont   LIV    Past Medical History:  Diagnosis Date   Allergy    Anemia    Anxiety    Bell's  palsy    Depression    Diabetes mellitus without complication (HCC)    Fibroid    Glaucoma    Open   Hypertension    Neuroendocrine tumor 2024   colon   Vertigo     Past Surgical History:  Procedure Laterality Date   BREAST EXCISIONAL BIOPSY Left    BREAST LUMPECTOMY WITH RADIOACTIVE SEED LOCALIZATION Left 01/12/2014   Procedure: LEFT BREAST LUMPECTOMY WITH RADIOACTIVE SEED LOCALIZATION;  Surgeon: Debby A. Cornett, MD;  Location: [facility];  Service: General;  Laterality: Left;   BREAST SURGERY     Cyst removed   ENDOSCOPIC MUCOSAL RESECTION N/A 01/08/2023   Procedure: ENDOSCOPIC MUCOSAL RESECTION;  Surgeon: Wilhelmenia Aloha Raddle., MD;  Location: [facility];  Service: Gastroenterology;  Laterality: N/A;   EUS N/A 01/08/2023   Procedure: LOWER ENDOSCOPIC ULTRASOUND (EUS);  Surgeon: Wilhelmenia Aloha Raddle., MD;  Location: [facility];  Service: Gastroenterology;  Laterality: N/A;   EUS N/A 01/16/2024   Procedure: ULTRASOUND, LOWER GI TRACT, ENDOSCOPIC;  Surgeon: Wilhelmenia Aloha Raddle., MD;  Location: [facility];  Service: Gastroenterology;  Laterality: N/A;   FLEXIBLE SIGMOIDOSCOPY N/A 01/08/2023   Procedure: FLEXIBLE SIGMOIDOSCOPY;  Surgeon: Wilhelmenia Aloha Raddle., MD;  Location: [facility];  Service: Gastroenterology;  Laterality: N/A;   FLEXIBLE SIGMOIDOSCOPY N/A  01/16/2024   Procedure: KINGSTON SIDE;  Surgeon: Wilhelmenia Aloha Raddle., MD;  Location: [facility];  Service: Gastroenterology;  Laterality: N/A;   HEMOSTASIS CLIP PLACEMENT  01/08/2023   Procedure: HEMOSTASIS CLIP PLACEMENT;  Surgeon: Wilhelmenia Aloha Raddle., MD;  Location: [facility];  Service: Gastroenterology;;   HYSTEROSCOPY  2002   myomectomy   MYOMECTOMY  10/2005   PELVIC LAPAROSCOPY     Expl. Lap.   POLYPECTOMY  01/16/2024   Procedure: POLYPECTOMY, INTESTINE;  Surgeon: Wilhelmenia Aloha Raddle., MD;  Location: [facility];  Service: Gastroenterology;;   REFRACTIVE SURGERY   07/26/2022   SUBMUCOSAL LIFTING INJECTION  01/08/2023   Procedure: SUBMUCOSAL LIFTING INJECTION;  Surgeon: Wilhelmenia Aloha Raddle., MD;  Location: [facility];  Service: Gastroenterology;;    Current Outpatient Medications on File Prior to Visit  Medication Sig Dispense Refill   ACCU-CHEK GUIDE test strip TEST ONCE DAILY 100 strip 0   Accu-Chek Softclix Lancets lancets TEST ONCE DAILY 100 each 1   acetaminophen  (TYLENOL ) 325 MG tablet Take 2 tablets (650 mg total) by mouth every 6 (six) hours as needed for up to 30 doses for moderate pain or mild pain. 30 tablet 0   albuterol  (VENTOLIN  HFA) 108 (90 Base) MCG/ACT inhaler Inhale 2 puffs into the lungs every 6 (six) hours as needed (bronchitis/Asthma). 18 g 0   atorvastatin  (LIPITOR) 10 MG tablet Take 1 tablet (10 mg total) by mouth daily. 90 tablet 0   Biotin w/ Vitamins C & E (HAIR SKIN & NAILS GUMMIES PO) Take 2 Pieces of gum by mouth daily.     Blood Glucose Monitoring Suppl DEVI 1 each by Does not apply route daily. May substitute to any manufacturer covered by patient's insurance. 1 each 0   cyclobenzaprine  (FLEXERIL ) 10 MG tablet Take 1 tablet (10 mg total) by mouth 2 (two) times daily as needed for up to 14 doses for muscle spasms. 14 tablet 0   ibuprofen  (ADVIL ) 600 MG tablet Take 1 tablet (600 mg total) by mouth every 6 (six) hours as needed for up to 30 doses for headache or mild pain. 30 tablet 0   Multiple Vitamin (MULTIVITAMIN WITH MINERALS) TABS tablet Take 1 tablet by mouth daily.     prednisoLONE acetate (PRED FORTE) 1 % ophthalmic suspension Place 1 drop into the left eye daily.     sertraline  (ZOLOFT ) 25 MG tablet Take 1 tablet (25 mg total) by mouth daily. 90 tablet 1   No current facility-administered medications on file prior to visit.    Allergies  Allergen Reactions   Latex Itching and Rash   Metformin  And Related Other (See Comments)    Diarrhea       PE Today's Vitals   03/17/24 0913  BP: 122/78  Pulse: 83   Temp: 98.1 F (36.7 C)  TempSrc: Oral  SpO2: 98%  Weight: 170 lb (77.1 kg)   Body mass index is 26.63 kg/m.  Physical Exam Vitals reviewed.  Constitutional:      General: She is not in acute distress.    Appearance: Normal appearance.  HENT:     Head: Normocephalic and atraumatic.     Nose: Nose normal.  Eyes:     Extraocular Movements: Extraocular movements intact.     Conjunctiva/sclera: Conjunctivae normal.  Cardiovascular:     Rate and Rhythm: Normal rate and regular rhythm.     Heart sounds: No murmur heard.    No friction rub. No gallop.  Pulmonary:     Effort:  Pulmonary effort is normal. No respiratory distress.     Breath sounds: Normal breath sounds. No stridor. No wheezing, rhonchi or rales.  Musculoskeletal:        General: Normal range of motion.     Cervical back: Normal range of motion.  Neurological:     General: No focal deficit present.     Mental Status: She is alert.  Psychiatric:        Mood and Affect: Mood normal.        Behavior: Behavior normal.      Assessment and Plan:        Endometrial thickening on ultrasound Submucous uterine fibroid Assessment & Plan: Discussed hysteroscopy vs hysterectomy. Patient desires hysteroscopy.  Plan for hysteroscopy, dilation and curettage, polypectomy.  Discussed outpatient procedure. Reviewed that  recovery is usually 1-2 days. Risks including infections, bleeding, and damage to surrounding organs reviewed. Recommend NPO prior to midnight and reviewed medication to take on day of surgery. Dicussed use of NSAIDS as needed for pain postoperatively.  Preop checklist: Antibiotics: none DVT ppx: SCDs Postop visit: 1 week Additional clearance: [x]  Cleared by PCP 02/26/24   Brandi LULLA Pa, MD

## 2024-03-17 ENCOUNTER — Ambulatory Visit (INDEPENDENT_AMBULATORY_CARE_PROVIDER_SITE_OTHER): Admitting: Obstetrics and Gynecology

## 2024-03-17 ENCOUNTER — Encounter: Payer: Self-pay | Admitting: *Deleted

## 2024-03-17 ENCOUNTER — Encounter: Payer: Self-pay | Admitting: Obstetrics and Gynecology

## 2024-03-17 VITALS — BP 122/78 | HR 83 | Temp 98.1°F | Wt 170.0 lb

## 2024-03-17 DIAGNOSIS — R9389 Abnormal findings on diagnostic imaging of other specified body structures: Secondary | ICD-10-CM

## 2024-03-17 DIAGNOSIS — D250 Submucous leiomyoma of uterus: Secondary | ICD-10-CM | POA: Diagnosis not present

## 2024-03-17 NOTE — Assessment & Plan Note (Signed)
 Discussed hysteroscopy vs hysterectomy. Patient desires hysteroscopy.  Plan for hysteroscopy, dilation and curettage, polypectomy.  Discussed outpatient procedure. Reviewed that  recovery is usually 1-2 days. Risks including infections, bleeding, and damage to surrounding organs reviewed. Recommend NPO prior to midnight and reviewed medication to take on day of surgery. Dicussed use of NSAIDS as needed for pain postoperatively.  Preop checklist: Antibiotics: none DVT ppx: SCDs Postop visit: 1 week Additional clearance: [x]  Cleared by PCP 02/26/24

## 2024-03-17 NOTE — Patient Instructions (Addendum)
 Preoperative instructions: Nothing to eat or drink after midnight, unless instructed differently regarding clear liquids by the anesthesia team at [facility]. Do not take any medications on the day of surgery, except those listed below: NONE Please follow all other instructions as provided by our surgical scheduler at [facility] and the anesthesia team at [facility].  Postoperative instructions: Take ibuprofen as prescribed and over-the-counter Tylenol as needed.

## 2024-03-19 ENCOUNTER — Encounter (HOSPITAL_COMMUNITY): Payer: Self-pay | Admitting: Obstetrics and Gynecology

## 2024-03-23 ENCOUNTER — Encounter (HOSPITAL_COMMUNITY): Payer: Self-pay | Admitting: Obstetrics and Gynecology

## 2024-03-23 NOTE — Progress Notes (Signed)
 Spoke w/ via phone for pre-op interview--- pt Lab needs dos----  cbc/ bmp/ A1c/ EKG (per anes)       Lab results------  no COVID test -----patient states asymptomatic no test needed Arrive at -------  1300 on 03-24-2024 NPO after MN NO Solid Food.  Clear liquids from MN until--- 1200 Pre-Surgery Ensure or G2: n/a  Med rec completed Medications to take morning of surgery ----- zoloft  Diabetic medication ----- n/a  GLP1 agonist last dose: n/a GLP1 instructions:  Patient instructed no nail polish to be worn day of surgery Patient instructed to bring photo id and insurance card day of surgery Patient aware to have Driver (ride ) / caregiver    for 24 hours after surgery - friend, chyrl rocks Patient Special Instructions -----  antibacterial soap shower morning of surgery.  Asked to bring rescue inhaler dos Pre-Op special Instructions -----  sent inbox message to Dr Dallie in epic on 03-18-2024,  requested pre-op orders.  Patient verbalized understanding of instructions that were given at this phone interview. Patient denies chest pain, sob, fever, cough at the interview.

## 2024-03-24 ENCOUNTER — Ambulatory Visit (HOSPITAL_COMMUNITY)

## 2024-03-24 ENCOUNTER — Encounter (HOSPITAL_COMMUNITY)
Admission: RE | Disposition: A | Discharge status: Home / Self Care | Payer: Self-pay | Source: Home / Self Care | Attending: Obstetrics and Gynecology

## 2024-03-24 ENCOUNTER — Encounter (HOSPITAL_COMMUNITY): Payer: Self-pay | Admitting: Obstetrics and Gynecology

## 2024-03-24 ENCOUNTER — Ambulatory Visit (HOSPITAL_COMMUNITY)
Admission: RE | Admit: 2024-03-24 | Discharge: 2024-03-24 | Disposition: A | Discharge status: Home / Self Care | Attending: Obstetrics and Gynecology | Admitting: Obstetrics and Gynecology

## 2024-03-24 ENCOUNTER — Other Ambulatory Visit: Payer: Self-pay

## 2024-03-24 DIAGNOSIS — R9389 Abnormal findings on diagnostic imaging of other specified body structures: Secondary | ICD-10-CM | POA: Diagnosis present

## 2024-03-24 DIAGNOSIS — F419 Anxiety disorder, unspecified: Secondary | ICD-10-CM | POA: Insufficient documentation

## 2024-03-24 DIAGNOSIS — E119 Type 2 diabetes mellitus without complications: Secondary | ICD-10-CM | POA: Diagnosis not present

## 2024-03-24 DIAGNOSIS — J45909 Unspecified asthma, uncomplicated: Secondary | ICD-10-CM

## 2024-03-24 DIAGNOSIS — F32A Depression, unspecified: Secondary | ICD-10-CM | POA: Insufficient documentation

## 2024-03-24 DIAGNOSIS — N840 Polyp of corpus uteri: Secondary | ICD-10-CM | POA: Insufficient documentation

## 2024-03-24 DIAGNOSIS — D250 Submucous leiomyoma of uterus: Secondary | ICD-10-CM | POA: Diagnosis present

## 2024-03-24 DIAGNOSIS — N882 Stricture and stenosis of cervix uteri: Secondary | ICD-10-CM | POA: Diagnosis not present

## 2024-03-24 DIAGNOSIS — F418 Other specified anxiety disorders: Secondary | ICD-10-CM

## 2024-03-24 DIAGNOSIS — Z01818 Encounter for other preprocedural examination: Secondary | ICD-10-CM

## 2024-03-24 DIAGNOSIS — F17210 Nicotine dependence, cigarettes, uncomplicated: Secondary | ICD-10-CM

## 2024-03-24 HISTORY — DX: Abnormal findings on diagnostic imaging of other specified body structures: R93.89

## 2024-03-24 HISTORY — DX: Type 2 diabetes mellitus without complications: E11.9

## 2024-03-24 HISTORY — DX: Personal history of other diseases of the nervous system and sense organs: Z86.69

## 2024-03-24 HISTORY — PX: DILATATION & CURRETTAGE/HYSTEROSCOPY WITH RESECTOCOPE: SHX5572

## 2024-03-24 HISTORY — DX: Presence of dental prosthetic device (complete) (partial): Z97.2

## 2024-03-24 HISTORY — PX: MYOSURE RESECTION: SHX7611

## 2024-03-24 HISTORY — DX: Submucous leiomyoma of uterus: D25.0

## 2024-03-24 HISTORY — DX: Unspecified glaucoma: H40.9

## 2024-03-24 LAB — CBC
HCT: 47.2 % — ABNORMAL HIGH (ref 36.0–46.0)
Hemoglobin: 14.8 g/dL (ref 12.0–15.0)
MCH: 29.1 pg (ref 26.0–34.0)
MCHC: 31.4 g/dL (ref 30.0–36.0)
MCV: 92.7 fL (ref 80.0–100.0)
Platelets: 239 K/uL (ref 150–400)
RBC: 5.09 MIL/uL (ref 3.87–5.11)
RDW: 13.9 % (ref 11.5–15.5)
WBC: 5 K/uL (ref 4.0–10.5)
nRBC: 0 % (ref 0.0–0.2)

## 2024-03-24 LAB — BASIC METABOLIC PANEL WITH GFR
Anion gap: 11 (ref 5–15)
BUN: 7 mg/dL (ref 6–20)
CO2: 21 mmol/L — ABNORMAL LOW (ref 22–32)
Calcium: 8.4 mg/dL — ABNORMAL LOW (ref 8.9–10.3)
Chloride: 106 mmol/L (ref 98–111)
Creatinine, Ser: 0.84 mg/dL (ref 0.44–1.00)
GFR, Estimated: >60 mL/min (ref >60)
Glucose, Bld: 95 mg/dL (ref 70–99)
Potassium: 4.1 mmol/L (ref 3.5–5.1)
Sodium: 138 mmol/L (ref 135–145)

## 2024-03-24 LAB — HEMOGLOBIN A1C
Hgb A1c MFr Bld: 7.2 % — ABNORMAL HIGH (ref 4.8–5.6)
Mean Plasma Glucose: 159.94 mg/dL

## 2024-03-24 LAB — GLUCOSE, CAPILLARY
Glucose-Capillary: 85 mg/dL (ref 70–99)
Glucose-Capillary: 101 mg/dL — ABNORMAL HIGH (ref 70–99)

## 2024-03-24 LAB — POCT PREGNANCY, URINE: Preg Test, Ur: NEGATIVE

## 2024-03-24 SURGERY — DILATATION & CURETTAGE/HYSTEROSCOPY WITH RESECTOCOPE
Anesthesia: General | Site: Uterus

## 2024-03-24 MED ORDER — ACETAMINOPHEN 500 MG PO TABS
ORAL_TABLET | ORAL | Status: DC
Start: 2024-03-24 — End: 2024-03-24
  Filled 2024-03-24: qty 2

## 2024-03-24 MED ORDER — HYDROMORPHONE HCL 1 MG/ML IJ SOLN
0.2500 mg | INTRAMUSCULAR | Status: DC | PRN
  Administered 2024-03-24 (×2): 0.25 mg via INTRAVENOUS

## 2024-03-24 MED ORDER — LIDOCAINE 2% (20 MG/ML) 5 ML SYRINGE
INTRAMUSCULAR | Status: AC
  Filled 2024-03-24: qty 5

## 2024-03-24 MED ORDER — SODIUM CHLORIDE 0.9 % IR SOLN
Status: DC | PRN
  Administered 2024-03-24: 3000 mL

## 2024-03-24 MED ORDER — CHLORHEXIDINE GLUCONATE 0.12 % MT SOLN
OROMUCOSAL | Status: AC
  Filled 2024-03-24: qty 15

## 2024-03-24 MED ORDER — EPHEDRINE SULFATE-NACL 50-0.9 MG/10ML-% IV SOSY
PREFILLED_SYRINGE | INTRAVENOUS | Status: DC | PRN
  Administered 2024-03-24: 7.5 mg via INTRAVENOUS

## 2024-03-24 MED ORDER — LIDOCAINE 2% (20 MG/ML) 5 ML SYRINGE
INTRAMUSCULAR | Status: DC | PRN
  Administered 2024-03-24: 10 mg via INTRAVENOUS
  Administered 2024-03-24: 80 mg via INTRAVENOUS

## 2024-03-24 MED ORDER — DEXAMETHASONE SODIUM PHOSPHATE 10 MG/ML IJ SOLN
INTRAMUSCULAR | Status: AC
  Filled 2024-03-24: qty 1

## 2024-03-24 MED ORDER — FENTANYL CITRATE (PF) 100 MCG/2ML IJ SOLN
INTRAMUSCULAR | Status: AC
Start: 2024-03-24 — End: 2024-03-24
  Filled 2024-03-24: qty 2

## 2024-03-24 MED ORDER — FENTANYL CITRATE (PF) 250 MCG/5ML IJ SOLN
INTRAMUSCULAR | Status: DC | PRN
  Administered 2024-03-24: 50 ug via INTRAVENOUS

## 2024-03-24 MED ORDER — OXYCODONE HCL 5 MG/5ML PO SOLN: 5.0000 mg | Freq: Once | ORAL | Status: DC | PRN

## 2024-03-24 MED ORDER — OXYCODONE HCL 5 MG PO TABS: 5.0000 mg | ORAL_TABLET | Freq: Once | ORAL | Status: DC | PRN

## 2024-03-24 MED ORDER — LIDOCAINE HCL (PF) 1 % IJ SOLN
INTRAMUSCULAR | Status: DC | PRN
  Administered 2024-03-24: 10 mL

## 2024-03-24 MED ORDER — PROPOFOL 10 MG/ML IV BOLUS
INTRAVENOUS | Status: DC | PRN
  Administered 2024-03-24: 200 mg via INTRAVENOUS
  Administered 2024-03-24: 20 mg via INTRAVENOUS

## 2024-03-24 MED ORDER — AMISULPRIDE (ANTIEMETIC) 5 MG/2ML IV SOLN: 10.0000 mg | Freq: Once | INTRAVENOUS | Status: DC | PRN

## 2024-03-24 MED ORDER — ONDANSETRON HCL 4 MG/2ML IJ SOLN: 4.0000 mg | Freq: Once | INTRAMUSCULAR | Status: DC | PRN

## 2024-03-24 MED ORDER — ORAL CARE MOUTH RINSE: 15.0000 mL | Freq: Once | OROMUCOSAL | Status: AC

## 2024-03-24 MED ORDER — PHENYLEPHRINE 80 MCG/ML (10ML) SYRINGE FOR IV PUSH (FOR BLOOD PRESSURE SUPPORT)
PREFILLED_SYRINGE | INTRAVENOUS | Status: DC | PRN
  Administered 2024-03-24: 160 ug via INTRAVENOUS

## 2024-03-24 MED ORDER — ONDANSETRON HCL 4 MG/2ML IJ SOLN
INTRAMUSCULAR | Status: DC | PRN
  Administered 2024-03-24: 4 mg via INTRAVENOUS

## 2024-03-24 MED ORDER — ONDANSETRON HCL 4 MG/2ML IJ SOLN
INTRAMUSCULAR | Status: AC
  Filled 2024-03-24: qty 2

## 2024-03-24 MED ORDER — CELECOXIB 200 MG PO CAPS
ORAL_CAPSULE | ORAL | Status: DC
Start: 2024-03-24 — End: 2024-03-24
  Filled 2024-03-24: qty 2

## 2024-03-24 MED ORDER — PROPOFOL 10 MG/ML IV BOLUS
INTRAVENOUS | Status: AC
  Filled 2024-03-24: qty 20

## 2024-03-24 MED ORDER — ACETAMINOPHEN 500 MG PO TABS
1000.0000 mg | ORAL_TABLET | ORAL | Status: AC
  Administered 2024-03-24: 1000 mg via ORAL

## 2024-03-24 MED ORDER — LACTATED RINGERS IV SOLN: INTRAVENOUS | Status: DC

## 2024-03-24 MED ORDER — CHLORHEXIDINE GLUCONATE 0.12 % MT SOLN
15.0000 mL | Freq: Once | OROMUCOSAL | Status: AC
  Administered 2024-03-24: 15 mL via OROMUCOSAL

## 2024-03-24 MED ORDER — CELECOXIB 200 MG PO CAPS
400.0000 mg | ORAL_CAPSULE | ORAL | Status: AC
  Administered 2024-03-24: 400 mg via ORAL

## 2024-03-24 MED ORDER — HYDROMORPHONE HCL 1 MG/ML IJ SOLN
INTRAMUSCULAR | Status: AC
Start: 2024-03-24 — End: 2024-03-24
  Filled 2024-03-24: qty 1

## 2024-03-24 MED ORDER — LIDOCAINE HCL (PF) 1 % IJ SOLN
INTRAMUSCULAR | Status: AC
  Filled 2024-03-24: qty 30

## 2024-03-24 MED ORDER — DEXAMETHASONE SODIUM PHOSPHATE 10 MG/ML IJ SOLN
INTRAMUSCULAR | Status: DC | PRN
  Administered 2024-03-24: 10 mg via INTRAVENOUS

## 2024-03-24 SURGICAL SUPPLY — 19 items
COVER MAYO STAND STRL (DRAPES) ×3 IMPLANT
DEVICE MYOSURE LITE (MISCELLANEOUS) IMPLANT
DEVICE MYOSURE REACH (MISCELLANEOUS) IMPLANT
DILATOR CANAL MILEX (MISCELLANEOUS) IMPLANT
GAUZE 4X4 16PLY ~~LOC~~+RFID DBL (SPONGE) IMPLANT
GLOVE BIO SURGEON STRL SZ7 (GLOVE) ×3 IMPLANT
GLOVE BIOGEL PI IND STRL 7.0 (GLOVE) ×3 IMPLANT
GOWN STRL REUS W/ TWL XL LVL3 (GOWN DISPOSABLE) ×3 IMPLANT
HIBICLENS CHG 4% 4OZ BTL (MISCELLANEOUS) ×3 IMPLANT
KIT PROCEDURE FLUENT (KITS) ×3 IMPLANT
KIT TURNOVER KIT B (KITS) ×3 IMPLANT
PACK VAGINAL MINOR WOMEN LF (CUSTOM PROCEDURE TRAY) ×3 IMPLANT
PAD OB MATERNITY 11 LF (PERSONAL CARE ITEMS) ×3 IMPLANT
PAD PREP 24X48 CUFFED NSTRL (MISCELLANEOUS) ×3 IMPLANT
SEAL ROD LENS SCOPE MYOSURE (ABLATOR) ×3 IMPLANT
SOL .9 NS 3000ML IRR UROMATIC (IV SOLUTION) ×3 IMPLANT
SYSTEM TISS REMOVAL MYOSURE XL (MISCELLANEOUS) IMPLANT
TOWEL GREEN STERILE (TOWEL DISPOSABLE) IMPLANT
TOWEL OR 17X24 6PK STRL BLUE (TOWEL DISPOSABLE) ×3 IMPLANT

## 2024-03-24 NOTE — Anesthesia Procedure Notes (Signed)
 Procedure Name: LMA Insertion Date/Time: 03/24/2024 3:15 PM  Performed by: Kearney Rosina SAILOR, RNPre-anesthesia Checklist: Patient identified, Patient being monitored, Timeout performed, Emergency Drugs available and Suction available Patient Re-evaluated:Patient Re-evaluated prior to induction Oxygen Delivery Method: Circle system utilized Preoxygenation: Pre-oxygenation with 100% oxygen Induction Type: IV induction Ventilation: Mask ventilation without difficulty LMA: LMA inserted LMA Size: 4.0 Tube type: Oral Number of attempts: 1 Placement Confirmation: positive ETCO2 and breath sounds checked- equal and bilateral Tube secured with: Tape Dental Injury: Teeth and Oropharynx as per pre-operative assessment  Comments: atraumatic

## 2024-03-24 NOTE — Anesthesia Preprocedure Evaluation (Addendum)
 Anesthesia Evaluation  Patient identified by MRN, date of birth, ID band Patient awake    Reviewed: Allergy & Precautions, NPO status , Patient's Chart, lab work & pertinent test results  Airway Mallampati: II  TM Distance: >3 FB Neck ROM: Full    Dental  (+) Partial Lower, Partial Upper, Missing, Dental Advisory Given   Pulmonary asthma , Current Smoker and Patient abstained from smoking. 1/2 ppd, smoked this AM Inhalers- once a month    Pulmonary exam normal breath sounds clear to auscultation       Cardiovascular negative cardio ROS Normal cardiovascular exam Rhythm:Regular Rate:Normal     Neuro/Psych  PSYCHIATRIC DISORDERS Anxiety Depression    negative neurological ROS     GI/Hepatic negative GI ROS, Neg liver ROS,,,  Endo/Other  diabetes, Well Controlled, Type 2    Renal/GU negative Renal ROS  negative genitourinary   Musculoskeletal negative musculoskeletal ROS (+)    Abdominal   Peds  Hematology negative hematology ROS (+)   Anesthesia Other Findings   Reproductive/Obstetrics Fibroids Urine preg neg                              Anesthesia Physical Anesthesia Plan  ASA: 3  Anesthesia Plan: General   Post-op Pain Management: Tylenol  PO (pre-op)* and Toradol  IV (intra-op)*   Induction: Intravenous  PONV Risk Score and Plan: 2 and Ondansetron , Dexamethasone , Midazolam  and Treatment may vary due to age or medical condition  Airway Management Planned: LMA  Additional Equipment: None  Intra-op Plan:   Post-operative Plan: Extubation in OR  Informed Consent: I have reviewed the patients History and Physical, chart, labs and discussed the procedure including the risks, benefits and alternatives for the proposed anesthesia with the patient or authorized representative who has indicated his/her understanding and acceptance.     Dental advisory given  Plan Discussed with:  CRNA  Anesthesia Plan Comments:          Anesthesia Quick Evaluation

## 2024-03-24 NOTE — Transfer of Care (Signed)
 Immediate Anesthesia Transfer of Care Note  Patient: Brandi Cooke  Procedure(s) Performed: DILATATION & CURETTAGE/HYSTEROSCOPY (Uterus) MYOSURE RESECTION (Uterus)  Patient Location: PACU  Anesthesia Type:General  Level of Consciousness: drowsy  Airway & Oxygen Therapy: Patient Spontanous Breathing and Patient connected to face mask oxygen  Post-op Assessment: Report given to RN and Post -op Vital signs reviewed and stable  Post vital signs: Reviewed and stable  Last Vitals:  Vitals Value Taken Time  BP 149/98 03/24/24 15:51  Temp    Pulse 56 03/24/24 15:53  Resp 17 03/24/24 15:53  SpO2 100 % 03/24/24 15:53  Vitals shown include unfiled device data.  Last Pain:  Vitals:   03/24/24 1353  TempSrc: Oral  PainSc: 0-No pain      Patients Stated Pain Goal: 5 (03/24/24 1353)  Complications: No notable events documented.

## 2024-03-24 NOTE — Discharge Instructions (Signed)

## 2024-03-24 NOTE — Op Note (Signed)
 03/24/24  Brandi Cooke [id number]  OPERATIVE REPORT  Preop Diagnosis: Pre-Op Diagnosis Codes:     * Endometrial thickening on ultrasound [R93.89]    * Fibroids, submucosal [D25.0]   Post operative diagnosis: same  Procedure: Procedure(s): DILATATION & CURETTAGE/HYSTEROSCOPY, myomectomy and polypectomy via MYOSURE RESECTION   Surgeon: Vera LULLA Pa, MD Assistant: none  Anesthesia: MAC Fluids: please see anesthesia report Fluid deficit: 325cc  Complications: None  Findings: Stenotic cervix. Uterine cavity measuring 10cm with endometrial polyp and submucosal fibroid. Both ostia seen.  Estimated blood loss: Minimal  Specimens:  ID Type Source Tests Collected by Time Destination  1 : endometrial polyp and fibroid Tissue PATH Gyn biopsy SURGICAL PATHOLOGY Pa Vera LULLA, MD 03/24/2024 1534   2 : endometrial curettings Tissue PATH Gyn biopsy SURGICAL PATHOLOGY Pa Vera LULLA, MD 03/24/2024 1534     Disposition of specimen: Pathology    Procedure: Patient was taken to the OR where she was placed in dorsal lithotomy in stirrups. She voided prior to transport. SCDs were in place.  The patient was prepped and draped in the usual sterile fashion. An adequate timeout was obtained and everyone agreed. A bivalve speculum was placed inside the vagina and the cervix visualized. The cervix was grasped anteriorly with a single-tooth tenaculum. Paracervical block was performed with 1% lidocaine . Cervix was dilated using the os finder. The uterus sounded to 10 cm. Sequential cervical dilation was done to 34fr, and the myosure hysteroscope was introduced into the uterine cavity. The cervix and endometrial lining appeared normal both ostia were seen. Findings as noted. Submucosal fibroid and endometrial polyp was seen and removed using the myosure.  The myosure was also used to sample the entire cavity.  A sharp curettage was then performed to ensure complete sampling of the cavity and was  sent to pathology. All instrument, sponge and lap counts were correct x2. The patient was awakened taken to recovery room in stable condition.  Vera LULLA Pa, MD 03/24/24  3:47 PM

## 2024-03-24 NOTE — Interval H&P Note (Signed)
 Date of Initial H&P: 03/17/24  History reviewed, patient examined, no change in status, stable for surgery.

## 2024-03-24 NOTE — Anesthesia Postprocedure Evaluation (Signed)
 Anesthesia Post Note  Patient: FLORENCIA ZACCARO  Procedure(s) Performed: DILATATION & CURETTAGE/HYSTEROSCOPY (Uterus) MYOSURE RESECTION (Uterus)     Patient location during evaluation: PACU Anesthesia Type: General Level of consciousness: awake and alert, oriented and patient cooperative Pain management: pain level controlled Vital Signs Assessment: post-procedure vital signs reviewed and stable Respiratory status: spontaneous breathing, nonlabored ventilation and respiratory function stable Cardiovascular status: blood pressure returned to baseline and stable Postop Assessment: no apparent nausea or vomiting Anesthetic complications: no   No notable events documented.  Last Vitals:  Vitals:   03/24/24 1600 03/24/24 1630  BP: (!) 147/98 (!) 134/103  Pulse: (!) 59 (!) 59  Resp: 19 19  Temp:    SpO2: 100% 97%    Last Pain:  Vitals:   03/24/24 1641  TempSrc:   PainSc: 4                  Almarie CHRISTELLA Marchi

## 2024-03-25 ENCOUNTER — Encounter (HOSPITAL_COMMUNITY): Payer: Self-pay | Admitting: Obstetrics and Gynecology

## 2024-03-26 ENCOUNTER — Ambulatory Visit: Payer: Self-pay | Admitting: Obstetrics and Gynecology

## 2024-03-26 LAB — SURGICAL PATHOLOGY

## 2024-04-01 ENCOUNTER — Ambulatory Visit: Admitting: Family Medicine

## 2024-04-01 VITALS — BP 122/68 | HR 86 | Temp 98.1°F | Ht 67.0 in | Wt 165.4 lb

## 2024-04-01 DIAGNOSIS — E1169 Type 2 diabetes mellitus with other specified complication: Secondary | ICD-10-CM

## 2024-04-01 DIAGNOSIS — E1165 Type 2 diabetes mellitus with hyperglycemia: Secondary | ICD-10-CM | POA: Diagnosis not present

## 2024-04-01 DIAGNOSIS — F17210 Nicotine dependence, cigarettes, uncomplicated: Secondary | ICD-10-CM | POA: Diagnosis not present

## 2024-04-01 DIAGNOSIS — F418 Other specified anxiety disorders: Secondary | ICD-10-CM | POA: Diagnosis not present

## 2024-04-01 DIAGNOSIS — E785 Hyperlipidemia, unspecified: Secondary | ICD-10-CM

## 2024-04-01 MED ORDER — ATORVASTATIN CALCIUM 10 MG PO TABS: 10.0000 mg | ORAL_TABLET | Freq: Every day | ORAL | 1 refills | Status: AC

## 2024-04-01 MED ORDER — SERTRALINE HCL 25 MG PO TABS: 25.0000 mg | ORAL_TABLET | Freq: Every day | ORAL | 1 refills | Status: AC

## 2024-04-01 NOTE — Progress Notes (Unsigned)
 Subjective:  Patient ID: Brandi Cooke, female    DOB: 11/24/1965  Age: 58 y.o. MRN: [id number]  CC:  Chief Complaint  Patient presents with   Medical Management of Chronic Issues    Pt is doing okay, would like to discuss lowing dose on anxiety medications notes causes some headaches,     HPI Brandi Cooke presents for   Diabetes: Complicated by hyperglycemia.  Intolerant to metformin  due to GI side effects and previously Jardiance  was cost prohibitive.  Had improved diet and weight had also improved with her efforts previously. Statins have been discussed given diabetes and ASCVD risk score, initially Lipitor was ordered and not taken but then did take it 2 times per week without side effects at her May visit.  Repeat labs with LDL still elevated at 135, recommended increased frequency.  Currently taking statin -  4 days per week. No side effects.  Recent A1c noted.  Microalbumin: Normal ratio 03/22/2023, repeat today. Declines flu vaccine today.   Lab Results  Component Value Date   HGBA1C 7.2 (H) 03/24/2024   HGBA1C 7.3 (H) 12/26/2023   HGBA1C 6.9 (H) 09/26/2023   Lab Results  Component Value Date   LDLCALC 135 (H) 12/26/2023   CREATININE 0.84 03/24/2024   Anxiety Discussed in May, sertraline  3-4 times per week at that time without side effects, had forgotten to take at times.  Recommended use of pill minder for consistent dosing, and improved anxiety control.  Option of higher dosing but consistent dosing recommended initially.   Did not increase to daily dosing. Stopped sertraline  temporarily - took one last week.  Denies SI/HI.  Still feeling anxious. Declines psychiatry referral at this time.  Tearful during visit today.  Harder this month. Mom passed away on 2024-04-22 11 years ago. This time of year is tough. Other stressors. Has talked with friends. Involved with church.  Stressed importance of counseling - declines for now but will provide number.        04/01/2024   10:53 AM 12/26/2023   10:40 AM 09/26/2023   11:54 AM 06/20/2023    1:37 PM  GAD 7 : Generalized Anxiety Score  Nervous, Anxious, on Edge 3 2 1 1   Control/stop worrying 3 2 3  0  Worry too much - different things 3 2 3  0  Trouble relaxing 2 2 0 1  Restless 3 1 2  0  Easily annoyed or irritable 3 2 2 1   Afraid - awful might happen 2 0 0 1  Total GAD 7 Score 19 11 11 4   Anxiety Difficulty Somewhat difficult Somewhat difficult Not difficult at all    Nicotine addiction Discussed in May, 1/2 to 1 pack/day for 40 years, half pack per day on average.  History of asthma, bronchitis with albuterol  infrequently.  We have discussed quitting smoking previously as well as provided information, additional information, handouts were declined at her May visit.  Still smoking half pack per day on average. Not ready to quit at this time.    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   Fibroid    Bell's palsy    Glaucoma    Past Medical History:  Diagnosis Date   Anemia  Anxiety    Asthma 11/2023   followed by pcp   Depression    Endometrial thickening on ultrasound    Glaucoma, left eye    s/p  left eye glaucoma valve insertion 07-26-2022   H/O Bell's palsy    03-23-2024  pt stated approx 2009 ,  left side ,  stated intermittantly now face may droop on left side , happens more when needing rest   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 Cornett, MD;  Location: [facility];  Service: General;  Laterality: Left;   COLONOSCOPY  10/05/2022   dr eda   DILATATION & CURRETTAGE/HYSTEROSCOPY WITH RESECTOCOPE N/A 03/24/2024   Procedure: DILATATION & CURETTAGE/HYSTEROSCOPY;  Surgeon: Dallie Vera GAILS, MD;  Location: [facility] OR;  Service: Gynecology;  Laterality: N/A;  MYOSURE   ENDOSCOPIC MUCOSAL RESECTION N/A 01/08/2023   Procedure: ENDOSCOPIC MUCOSAL RESECTION;  Surgeon: Wilhelmenia Aloha Raddle., MD;  Location: [facility];  Service: Gastroenterology;  Laterality: N/A;   EUS N/A 01/08/2023   Procedure: LOWER ENDOSCOPIC ULTRASOUND (EUS);  Surgeon: Wilhelmenia Aloha Raddle., MD;  Location: [facility];  Service: Gastroenterology;  Laterality: N/A;   EUS N/A 01/16/2024   Procedure: ULTRASOUND, LOWER GI TRACT, ENDOSCOPIC;  Surgeon: Wilhelmenia Aloha Raddle., MD;  Location: [facility];  Service: Gastroenterology;  Laterality: N/A;   EXPLORATORY LAPAROTOMY  11/09/2005   @[facility]  by dr r. fontaine;   Myomectomy   FLEXIBLE SIGMOIDOSCOPY N/A 01/08/2023   Procedure: ENID MORIN;  Surgeon: Wilhelmenia Aloha Raddle., MD;  Location: [facility];  Service: Gastroenterology;  Laterality: N/A;   FLEXIBLE SIGMOIDOSCOPY N/A 01/16/2024   Procedure: MORIN ENID;  Surgeon: Wilhelmenia Aloha Raddle., MD;  Location: [facility];  Service: Gastroenterology;  Laterality: N/A;   GLAUCOMA VALVE INSERTION Left 07/26/2022   HEMOSTASIS CLIP PLACEMENT  01/08/2023   Procedure: HEMOSTASIS CLIP PLACEMENT;  Surgeon: Wilhelmenia Aloha Raddle., MD;  Location: [facility];  Service:  Gastroenterology;;   HYSTEROSCOPY WITH MYOMECTOMY  01/02/2001   @[facility]  by dr t. rockney NAIL RESECTION  03/24/2024   Procedure: NAIL RESECTION;  Surgeon: Dallie Vera GAILS, MD;  Location: [facility] OR;  Service: Gynecology;;   POLYPECTOMY  01/16/2024   Procedure: POLYPECTOMY, INTESTINE;  Surgeon: Wilhelmenia Aloha Raddle., MD;  Location: [facility];  Service: Gastroenterology;;   SUBMUCOSAL LIFTING INJECTION  01/08/2023   Procedure: SUBMUCOSAL LIFTING INJECTION;  Surgeon: Wilhelmenia Aloha Raddle., MD;  Location: [facility];  Service: Gastroenterology;;   Allergies  Allergen Reactions   Latex Itching and Rash   Metformin  And Related Other (See Comments)    Diarrhea    Prior to Admission medications   Medication Sig Start Date End Date Taking? Authorizing Provider  ACCU-CHEK GUIDE test strip TEST ONCE DAILY 02/04/23  Yes Levora Reyes SAUNDERS, MD  Accu-Chek Softclix Lancets lancets TEST ONCE  DAILY 03/14/23  Yes Levora Reyes SAUNDERS, MD  acetaminophen  (TYLENOL ) 500 MG tablet Take 1,000 mg by mouth every 6 (six) hours as needed.   Yes [provider]  albuterol  (VENTOLIN  HFA) 108 (90 Base) MCG/ACT inhaler Inhale 2 puffs into the lungs every 6 (six) hours as needed (bronchitis/Asthma). 12/26/23  Yes Levora Reyes SAUNDERS, MD  atorvastatin  (LIPITOR) 10 MG tablet Take 1 tablet (10 mg total) by mouth daily. Patient taking differently: Take 10 mg by mouth at bedtime. 12/26/23  Yes Levora Reyes SAUNDERS, MD  Biotin w/ Vitamins C & E (HAIR SKIN & NAILS GUMMIES PO) Take 2 Pieces of gum by mouth daily.   Yes [provider]  Blood Glucose Monitoring Suppl DEVI 1 each by Does not apply route daily. May substitute to any manufacturer covered by patient's insurance. 11/08/22  Yes Levora Reyes SAUNDERS, MD  Carboxymethylcellul-Glycerin (CLEAR EYES FOR DRY EYES OP) Apply to eye as needed.   Yes [provider]  ibuprofen  (ADVIL ) 200 MG tablet Take 200-600 mg by mouth every 6 (six) hours as needed.   Yes  [provider]  Multiple Vitamin (MULTIVITAMIN WITH MINERALS) TABS tablet Take 1 tablet by mouth daily.   Yes [provider]  prednisoLONE acetate (PRED FORTE) 1 % ophthalmic suspension Place 1 drop into the left eye daily. 11/09/22  Yes [provider]  sertraline  (ZOLOFT ) 25 MG tablet Take 1 tablet (25 mg total) by mouth daily. Patient taking differently: Take 25 mg by mouth daily. 09/26/23  Yes Levora Reyes SAUNDERS, MD  cyclobenzaprine  (FLEXERIL ) 10 MG tablet Take 1 tablet (10 mg total) by mouth 2 (two) times daily as needed for up to 14 doses for muscle spasms. Patient not taking: Reported on 04/01/2024 10/12/22   Cottie Donnice PARAS, MD   Social History   Socioeconomic History   Marital status: Single    Spouse name: Not on file   Number of children: Not on file   Years of education: Not on file   Highest education level: Not on file  Occupational History   Not on file  Tobacco Use   Smoking status: Every Day    Current packs/day: 0.50    Types: Cigarettes   Smokeless tobacco: Never   Tobacco comments:    03-23-2024  pt stated smokes 1 ppd ,  started age 89  Vaping Use   Vaping status: Never Used  Substance and Sexual Activity   Alcohol use: Not Currently    Comment: occasional   Drug use: Never   Sexual activity: Not Currently    Birth control/protection: Post-menopausal    Comment: 1st intercourse 58 yo-Fewer than 5 partners  Other Topics Concern   Not on file  Social History Narrative   Not on file   Social Drivers of Health   Financial Resource Strain: Not on file  Food Insecurity: No Food Insecurity (09/18/2022)   Hunger Vital Sign    Worried About Running Out of Food in the Last Year: Never true    Ran Out of Food in the Last Year: Never true  Transportation Needs: No Transportation Needs (09/18/2022)   PRAPARE - Administrator, Civil Service (Medical): No    Lack of Transportation (Non-Medical): No  Physical Activity: Not on  file  Stress: Not on file  Social Connections: Not on file  Intimate Partner Violence: Not on file    Review of Systems  Constitutional:  Negative for fatigue and unexpected weight change.  Respiratory:  Negative for chest tightness and shortness of breath.   Cardiovascular:  Negative for chest pain, palpitations and leg swelling.  Gastrointestinal:  Negative for abdominal pain and blood in stool.  Neurological:  Negative for dizziness, syncope, light-headedness and headaches.     Objective:   Vitals:   04/01/24 1049  BP: 122/68  Pulse: 86  Temp: 98.1 F (36.7 C)  TempSrc: Temporal  SpO2: 98%  Weight: 165 lb 6.4 oz (75 kg)  Height: 5' 7 (1.702 m)     Physical Exam Vitals reviewed.  Constitutional:      Appearance: Normal appearance. She is well-developed.  HENT:     Head: Normocephalic and atraumatic.  Eyes:     Conjunctiva/sclera: Conjunctivae normal.     Pupils: Pupils are equal, round, and reactive to light.  Neck:     Vascular: No carotid bruit.  Cardiovascular:     Rate and Rhythm: Normal rate and regular rhythm.     Heart sounds: Normal heart sounds.  Pulmonary:     Effort: Pulmonary effort is normal.     Breath sounds: Normal breath sounds.  Abdominal:     Palpations: Abdomen is soft. There is no pulsatile mass.     Tenderness: There is no abdominal tenderness.  Musculoskeletal:     Right lower leg: No edema.     Left lower leg: No edema.  Skin:    General: Skin is warm and dry.  Neurological:     Mental Status: She is alert and oriented to person, place, and time.  Psychiatric:        Behavior: Behavior normal.     Comments: Tearful, anxious appearing.  Some psychomotor agitation.  Does not appear to be responding to internal stimuli.  No SI/HI.     Assessment & Plan:  Brandi Cooke is a 58 y.o. female . Type 2 diabetes mellitus with hyperglycemia, without long-term current use of insulin (HCC) - Plan: Microalbumin / creatinine urine  ratio  Anxiety with depression - Plan: sertraline  (ZOLOFT ) 25 MG tablet  Cigarette nicotine dependence without complication  Hyperlipidemia associated with type 2 diabetes mellitus (HCC) - Plan: atorvastatin  (LIPITOR) 10 MG tablet   Meds ordered this encounter  Medications   sertraline  (ZOLOFT ) 25 MG tablet    Sig: Take 1 tablet (25 mg total) by mouth daily.    Dispense:  90 tablet    Refill:  1   atorvastatin  (LIPITOR) 10 MG tablet    Sig: Take 1 tablet (10 mg total) by mouth at bedtime.    Dispense:  90 tablet    Refill:  1   Patient Instructions  Thank you for coming in today.  As we discussed I am concerned about your treatment of anxiety and that is not as well treated at this time as it could be.  It is very important that you take the sertraline  every day for it to be effective and that can take a few weeks to work.  See information below on management of anxiety at home.  Try to find some calming activities today.  I can understand that this is a stressful time.  I do recommend meeting with a therapist as that will help treat anxiety as well and may lessen the need for some medication.  I have provided some numbers below for you to call.  I will follow-up with you in 1 month and we can see how things are going at that time.  It is very important that  you take the cholesterol medication every night, use the pillbox/pill minder if needed.  Cholesterol level was still too high at May visit.  Let me know when you are ready to quit smoking and I am happy to help you.  Hang in there!  Here are a few options for counseling:  [facility]:  [phone]  [facility]:  [phone]  [facility] [phone]   Managing Anxiety, Adult After being diagnosed with anxiety, you may be relieved to know why you have felt or behaved a certain way. You may also feel overwhelmed about the treatment ahead and what it will mean for  your life. With care and support, you can manage your anxiety. How to manage lifestyle changes Understanding the difference between stress and anxiety Although stress can play a role in anxiety, it is not the same as anxiety. Stress is your body's reaction to life changes and events, both good and bad. Stress is often caused by something external, such as a deadline, test, or competition. It normally goes away after the event has ended and will last just a few hours. But, stress can be ongoing and can lead to more than just stress. Anxiety is caused by something internal, such as imagining a terrible outcome or worrying that something will go wrong that will greatly upset you. Anxiety often does not go away even after the event is over, and it can become a long-term (chronic) worry. Lowering stress and anxiety Talk with your health care provider or a counselor to learn more about lowering anxiety and stress. They may suggest tension-reduction techniques, such as: Music. Spend time creating or listening to music that you enjoy and that inspires you. Mindfulness-based meditation. Practice being aware of your normal breaths while not trying to control your breathing. It can be done while sitting or walking. Centering prayer. Focus on a word, phrase, or sacred image that means something to you and brings you peace. Deep breathing. Expand your stomach and inhale slowly through your nose. Hold your breath for 3-5 seconds. Then breathe out slowly, letting your stomach muscles relax. Self-talk. Learn to notice and spot thought patterns that lead to anxiety reactions. Change those patterns to thoughts that feel peaceful. Muscle relaxation. Take time to tense muscles and then relax them. Choose a tension-reduction technique that fits your lifestyle and personality. These techniques take time and practice. Set aside 5-15 minutes a day to do them. Specialized therapists can offer counseling and training in these  techniques. The training to help with anxiety may be covered by some insurance plans. Other things you can do to manage stress and anxiety include: Keeping a stress diary. This can help you learn what triggers your reaction and then learn ways to manage your response. Thinking about how you react to certain situations. You may not be able to control everything, but you can control your response. Making time for activities that help you relax and not feeling guilty about spending your time in this way. Doing visual imagery. This involves imagining or creating mental pictures to help you relax. Practicing yoga. Through yoga poses, you can lower tension and relax.  Medicines Medicines for anxiety include: Antidepressant medicines. These are usually prescribed for long-term daily control. Anti-anxiety medicines. These may be added in severe cases, especially when panic attacks occur. When used together, medicines, psychotherapy, and tension-reduction techniques may be the most effective treatment. Relationships Relationships can play a big part in helping you recover. Spend more time  connecting with trusted friends and family members. Think about going to couples counseling if you have a partner, taking family education classes, or going to family therapy. Therapy can help you and others better understand your anxiety. How to recognize changes in your anxiety Everyone responds differently to treatment for anxiety. Recovery from anxiety happens when symptoms lessen and stop interfering with your daily life at home or work. This may mean that you will start to: Have better concentration and focus. Worry will interfere less in your daily thinking. Sleep better. Be less irritable. Have more energy. Have improved memory. Try to recognize when your condition is getting worse. Contact your provider if your symptoms interfere with home or work and you feel like your condition is not improving. Follow  these instructions at home: Activity Exercise. Adults should: Exercise for at least 150 minutes each week. The exercise should increase your heart rate and make you sweat (moderate-intensity exercise). Do strengthening exercises at least twice a week. Get the right amount and quality of sleep. Most adults need 7-9 hours of sleep each night. Lifestyle  Eat a healthy diet that includes plenty of vegetables, fruits, whole grains, low-fat dairy products, and lean protein. Do not eat a lot of foods that are high in fats, added sugars, or salt (sodium). Make choices that simplify your life. Do not use any products that contain nicotine or tobacco. These products include cigarettes, chewing tobacco, and vaping devices, such as e-cigarettes. If you need help quitting, ask your provider. Avoid caffeine, alcohol, and certain over-the-counter cold medicines. These may make you feel worse. Ask your pharmacist which medicines to avoid. General instructions Take over-the-counter and prescription medicines only as told by your provider. Keep all follow-up visits. This is to make sure you are managing your anxiety well or if you need more support. Where to find support You can get help and support from: Self-help groups. Online and [employer]. A trusted spiritual leader. Couples counseling. Family education classes. Family therapy. Where to find more information You may find that joining a support group helps you deal with your anxiety. The following sources can help you find counselors or support groups near you: Mental Health America: mentalhealthamerica.net Anxiety and Depression Association of [place] (ADAA): adaa.org The [employer] on Mental Illness (NAMI): nami.org Contact a health care provider if: You have a hard time staying focused or finishing tasks. You spend many hours a day feeling worried about everyday life. You are very tired because you cannot stop worrying. You  start to have headaches or often feel tense. You have chronic nausea or diarrhea. Get help right away if: Your heart feels like it is racing. You have shortness of breath. You have thoughts of hurting yourself or others. Get help right away if you feel like you may hurt yourself or others, or have thoughts about taking your own life. Go to your nearest emergency room or: Call 911. Call the National Suicide Prevention Lifeline at (519)279-0304 or 988. This is open 24 hours a day. Text the Crisis Text Line at (404) 242-3089. This information is not intended to replace advice given to you by your health care provider. Make sure you discuss any questions you have with your health care provider. Document Revised: 04/24/2022 Document Reviewed: 11/06/2020 Elsevier Patient Education  2024 [employer].    Signed,   Reyes Pines, MD [facility], [facility] 04/01/24 11:35 AM

## 2024-04-01 NOTE — Patient Instructions (Signed)
 Thank you for coming in today.  As we discussed I am concerned about your treatment of anxiety and that is not as well treated at this time as it could be.  It is very important that you take the sertraline  every day for it to be effective and that can take a few weeks to work.  See information below on management of anxiety at home.  Try to find some calming activities today.  I can understand that this is a stressful time.  I do recommend meeting with a therapist as that will help treat anxiety as well and may lessen the need for some medication.  I have provided some numbers below for you to call.  I will follow-up with you in 1 month and we can see how things are going at that time.  It is very important that you take the cholesterol medication every night, use the pillbox/pill minder if needed.  Cholesterol level was still too high at May visit.  Let me know when you are ready to quit smoking and I am happy to help you.  Hang in there!  Here are a few options for counseling:  [facility]:  [phone]  [facility]:  [phone]  [facility] [phone]   Managing Anxiety, Adult After being diagnosed with anxiety, you may be relieved to know why you have felt or behaved a certain way. You may also feel overwhelmed about the treatment ahead and what it will mean for your life. With care and support, you can manage your anxiety. How to manage lifestyle changes Understanding the difference between stress and anxiety Although stress can play a role in anxiety, it is not the same as anxiety. Stress is your body's reaction to life changes and events, both good and bad. Stress is often caused by something external, such as a deadline, test, or competition. It normally goes away after the event has ended and will last just a few hours. But, stress can be ongoing and can lead to more than just stress. Anxiety is caused by something  internal, such as imagining a terrible outcome or worrying that something will go wrong that will greatly upset you. Anxiety often does not go away even after the event is over, and it can become a long-term (chronic) worry. Lowering stress and anxiety Talk with your health care provider or a counselor to learn more about lowering anxiety and stress. They may suggest tension-reduction techniques, such as: Music. Spend time creating or listening to music that you enjoy and that inspires you. Mindfulness-based meditation. Practice being aware of your normal breaths while not trying to control your breathing. It can be done while sitting or walking. Centering prayer. Focus on a word, phrase, or sacred image that means something to you and brings you peace. Deep breathing. Expand your stomach and inhale slowly through your nose. Hold your breath for 3-5 seconds. Then breathe out slowly, letting your stomach muscles relax. Self-talk. Learn to notice and spot thought patterns that lead to anxiety reactions. Change those patterns to thoughts that feel peaceful. Muscle relaxation. Take time to tense muscles and then relax them. Choose a tension-reduction technique that fits your lifestyle and personality. These techniques take time and practice. Set aside 5-15 minutes a day to do them. Specialized therapists can offer counseling and training in these techniques. The training to help with anxiety may be covered by some insurance plans. Other things you can do to manage stress and anxiety  include: Keeping a stress diary. This can help you learn what triggers your reaction and then learn ways to manage your response. Thinking about how you react to certain situations. You may not be able to control everything, but you can control your response. Making time for activities that help you relax and not feeling guilty about spending your time in this way. Doing visual imagery. This involves imagining or creating  mental pictures to help you relax. Practicing yoga. Through yoga poses, you can lower tension and relax.  Medicines Medicines for anxiety include: Antidepressant medicines. These are usually prescribed for long-term daily control. Anti-anxiety medicines. These may be added in severe cases, especially when panic attacks occur. When used together, medicines, psychotherapy, and tension-reduction techniques may be the most effective treatment. Relationships Relationships can play a big part in helping you recover. Spend more time connecting with trusted friends and family members. Think about going to couples counseling if you have a partner, taking family education classes, or going to family therapy. Therapy can help you and others better understand your anxiety. How to recognize changes in your anxiety Everyone responds differently to treatment for anxiety. Recovery from anxiety happens when symptoms lessen and stop interfering with your daily life at home or work. This may mean that you will start to: Have better concentration and focus. Worry will interfere less in your daily thinking. Sleep better. Be less irritable. Have more energy. Have improved memory. Try to recognize when your condition is getting worse. Contact your provider if your symptoms interfere with home or work and you feel like your condition is not improving. Follow these instructions at home: Activity Exercise. Adults should: Exercise for at least 150 minutes each week. The exercise should increase your heart rate and make you sweat (moderate-intensity exercise). Do strengthening exercises at least twice a week. Get the right amount and quality of sleep. Most adults need 7-9 hours of sleep each night. Lifestyle  Eat a healthy diet that includes plenty of vegetables, fruits, whole grains, low-fat dairy products, and lean protein. Do not eat a lot of foods that are high in fats, added sugars, or salt (sodium). Make  choices that simplify your life. Do not use any products that contain nicotine or tobacco. These products include cigarettes, chewing tobacco, and vaping devices, such as e-cigarettes. If you need help quitting, ask your provider. Avoid caffeine, alcohol, and certain over-the-counter cold medicines. These may make you feel worse. Ask your pharmacist which medicines to avoid. General instructions Take over-the-counter and prescription medicines only as told by your provider. Keep all follow-up visits. This is to make sure you are managing your anxiety well or if you need more support. Where to find support You can get help and support from: Self-help groups. Online and [employer]. A trusted spiritual leader. Couples counseling. Family education classes. Family therapy. Where to find more information You may find that joining a support group helps you deal with your anxiety. The following sources can help you find counselors or support groups near you: Mental Health America: mentalhealthamerica.net Anxiety and Depression Association of [place] (ADAA): adaa.org The [employer] on Mental Illness (NAMI): nami.org Contact a health care provider if: You have a hard time staying focused or finishing tasks. You spend many hours a day feeling worried about everyday life. You are very tired because you cannot stop worrying. You start to have headaches or often feel tense. You have chronic nausea or diarrhea. Get help right away if: Your heart feels like  it is racing. You have shortness of breath. You have thoughts of hurting yourself or others. Get help right away if you feel like you may hurt yourself or others, or have thoughts about taking your own life. Go to your nearest emergency room or: Call 911. Call the National Suicide Prevention Lifeline at 562-300-7797 or 988. This is open 24 hours a day. Text the Crisis Text Line at 432-115-3992. This information is not intended to  replace advice given to you by your health care provider. Make sure you discuss any questions you have with your health care provider. Document Revised: 04/24/2022 Document Reviewed: 11/06/2020 Elsevier Patient Education  2024 [employer].

## 2024-04-02 LAB — MICROALBUMIN / CREATININE URINE RATIO
Creatinine,U: 172.2 mg/dL
Microalb Creat Ratio: 21.5 mg/g (ref 0.0–30.0)
Microalb, Ur: 3.7 mg/dL — ABNORMAL HIGH (ref 0.0–1.9)

## 2024-04-03 ENCOUNTER — Encounter: Payer: Self-pay | Admitting: Family Medicine

## 2024-04-03 ENCOUNTER — Ambulatory Visit: Payer: Self-pay | Admitting: Family Medicine

## 2024-04-06 ENCOUNTER — Ambulatory Visit (INDEPENDENT_AMBULATORY_CARE_PROVIDER_SITE_OTHER): Admitting: Obstetrics and Gynecology

## 2024-04-06 ENCOUNTER — Encounter: Payer: Self-pay | Admitting: Obstetrics and Gynecology

## 2024-04-06 VITALS — BP 122/80 | HR 92 | Temp 98.2°F | Wt 165.0 lb

## 2024-04-06 DIAGNOSIS — D250 Submucous leiomyoma of uterus: Secondary | ICD-10-CM

## 2024-04-06 DIAGNOSIS — R9389 Abnormal findings on diagnostic imaging of other specified body structures: Secondary | ICD-10-CM

## 2024-04-06 NOTE — Progress Notes (Signed)
 58 y.o. G1P1001 postmenopausal female with endometrial mass, known fibroids, type 2 diabetes, tobacco use, asthma, neuroendocrine tumor of the colon (dx 2024) here for postop exam for diagnostic hysteroscopy, dilation and curettage, polypectomy, myomectomy. Single. Cleaner in the evenings. Referred by Dr. Nikki for surgical management.  Pt reports some spotting after surgery. Has now stopped. No other concerns today Denies N/V, abdominal pain, VB, dysuria. Normal BM and voids.   03/24/24 path: A. ENDOMETRIUM, POLYP AND FIBROID:       Benign endometrial polyp.       Background inactive endometrium.       Fragments of smooth muscle suggestive of leiomyoma.       Negative for hyperplasia, atypia or malignancy.   B. ENDOMETRIUM, CURETTAGE:       Benign inactive endometrium.       Fragments of smooth muscle suggestive of leiomyoma.       Negative for hyperplasia, atypia or malignancy.   Last PAP:    Component Value Date/Time   DIAGPAP  08/02/2022 0824    - Negative for intraepithelial lesion or malignancy (NILM)   HPVHIGH Negative 08/02/2022 0824   ADEQPAP  08/02/2022 0824    Satisfactory for evaluation; transformation zone component PRESENT.   GYN HISTORY: Fibroids, prior myomectomy Vaginal atrophy Hx left breast biopsy  OB History  Gravida Para Term Preterm AB Living  1 1 1   1   SAB IAB Ectopic Multiple Live Births      1    # Outcome Date GA Lbr Len/2nd Weight Sex Type Anes PTL Lv  1 Term      Vag-Spont   LIV    Past Medical History:  Diagnosis Date   Anemia    Anxiety    Asthma 11/2023   followed by pcp   Depression    Endometrial thickening on ultrasound    Glaucoma, left eye    s/p  left eye glaucoma valve insertion 07-26-2022   H/O Bell's palsy    03-23-2024  pt stated approx 2009 ,  left side ,  stated intermittantly now face may droop on left side , happens more when needing rest   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 Cornett, MD;  Location: [facility];  Service: General;  Laterality: Left;   COLONOSCOPY  10/05/2022   dr eda   DILATATION & CURRETTAGE/HYSTEROSCOPY WITH RESECTOCOPE N/A 03/24/2024   Procedure: DILATATION & CURETTAGE/HYSTEROSCOPY;  Surgeon: Dallie Vera GAILS, MD;  [facility];  Service: Gynecology;  Laterality: N/A;  MYOSURE   ENDOSCOPIC MUCOSAL RESECTION N/A 01/08/2023   Procedure: ENDOSCOPIC MUCOSAL RESECTION;  Surgeon: Wilhelmenia Aloha Raddle., MD;  Location: [facility];  Service: Gastroenterology;  Laterality: N/A;   EUS N/A 01/08/2023   Procedure: LOWER ENDOSCOPIC ULTRASOUND (EUS);  Surgeon:  Mansouraty, Aloha Raddle., MD;  Location: [facility];  Service: Gastroenterology;  Laterality: N/A;   EUS N/A 01/16/2024   Procedure: ULTRASOUND, LOWER GI TRACT, ENDOSCOPIC;  Surgeon: Wilhelmenia Aloha Raddle., MD;  Location: [facility];  Service: Gastroenterology;  Laterality: N/A;   EXPLORATORY LAPAROTOMY  11/09/2005   @[facility]  by dr r. fontaine;   Myomectomy   FLEXIBLE SIGMOIDOSCOPY N/A 01/08/2023   Procedure: ENID MORIN;  Surgeon: Wilhelmenia Aloha Raddle., MD;  Location: [facility];  Service: Gastroenterology;  Laterality: N/A;   FLEXIBLE SIGMOIDOSCOPY N/A 01/16/2024   Procedure: MORIN ENID;  Surgeon: Wilhelmenia Aloha Raddle., MD;  Location: [facility];  Service:  Gastroenterology;  Laterality: N/A;   GLAUCOMA VALVE INSERTION Left 07/26/2022   HEMOSTASIS CLIP PLACEMENT  01/08/2023   Procedure: HEMOSTASIS CLIP PLACEMENT;  Surgeon: Wilhelmenia Aloha Raddle., MD;  Location: [facility];  Service: Gastroenterology;;   HYSTEROSCOPY WITH MYOMECTOMY  01/02/2001   @[facility]  by dr t. rockney NAIL RESECTION  03/24/2024   Procedure: NAIL RESECTION;  Surgeon: Dallie Vera GAILS, MD;  Location: [facility] OR;  Service: Gynecology;;   POLYPECTOMY  01/16/2024   Procedure: POLYPECTOMY, INTESTINE;  Surgeon: Wilhelmenia Aloha Raddle., MD;  Location: [facility];  Service: Gastroenterology;;   ROBLEY MEYER INJECTION  01/08/2023   Procedure: SUBMUCOSAL LIFTING INJECTION;  Surgeon: Wilhelmenia Aloha Raddle., MD;  Location: [facility];  Service: Gastroenterology;;    Current Outpatient Medications on File Prior to Visit  Medication Sig Dispense Refill   ACCU-CHEK GUIDE test strip TEST ONCE DAILY 100 strip 0   Accu-Chek Softclix Lancets lancets TEST ONCE DAILY 100 each 1   acetaminophen  (TYLENOL ) 500 MG tablet Take 1,000 mg by mouth every 6 (six) hours as needed.     albuterol  (VENTOLIN  HFA) 108 (90 Base) MCG/ACT inhaler Inhale 2 puffs into the lungs every 6 (six) hours as needed (bronchitis/Asthma). 18 g 0   atorvastatin  (LIPITOR) 10 MG tablet Take 1 tablet (10 mg total) by mouth at bedtime. 90 tablet 1   Biotin w/ Vitamins C & E (HAIR SKIN & NAILS GUMMIES PO) Take 2 Pieces of gum by mouth daily.     Blood Glucose Monitoring Suppl DEVI 1 each by Does not apply route daily. May substitute to any manufacturer covered by patient's insurance. 1 each 0   Carboxymethylcellul-Glycerin (CLEAR EYES FOR DRY EYES OP) Apply to eye as needed.     cyclobenzaprine  (FLEXERIL ) 10 MG tablet Take 1 tablet (10 mg total) by mouth 2 (two) times daily as needed for up to 14 doses for muscle spasms. 14 tablet 0   ibuprofen  (ADVIL ) 200 MG tablet Take 200-600 mg by mouth every 6 (six) hours as needed.      Multiple Vitamin (MULTIVITAMIN WITH MINERALS) TABS tablet Take 1 tablet by mouth daily.     prednisoLONE acetate (PRED FORTE) 1 % ophthalmic suspension Place 1 drop into the left eye daily.     sertraline  (ZOLOFT ) 25 MG tablet Take 1 tablet (25 mg total) by mouth daily. 90 tablet 1   No current facility-administered medications on file prior to visit.    Allergies  Allergen Reactions   Latex Itching and Rash   Metformin  And Related Other (See Comments)    Diarrhea       PE Today's Vitals   04/06/24 0930  BP: 122/80  Pulse: 92  Temp: 98.2 F (36.8 C)  TempSrc: Oral  SpO2: 99%  Weight: 165 lb (74.8 kg)    Body mass index is 25.84 kg/m.  Physical  Exam Vitals reviewed.  Constitutional:      General: She is not in acute distress.    Appearance: Normal appearance.  HENT:     Head: Normocephalic and atraumatic.     Nose: Nose normal.  Eyes:     Extraocular Movements: Extraocular movements intact.     Conjunctiva/sclera: Conjunctivae normal.  Pulmonary:     Effort: Pulmonary effort is normal.  Musculoskeletal:        General: Normal range of motion.     Cervical back: Normal range of motion.  Neurological:     General: No focal deficit present.     Mental Status: She is alert.  Psychiatric:        Mood and Affect: Mood normal.        Behavior: Behavior normal.      Assessment and Plan:        Endometrial thickening on ultrasound  Submucous uterine fibroid   Normal postop exam.  Pathology reviewed with patient. Cleared to return to work. All questions answered.  RTO for annual exam with Dr. Nikki.   Vera LULLA Pa, MD

## 2024-04-10 NOTE — Progress Notes (Signed)
 Lab results have been discussed.   Verbalized understanding? Yes  Are there any questions? No

## 2024-05-11 ENCOUNTER — Ambulatory Visit: Admitting: Family Medicine

## 2024-05-11 ENCOUNTER — Encounter: Payer: Self-pay | Admitting: Family Medicine

## 2024-05-11 VITALS — BP 110/88 | HR 83 | Temp 99.0°F | Resp 13 | Ht 67.0 in | Wt 168.6 lb

## 2024-05-11 DIAGNOSIS — F17210 Nicotine dependence, cigarettes, uncomplicated: Secondary | ICD-10-CM

## 2024-05-11 DIAGNOSIS — F418 Other specified anxiety disorders: Secondary | ICD-10-CM

## 2024-05-11 DIAGNOSIS — Z122 Encounter for screening for malignant neoplasm of respiratory organs: Secondary | ICD-10-CM | POA: Diagnosis not present

## 2024-05-11 DIAGNOSIS — J45909 Unspecified asthma, uncomplicated: Secondary | ICD-10-CM | POA: Diagnosis not present

## 2024-05-11 DIAGNOSIS — R053 Chronic cough: Secondary | ICD-10-CM

## 2024-05-11 MED ORDER — UMECLIDINIUM BROMIDE 62.5 MCG/ACT IN AEPB: 1.0000 | INHALATION_SPRAY | Freq: Every day | RESPIRATORY_TRACT | 3 refills | Status: AC

## 2024-05-11 NOTE — Progress Notes (Signed)
 Subjective:  Patient ID: Brandi Cooke, female    DOB: 1966-02-14  Age: 58 y.o. MRN: [id number]  CC:  Chief Complaint  Patient presents with   Anxiety    Doing a lot better.    Cough    Lingering cough. Ever since she had her flu shot in April     HPI [name] presents for   Anxiety with depression Follow-up from September 3 visit.  Decreased control at that time.  I stressed the importance of meeting with a therapist and a handout was given on the management of anxiety.  She had been on sertraline  but inconsistent dosing, discussed importance of consistent dosing and clarified that she did not have true side effects with that med. Since last visit she states that she has been doing a lot better. September was a hard month with mom passing during that month in past. Taking sertraline  daily. Sleeping ok. Mood better. Did not meet with therapist - declines at this time.     05/11/2024    9:22 AM 04/01/2024   10:53 AM 12/26/2023   10:40 AM 09/26/2023   11:54 AM  GAD 7 : Generalized Anxiety Score  Nervous, Anxious, on Edge 0 3 2 1   Control/stop worrying 0 3 2 3   Worry too much - different things 1 3 2 3   Trouble relaxing 1 2 2  0  Restless 0 3 1 2   Easily annoyed or irritable 1 3 2 2   Afraid - awful might happen 1 2 0 0  Total GAD 7 Score 4 19 11 11   Anxiety Difficulty Somewhat difficult Somewhat difficult Somewhat difficult Not difficult at all   Cough: As above, has noticed since April.  She is a smoker, cessation has been discussed previously.  Smokes half to 1 pack/day for the past 40 years, half pack per day on average.  She has experienced asthma with bronchitis, albuterol  infrequently.  Not ready to quit when last discussed September 3. Cough most days. Some wheeze at times. Using albuterol  few times per week, not daily, no PND or nighttime need of albuterol . Feels better with albuterol .  Would like to consider lung cancer screening.  Clear plhlegm with cough at  times. Only dyspneic with cough at times. No fevers. Same cough past few months.  No recent changes.  Reports recurrent bronchitis in past.   MMRC scale - only dyspnea with strenuous activity, no hospitalizations.    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   Anxiety    Neuroendocrine tumor (HCC) 2024   Granular cell tumor 12/07/2013   Fibroid    Bell's palsy    Glaucoma    Past Medical History:  Diagnosis Date   Anemia    Anxiety    Asthma 11/2023   followed by pcp   Depression    Endometrial thickening on ultrasound    Glaucoma, left eye    s/p  left eye glaucoma valve insertion 07-26-2022   H/O Bell's palsy    03-23-2024  pt stated approx 2009 ,  left side ,  stated intermittantly now face may droop on left side , happens more when needing rest   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 Cornett, MD;  Location: [facility];  Service: General;  Laterality: Left;   COLONOSCOPY  10/05/2022   dr eda   DILATATION & CURRETTAGE/HYSTEROSCOPY WITH RESECTOCOPE N/A 03/24/2024   Procedure: DILATATION & CURETTAGE/HYSTEROSCOPY;   Surgeon: Dallie Vera GAILS, MD;  Location: [facility] OR;  Service: Gynecology;  Laterality: N/A;  MYOSURE   ENDOSCOPIC MUCOSAL RESECTION N/A 01/08/2023   Procedure: ENDOSCOPIC MUCOSAL RESECTION;  Surgeon: Wilhelmenia Aloha Raddle., MD;  Location: [facility];  Service: Gastroenterology;  Laterality: N/A;   EUS N/A 01/08/2023   Procedure: LOWER ENDOSCOPIC ULTRASOUND (EUS);  Surgeon: Wilhelmenia Aloha Raddle., MD;  Location: [facility];  Service: Gastroenterology;  Laterality: N/A;   EUS N/A 01/16/2024   Procedure: ULTRASOUND, LOWER GI TRACT, ENDOSCOPIC;  Surgeon: Wilhelmenia Aloha Raddle., MD;  Location: [facility];  Service: Gastroenterology;  Laterality: N/A;   EXPLORATORY LAPAROTOMY  11/09/2005   @[facility]  by dr r. fontaine;   Myomectomy   FLEXIBLE SIGMOIDOSCOPY N/A 01/08/2023   Procedure: ENID MORIN;  Surgeon: Wilhelmenia Aloha Raddle., MD;  Location: [facility];  Service: Gastroenterology;  Laterality: N/A;   FLEXIBLE SIGMOIDOSCOPY N/A 01/16/2024   Procedure: MORIN ENID;  Surgeon: Wilhelmenia Aloha Raddle., MD;  Location: [facility];  Service: Gastroenterology;  Laterality: N/A;   GLAUCOMA VALVE INSERTION Left 07/26/2022   HEMOSTASIS CLIP PLACEMENT  01/08/2023   Procedure: HEMOSTASIS CLIP PLACEMENT;  Surgeon: Wilhelmenia Aloha Raddle., MD;  Location: [facility];  Service: Gastroenterology;;   HYSTEROSCOPY WITH MYOMECTOMY  01/02/2001   @[facility]  by dr t. rockney NAIL RESECTION  03/24/2024   Procedure: NAIL RESECTION;  Surgeon: Dallie Vera GAILS, MD;  Location[facility] OR;  Service: Gynecology;;   POLYPECTOMY  01/16/2024   Procedure: POLYPECTOMY, INTESTINE;  Surgeon: Wilhelmenia Aloha Raddle., MD;  Location: [facility];  Service: Gastroenterology;;   SUBMUCOSAL LIFTING INJECTION  01/08/2023   Procedure: SUBMUCOSAL LIFTING INJECTION;  Surgeon: Wilhelmenia Aloha Raddle., MD;  Location: [facility];  Service: Gastroenterology;;   Allergies  Allergen Reactions   Latex Itching and Rash    Metformin  And Related Other (See Comments)    Diarrhea    Prior to Admission medications   Medication Sig Start Date End Date Taking? Authorizing Provider  ACCU-CHEK GUIDE test strip TEST ONCE DAILY 02/04/23  Yes Levora Reyes SAUNDERS, MD  Accu-Chek Softclix Lancets lancets TEST ONCE DAILY 03/14/23  Yes Levora Reyes SAUNDERS, MD  acetaminophen  (TYLENOL ) 500 MG tablet Take 1,000 mg by mouth every 6 (six) hours as needed.   Yes [provider]  albuterol  (VENTOLIN  HFA) 108 (90 Base) MCG/ACT inhaler Inhale 2 puffs into the lungs every 6 (six) hours as needed (bronchitis/Asthma). 12/26/23  Yes Levora Reyes SAUNDERS, MD  atorvastatin  (LIPITOR) 10 MG tablet Take 1 tablet (10 mg total) by mouth at bedtime. 04/01/24  Yes Levora Reyes SAUNDERS, MD  Blood Glucose Monitoring Suppl DEVI 1 each by Does not apply route daily. May  substitute to any manufacturer covered by patient's insurance. 11/08/22  Yes Levora Reyes SAUNDERS, MD  Carboxymethylcellul-Glycerin (CLEAR EYES FOR DRY EYES OP) Apply to eye as needed.   Yes [provider]  cyclobenzaprine  (FLEXERIL ) 10 MG tablet Take 1 tablet (10 mg total) by mouth 2 (two) times daily as needed for up to 14 doses for muscle spasms. 10/12/22  Yes Trifan, Donnice PARAS, MD  ibuprofen  (ADVIL ) 200 MG tablet Take 200-600 mg by mouth every 6 (six) hours as needed.   Yes [provider]  Multiple Vitamin (MULTIVITAMIN WITH MINERALS) TABS tablet Take 1 tablet by mouth daily.   Yes [provider]  prednisoLONE acetate (PRED FORTE) 1 % ophthalmic suspension Place 1 drop into the left eye daily. 11/09/22  Yes [provider]  sertraline  (ZOLOFT ) 25 MG tablet Take 1 tablet (25 mg total) by mouth daily. 04/01/24  Yes Levora Reyes SAUNDERS, MD  Biotin w/ Vitamins C & E (HAIR SKIN & NAILS GUMMIES PO) Take 2 Pieces of gum by mouth daily. Patient not taking: Reported on 05/11/2024    [provider]   Social History   Socioeconomic History   Marital status:  Single    Spouse name: Not on file   Number of children: Not on file   Years of education: Not on file   Highest education level: Not on file  Occupational History   Not on file  Tobacco Use   Smoking status: Every Day    Current packs/day: 0.50    Types: Cigarettes   Smokeless tobacco: Never   Tobacco comments:    03-23-2024  pt stated smokes 1 ppd ,  started age 61  Vaping Use   Vaping status: Never Used  Substance and Sexual Activity   Alcohol use: Not Currently    Comment: occasional   Drug use: Never   Sexual activity: Not Currently    Birth control/protection: Post-menopausal    Comment: 1st intercourse 58 yo-Fewer than 5 partners  Other Topics Concern   Not on file  Social History Narrative   Not on file   Social Drivers of Health   Financial Resource Strain: Not on file  Food Insecurity: No Food Insecurity (09/18/2022)   Hunger Vital Sign    Worried About Running Out of Food in the Last Year: Never true    Ran Out of Food in the Last Year: Never true  Transportation Needs: No Transportation Needs (09/18/2022)   PRAPARE - Transportation    Lack of Transportation (Medical): No    Lack of Transportation (Non-Medical): No  Physical Activity: Not on file  Stress: Not on file  Social Connections: Not on file  Intimate Partner Violence: Not on file    Review of Systems   Objective:   Vitals:   05/11/24 0923  BP: 110/88  Pulse: 83  Resp: 13  Temp: 99 F (37.2 C)  TempSrc: Temporal  SpO2: 97%  Weight: 168 lb 9.6 oz (76.5 kg)  Height: 5' 7 (1.702 m)     Physical Exam Vitals reviewed.  Constitutional:      Appearance: Normal appearance. She is well-developed.  HENT:     Head: Normocephalic and atraumatic.  Eyes:     Conjunctiva/sclera: Conjunctivae normal.     Pupils: Pupils are equal, round, and reactive to light.  Neck:     Vascular: No carotid bruit.  Cardiovascular:     Rate and Rhythm: Normal rate and regular rhythm.     Heart sounds:  Normal heart sounds.  Pulmonary:     Effort: Pulmonary effort is normal. No respiratory distress.     Breath sounds: Normal breath sounds. No stridor. No wheezing, rhonchi or rales.     Comments: Clear Abdominal:     Palpations: Abdomen is soft. There is no pulsatile mass.     Tenderness: There is no abdominal tenderness.  Musculoskeletal:     Right lower leg: No edema.     Left lower leg: No edema.  Skin:    General: Skin is warm and dry.  Neurological:     Mental Status: She is alert and oriented to person, place, and time.  Psychiatric:        Mood and Affect: Mood normal.        Behavior: Behavior normal.        Assessment & Plan:  SHAYNNA HUSBY is a 58 y.o. female . Anxiety with depression  - Improved, will continue same dose sertraline , still given option for therapy, phone numbers provided previously, additional numbers can be provided if needed.  Cigarette nicotine dependence without complication - Plan: DG Chest 2 View, umeclidinium bromide (INCRUSE ELLIPTA) 62.5 MCG/ACT AEPB Asthma, unspecified asthma severity, unspecified whether complicated, unspecified whether persistent - Plan: DG Chest 2 View Encounter for screening for lung cancer - Plan: Ambulatory Referral Lung Cancer Screening [facility] Chronic cough - Plan: DG Chest 2 View, umeclidinium bromide (INCRUSE ELLIPTA) 62.5 MCG/ACT AEPB  - Chronic cough as above.  Lungs were clear on exam.  Albuterol  few times per week with some improvement.  Does report history of chronic bronchitis and with her smoking history suspicious for COPD versus combined COPD and asthma.  If COPD, then Gold stage A based on symptoms.  Will try starting LAMA with Incruse Ellipta once per day for now, continue albuterol  as needed for breakthrough symptoms and 6-week recheck.  Chest x-ray ordered and also referred to lung cancer screening with her smoking history.  If no change in symptoms with Incruse, then would consider inhaled  corticosteroid.  Advised patient to let me know.  RTC precautions given.  Meds ordered this encounter  Medications   umeclidinium bromide (INCRUSE ELLIPTA) 62.5 MCG/ACT AEPB    Sig: Inhale 1 puff into the lungs daily.    Dispense:  30 each    Refill:  3   Patient Instructions  Glad to hear that anxiety symptoms have improved.  No change in sertraline  dose for now.  It still may be worthwhile to meet with a therapist at some point, especially if any worsening symptoms.  Let me know if you need additional numbers.  Sorry to hear that you are still coughing for the past few months.  As we discussed that could be asthma but with your smoking history could also be COPD which can cause some chronic cough and chronic bronchitis.  I think it would be reasonable to try an inhaler to treat possible COPD, and have sent that to your pharmacy, 1 puff/day.  If that does not help in the next week, let me know and we can switch gears to a different inhaler that may treat asthma more effectively.  Please have x-ray performed at the [facility] location below.  I will also refer you to lung cancer screening clinic to discuss the CT scan.  Please follow-up with me in 6 weeks to recheck your cough, sooner if any new or worsening symptoms.  Take care!  [facility] or xray: Walk in  8:30-4:30 during weekdays, no appointment needed [address]  [place], [place] [address]     Signed,   Reyes Pines, MD [facility], [facility] 05/11/24 9:53 AM

## 2024-05-11 NOTE — Patient Instructions (Signed)
 Glad to hear that anxiety symptoms have improved.  No change in sertraline  dose for now.  It still may be worthwhile to meet with a therapist at some point, especially if any worsening symptoms.  Let me know if you need additional numbers.  Sorry to hear that you are still coughing for the past few months.  As we discussed that could be asthma but with your smoking history could also be COPD which can cause some chronic cough and chronic bronchitis.  I think it would be reasonable to try an inhaler to treat possible COPD, and have sent that to your pharmacy, 1 puff/day.  If that does not help in the next week, let me know and we can switch gears to a different inhaler that may treat asthma more effectively.  Please have x-ray performed at the [facility] location below.  I will also refer you to lung cancer screening clinic to discuss the CT scan.  Please follow-up with me in 6 weeks to recheck your cough, sooner if any new or worsening symptoms.  Take care!  [facility] or xray: Walk in 8:30-4:30 during weekdays, no appointment needed [address]  [place], [place] [address]

## 2024-06-01 ENCOUNTER — Other Ambulatory Visit: Payer: Self-pay | Admitting: *Deleted

## 2024-06-01 ENCOUNTER — Telehealth: Payer: Self-pay | Admitting: *Deleted

## 2024-06-01 DIAGNOSIS — F17210 Nicotine dependence, cigarettes, uncomplicated: Secondary | ICD-10-CM

## 2024-06-01 DIAGNOSIS — Z87891 Personal history of nicotine dependence: Secondary | ICD-10-CM

## 2024-06-01 DIAGNOSIS — Z122 Encounter for screening for malignant neoplasm of respiratory organs: Secondary | ICD-10-CM

## 2024-06-01 NOTE — Telephone Encounter (Signed)
 Lung Cancer Screening Narrative/Criteria Questionnaire (Cigarette Smokers Only- No Cigars/Pipes/vapes)   Brandi Cooke   SDMV:06/08/24 9:30- [name]                                           [birth year]              LDCT: 06/11/24 9:30- [facility]    58 y.o.   Phone: [phone]  Lung Screening Narrative (confirm age 58-77 yrs Medicare / 50-80 yrs Private pay insurance)   Insurance information:[employer]   Referring Provider:Greene   This screening involves an initial phone call with a team member from our program. It is called a shared decision making visit. The initial meeting is required by insurance and Medicare to make sure you understand the program. This appointment takes about 15-20 minutes to complete. The CT scan will completed at a separate date/time. This scan takes about 5-10 minutes to complete and you may eat and drink before and after the scan.  Criteria questions for Lung Cancer Screening:   Are you a current or former smoker? Current Age began smoking: 38    If you are a former smoker, what year did you quit smoking? (within 15 yrs)   To calculate your smoking history, I need an accurate estimate of how many packs of cigarettes you smoked per day and for how many years. (Not just the number of PPD you are now smoking)   Years smoking 43 x Packs per day 1/2-3/4 = Pack years 28   (at least 20 pack yrs)   (Make sure they understand that we need to know how much they have smoked in the past, not just the number of PPD they are smoking now)  Do you have a personal history of cancer?  No    Do you have a family history of cancer? Yes  (cancer type and and relative) Aunt (?) Uncle (?)  Are you coughing up blood?  No  Have you had unexplained weight loss of 15 lbs or more in the last 6 months? No  It looks like you meet all criteria.     Additional information: N/A

## 2024-06-08 ENCOUNTER — Encounter: Payer: Self-pay | Admitting: Adult Health

## 2024-06-08 ENCOUNTER — Ambulatory Visit (INDEPENDENT_AMBULATORY_CARE_PROVIDER_SITE_OTHER): Admitting: Adult Health

## 2024-06-08 DIAGNOSIS — F17210 Nicotine dependence, cigarettes, uncomplicated: Secondary | ICD-10-CM | POA: Diagnosis not present

## 2024-06-08 NOTE — Patient Instructions (Signed)

## 2024-06-08 NOTE — Progress Notes (Signed)
  Virtual Visit via Telephone Note  I connected with Brandi Cooke , 06/08/24 9:34 AM by a telemedicine application and verified that I am speaking with the correct person using two identifiers.  Location: Patient: home Provider: home   I discussed the limitations of evaluation and management by telemedicine and the availability of in person appointments. The patient expressed understanding and agreed to proceed.   Shared Decision Making Visit Lung Cancer Screening Program [phone])   Eligibility: 58 y.o. Pack Years Smoking History Calculation = 28 pack years  (# packs/per year x # years smoked) Recent History of coughing up blood  no Unexplained weight loss? no ( >Than 15 pounds within the last 6 months ) Prior History Lung / other cancer no (Diagnosis within the last 5 years already requiring surveillance chest CT Scans). Smoking Status Current Smoker  Visit Components: Discussion included one or more decision making aids. YES Discussion included risk/benefits of screening. YES Discussion included potential follow up diagnostic testing for abnormal scans. YES Discussion included meaning and risk of over diagnosis. YES Discussion included meaning and risk of False Positives. YES Discussion included meaning of total radiation exposure. YES  Counseling Included: Importance of adherence to annual lung cancer LDCT screening. YES Impact of comorbidities on ability to participate in the program. YES Ability and willingness to under diagnostic treatment. YES  Smoking Cessation Counseling: Current Smokers:  Discussed importance of smoking cessation. yes Information about tobacco cessation classes and interventions provided to patient. yes Patient provided with ticket for LDCT Scan. yes Symptomatic Patient. NO Diagnosis Code: Tobacco Use Z72.0 Asymptomatic Patient yes  Counseling - 4 minutes of smoking cessation counseling (CT Chest Lung Cancer Screening Low Dose W/O CM)  [id number]  Z12.2-Screening of respiratory organs Z87.891-Personal history of nicotine dependence   [name] 06/08/24

## 2024-06-11 ENCOUNTER — Ambulatory Visit (HOSPITAL_COMMUNITY)
Admission: RE | Admit: 2024-06-11 | Discharge: 2024-06-11 | Disposition: A | Discharge status: Home / Self Care | Source: Ambulatory Visit | Attending: Acute Care | Admitting: Acute Care

## 2024-06-11 DIAGNOSIS — Z122 Encounter for screening for malignant neoplasm of respiratory organs: Secondary | ICD-10-CM | POA: Diagnosis present

## 2024-06-11 DIAGNOSIS — Z87891 Personal history of nicotine dependence: Secondary | ICD-10-CM | POA: Insufficient documentation

## 2024-06-11 DIAGNOSIS — F17210 Nicotine dependence, cigarettes, uncomplicated: Secondary | ICD-10-CM | POA: Insufficient documentation

## 2024-06-18 ENCOUNTER — Other Ambulatory Visit: Payer: Self-pay

## 2024-06-18 DIAGNOSIS — Z122 Encounter for screening for malignant neoplasm of respiratory organs: Secondary | ICD-10-CM

## 2024-06-18 DIAGNOSIS — F17210 Nicotine dependence, cigarettes, uncomplicated: Secondary | ICD-10-CM

## 2024-06-18 DIAGNOSIS — Z87891 Personal history of nicotine dependence: Secondary | ICD-10-CM

## 2024-07-02 ENCOUNTER — Ambulatory Visit: Admitting: Family Medicine

## 2024-07-02 ENCOUNTER — Encounter: Payer: Self-pay | Admitting: Family Medicine

## 2024-07-02 VITALS — BP 128/74 | HR 86 | Temp 98.7°F | Ht 67.0 in | Wt 164.2 lb

## 2024-07-02 DIAGNOSIS — J45909 Unspecified asthma, uncomplicated: Secondary | ICD-10-CM | POA: Diagnosis not present

## 2024-07-02 DIAGNOSIS — L659 Nonscarring hair loss, unspecified: Secondary | ICD-10-CM | POA: Diagnosis not present

## 2024-07-02 DIAGNOSIS — R0982 Postnasal drip: Secondary | ICD-10-CM

## 2024-07-02 DIAGNOSIS — R490 Dysphonia: Secondary | ICD-10-CM

## 2024-07-02 DIAGNOSIS — L989 Disorder of the skin and subcutaneous tissue, unspecified: Secondary | ICD-10-CM

## 2024-07-02 DIAGNOSIS — R052 Subacute cough: Secondary | ICD-10-CM

## 2024-07-02 LAB — TSH: TSH: 3.64 u[IU]/mL (ref 0.35–5.50)

## 2024-07-02 MED ORDER — FLUTICASONE PROPIONATE 50 MCG/ACT NA SUSP: 1.0000 | Freq: Every day | NASAL | 6 refills | Status: AC

## 2024-07-02 NOTE — Progress Notes (Signed)
 Subjective:  Patient ID: Brandi Cooke, female    DOB: [birth year]  Age: 58 y.o. MRN: [id number]  CC:  Chief Complaint  Patient presents with   Cough    6 week follow up. Patient reports cough is okay. Not coughing as much   Ankle Pain    Patient was bit by a wasp or a bee in August on the inside of left ankle    HPI Brandi Cooke presents for   Cough, asthma, nicotine dependence Discussed October 13.  Chronic cough, lungs clear.  Albuterol  few times per week with some improvement at that time.  Question asthma versus underlying COPD with her smoking history.  She was started on Incruse Ellipta , albuterol  as needed for breakthrough symptoms, referred for lung cancer screening program, and chest x-ray was ordered.  She did not have chest x-ray, but did have a CT chest on 06/11/2024, lung RADS 2, benign, repeat in 12 months.Emphysema noted.  Smoking-related respiratory bronchiolitis noted.  3.75mm right upper lobe nodule.  No suspicious nodules. Cough has improved with use of Incruse, using daily. Albuterol  once per week. Slight nasal drainage, some hoarseness with drainage.    Ankle pain: Left ankle sore since August - stung by bee or wasp. Sore since that time. Sore at bump on skin only. Rubs against sneaker. Has tried to pick at area. Sore. Darkened area.   Hair loss: Losing hair past few months, did have braiding of hair last year - thinning out since then. Wears wig.   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   Anxiety    Neuroendocrine tumor (HCC) 2024   Granular cell tumor 12/07/2013   Fibroid    Bell's palsy    Glaucoma    Past Medical History:  Diagnosis Date   Anemia    Anxiety     Asthma 11/2023   followed by pcp   Depression    Endometrial thickening on ultrasound    Glaucoma, left eye    s/p  left eye glaucoma valve insertion 07-26-2022   H/O Bell's palsy    03-23-2024  pt stated approx 2009 ,  left side ,  stated intermittantly now face may droop on left side , happens more when needing rest   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 Cornett, MD;  Location: [facility];  Service: General;  Laterality: Left;   COLONOSCOPY  10/05/2022   dr eda   DILATATION & CURRETTAGE/HYSTEROSCOPY WITH RESECTOCOPE N/A 03/24/2024   Procedure: DILATATION &  CURETTAGE/HYSTEROSCOPY;  Surgeon: Dallie Vera GAILS, MD;  Location: [facility] OR;  Service: Gynecology;  Laterality: N/A;  MYOSURE   ENDOSCOPIC MUCOSAL RESECTION N/A 01/08/2023   Procedure: ENDOSCOPIC MUCOSAL RESECTION;  Surgeon: Wilhelmenia Aloha Raddle., MD;  Location: [facility];  Service: Gastroenterology;  Laterality: N/A;   EUS N/A 01/08/2023   Procedure: LOWER ENDOSCOPIC ULTRASOUND (EUS);  Surgeon: Wilhelmenia Aloha Raddle., MD;  Location: [facility];  Service: Gastroenterology;  Laterality: N/A;   EUS N/A 01/16/2024   Procedure: ULTRASOUND, LOWER GI TRACT, ENDOSCOPIC;  Surgeon: Wilhelmenia Aloha Raddle., MD;  Location: [facility];  Service: Gastroenterology;  Laterality: N/A;    EXPLORATORY LAPAROTOMY  11/09/2005   @[facility]  by dr r. fontaine;   Myomectomy   FLEXIBLE SIGMOIDOSCOPY N/A 01/08/2023   Procedure: ENID MORIN;  Surgeon: Wilhelmenia Aloha Raddle., MD;  Location: [facility];  Service: Gastroenterology;  Laterality: N/A;   FLEXIBLE SIGMOIDOSCOPY N/A 01/16/2024   Procedure: MORIN ENID;  Surgeon: Wilhelmenia Aloha Raddle., MD;  Location: [facility];  Service: Gastroenterology;  Laterality: N/A;   GLAUCOMA VALVE INSERTION Left 07/26/2022   HEMOSTASIS CLIP PLACEMENT  01/08/2023   Procedure: HEMOSTASIS CLIP PLACEMENT;  Surgeon: Wilhelmenia Aloha Raddle., MD;  Location: [facility];  Service: Gastroenterology;;   HYSTEROSCOPY WITH MYOMECTOMY  01/02/2001   @[facility]  by dr t. rockney NAIL RESECTION  03/24/2024   Procedure: NAIL RESECTION;  Surgeon: Dallie Vera GAILS, MD;  Location: [facility] OR;  Service: Gynecology;;   POLYPECTOMY  01/16/2024   Procedure: POLYPECTOMY, INTESTINE;  Surgeon: Wilhelmenia Aloha Raddle., MD;  Location: [facility];  Service: Gastroenterology;;   SUBMUCOSAL LIFTING INJECTION  01/08/2023   Procedure: SUBMUCOSAL LIFTING INJECTION;  Surgeon: Wilhelmenia Aloha Raddle., MD;  Location: [facility];  Service: Gastroenterology;;   Allergies  Allergen Reactions   Latex Itching and Rash   Metformin  And Related Other (See Comments)    Diarrhea    Prior to Admission medications   Medication Sig Start Date End Date Taking? Authorizing Provider  ACCU-CHEK GUIDE test strip TEST ONCE DAILY 02/04/23  Yes Levora Reyes SAUNDERS, MD  Accu-Chek Softclix Lancets lancets TEST ONCE DAILY 03/14/23  Yes Levora Reyes SAUNDERS, MD  acetaminophen  (TYLENOL ) 500 MG tablet Take 1,000 mg by mouth every 6 (six) hours as needed.   Yes [provider]  albuterol  (VENTOLIN  HFA) 108 (90 Base) MCG/ACT inhaler Inhale 2 puffs into the lungs every 6 (six) hours as needed (bronchitis/Asthma). 12/26/23  Yes Levora Reyes SAUNDERS, MD  atorvastatin  (LIPITOR) 10 MG tablet Take  1 tablet (10 mg total) by mouth at bedtime. 04/01/24  Yes Levora Reyes SAUNDERS, MD  Blood Glucose Monitoring Suppl DEVI 1 each by Does not apply route daily. May substitute to any manufacturer covered by patient's insurance. 11/08/22  Yes Levora Reyes SAUNDERS, MD  Carboxymethylcellul-Glycerin (CLEAR EYES FOR DRY EYES OP) Apply to eye as needed.   Yes [provider]  cyclobenzaprine  (FLEXERIL ) 10 MG tablet Take 1 tablet (10 mg total) by mouth 2 (two) times daily as needed for up to 14 doses for muscle spasms. 10/12/22  Yes Trifan, Donnice PARAS, MD  ibuprofen  (ADVIL ) 200 MG tablet Take 200-600 mg by mouth every 6 (six) hours as needed.   Yes [provider]  Multiple Vitamin (MULTIVITAMIN WITH MINERALS) TABS tablet Take 1 tablet by mouth daily.   Yes [provider]  prednisoLONE acetate (PRED FORTE) 1 % ophthalmic suspension Place 1 drop into the left eye daily. 11/09/22  Yes [provider]  sertraline  (ZOLOFT ) 25 MG tablet  Take 1 tablet (25 mg total) by mouth daily. 04/01/24  Yes Levora Reyes SAUNDERS, MD  umeclidinium bromide  (INCRUSE ELLIPTA ) 62.5 MCG/ACT AEPB Inhale 1 puff into the lungs daily. 05/11/24  Yes Levora Reyes SAUNDERS, MD  Biotin w/ Vitamins C & E (HAIR SKIN & NAILS GUMMIES PO) Take 2 Pieces of gum by mouth daily. Patient not taking: Reported on 07/02/2024    [provider]   Social History   Socioeconomic History   Marital status: Single    Spouse name: Not on file   Number of children: Not on file   Years of education: Not on file   Highest education level: Not on file  Occupational History   Not on file  Tobacco Use   Smoking status: Every Day    Current packs/day: 0.50    Types: Cigarettes   Smokeless tobacco: Never   Tobacco comments:    03-23-2024  pt stated smokes 1 ppd ,  started age 14  Vaping Use   Vaping status: Never Used  Substance and Sexual Activity   Alcohol use: Not Currently    Comment: occasional   Drug use: Never   Sexual  activity: Not Currently    Birth control/protection: Post-menopausal    Comment: 1st intercourse 58 yo-Fewer than 5 partners  Other Topics Concern   Not on file  Social History Narrative   Not on file   Social Drivers of Health   Financial Resource Strain: Not on file  Food Insecurity: No Food Insecurity (09/18/2022)   Hunger Vital Sign    Worried About Running Out of Food in the Last Year: Never true    Ran Out of Food in the Last Year: Never true  Transportation Needs: No Transportation Needs (09/18/2022)   PRAPARE - Transportation    Lack of Transportation (Medical): No    Lack of Transportation (Non-Medical): No  Physical Activity: Not on file  Stress: Not on file  Social Connections: Not on file  Intimate Partner Violence: Not on file    Review of Systems Per HPI  Objective:   Vitals:   07/02/24 0952  BP: 128/74  Pulse: 86  Temp: 98.7 F (37.1 C)  TempSrc: Temporal  SpO2: 97%  Weight: 164 lb 3.2 oz (74.5 kg)  Height: 5' 7 (1.702 m)     Physical Exam Vitals reviewed.  Constitutional:      Appearance: Normal appearance. She is well-developed.  HENT:     Head: Normocephalic and atraumatic.  Eyes:     Conjunctiva/sclera: Conjunctivae normal.     Pupils: Pupils are equal, round, and reactive to light.  Neck:     Vascular: No carotid bruit.  Cardiovascular:     Rate and Rhythm: Normal rate and regular rhythm.     Heart sounds: Normal heart sounds.  Pulmonary:     Effort: Pulmonary effort is normal.     Breath sounds: Normal breath sounds.  Abdominal:     Palpations: Abdomen is soft. There is no pulsatile mass.     Tenderness: There is no abdominal tenderness.  Musculoskeletal:     Right lower leg: No edema.     Left lower leg: No edema.     Comments: See photos, small hyperpigmented scar tissue, round lesion on left ankle.  No surrounding erythema.  Skin:    General: Skin is warm and dry.  Neurological:     Mental Status: She is alert and oriented  to person, place, and time.  Psychiatric:  Mood and Affect: Mood normal.        Behavior: Behavior normal.         Assessment & Plan:  Brandi Cooke is a 58 y.o. female . Subacute cough Asthma, unspecified asthma severity, unspecified whether complicated, unspecified whether persistent Hoarseness PND  - Cough has improved with Incruse, suspected COPD component.  Has albuterol  if needed for breakthrough symptoms.  Some hoarseness with postnasal drip likely contributing to cough as well, start Flonase.  RTC precautions if not improving in the next few weeks, sooner if worse.  Hair loss - Plan: TSH, Ambulatory referral to Dermatology  - Past 6 months or so, may have been related to previous hair braiding, differential includes alopecia,  check thyroid, refer to Derm.  Skin lesion of left lower extremity - Plan: fluticasone (FLONASE) 50 MCG/ACT nasal spray, Ambulatory referral to Dermatology  - See photo above.  Painful area which appears to be residual scar tissue from excoriation, prior wound.  Refer to dermatology.  Meds ordered this encounter  Medications   fluticasone (FLONASE) 50 MCG/ACT nasal spray    Sig: Place 1-2 sprays into both nostrils daily.    Dispense:  16 g    Refill:  6   Patient Instructions  Try adding flonase once per day for congestion and drainage that may be contributing to cough. Stay on Incruse inhaler consistently, albuterol  only if needed. If cough or hoarseness do not improve in 2 weeks - return for recheck and other possible treatments.       Signed,   Reyes Pines, MD [facility], [facility] 07/02/24 10:27 AM

## 2024-07-02 NOTE — Patient Instructions (Addendum)
 Try adding flonase once per day for congestion and drainage that may be contributing to cough. Stay on Incruse inhaler consistently, albuterol  only if needed. If cough or hoarseness do not improve in 2 weeks - return for recheck and other possible treatments.   I will refer you to dermatology for the area on your ankle and to discuss hair changes.   Take care.

## 2024-07-05 ENCOUNTER — Ambulatory Visit: Payer: Self-pay | Admitting: Family Medicine

## 2024-09-30 ENCOUNTER — Ambulatory Visit: Admitting: Family Medicine

## 2024-10-12 ENCOUNTER — Ambulatory Visit: Admitting: Obstetrics and Gynecology

## 2025-03-10 ENCOUNTER — Ambulatory Visit: Admitting: Physician Assistant
